# Patient Record
Sex: Female | Born: 1950 | Race: White | Hispanic: No | Marital: Married | Smoking: Never smoker
Health system: Southern US, Community
[De-identification: ages and names within clinical notes are randomized; demographics above are authoritative.]

## PROBLEM LIST (undated history)

## (undated) DIAGNOSIS — D126 Benign neoplasm of colon, unspecified: Secondary | ICD-10-CM

## (undated) DIAGNOSIS — N3010 Interstitial cystitis (chronic) without hematuria: Secondary | ICD-10-CM

## (undated) DIAGNOSIS — K2970 Gastritis, unspecified, without bleeding: Secondary | ICD-10-CM

## (undated) DIAGNOSIS — K8020 Calculus of gallbladder without cholecystitis without obstruction: Secondary | ICD-10-CM

## (undated) DIAGNOSIS — H269 Unspecified cataract: Secondary | ICD-10-CM

## (undated) DIAGNOSIS — M549 Dorsalgia, unspecified: Secondary | ICD-10-CM

## (undated) DIAGNOSIS — E039 Hypothyroidism, unspecified: Secondary | ICD-10-CM

## (undated) DIAGNOSIS — R600 Localized edema: Secondary | ICD-10-CM

## (undated) DIAGNOSIS — K449 Diaphragmatic hernia without obstruction or gangrene: Secondary | ICD-10-CM

## (undated) DIAGNOSIS — M810 Age-related osteoporosis without current pathological fracture: Secondary | ICD-10-CM

## (undated) DIAGNOSIS — K0889 Other specified disorders of teeth and supporting structures: Secondary | ICD-10-CM

## (undated) DIAGNOSIS — D649 Anemia, unspecified: Secondary | ICD-10-CM

## (undated) DIAGNOSIS — M1120 Other chondrocalcinosis, unspecified site: Secondary | ICD-10-CM

## (undated) DIAGNOSIS — M1990 Unspecified osteoarthritis, unspecified site: Secondary | ICD-10-CM

## (undated) DIAGNOSIS — E119 Type 2 diabetes mellitus without complications: Secondary | ICD-10-CM

## (undated) DIAGNOSIS — R3915 Urgency of urination: Secondary | ICD-10-CM

## (undated) DIAGNOSIS — K219 Gastro-esophageal reflux disease without esophagitis: Secondary | ICD-10-CM

## (undated) DIAGNOSIS — I10 Essential (primary) hypertension: Secondary | ICD-10-CM

## (undated) DIAGNOSIS — R0602 Shortness of breath: Secondary | ICD-10-CM

## (undated) DIAGNOSIS — T7840XA Allergy, unspecified, initial encounter: Secondary | ICD-10-CM

## (undated) DIAGNOSIS — M2550 Pain in unspecified joint: Secondary | ICD-10-CM

## (undated) DIAGNOSIS — K5903 Drug induced constipation: Secondary | ICD-10-CM

## (undated) DIAGNOSIS — R7303 Prediabetes: Secondary | ICD-10-CM

## (undated) DIAGNOSIS — E785 Hyperlipidemia, unspecified: Secondary | ICD-10-CM

## (undated) DIAGNOSIS — Z87440 Personal history of urinary (tract) infections: Secondary | ICD-10-CM

## (undated) DIAGNOSIS — K589 Irritable bowel syndrome without diarrhea: Secondary | ICD-10-CM

## (undated) DIAGNOSIS — F419 Anxiety disorder, unspecified: Secondary | ICD-10-CM

## (undated) HISTORY — DX: Calculus of gallbladder without cholecystitis without obstruction: K80.20

## (undated) HISTORY — PX: ANKLE SURGERY: SHX546

## (undated) HISTORY — DX: Unspecified osteoarthritis, unspecified site: M19.90

## (undated) HISTORY — DX: Type 2 diabetes mellitus without complications: E11.9

## (undated) HISTORY — PX: EYE SURGERY: SHX253

## (undated) HISTORY — DX: Unspecified cataract: H26.9

## (undated) HISTORY — DX: Localized edema: R60.0

## (undated) HISTORY — DX: Irritable bowel syndrome, unspecified: K58.9

## (undated) HISTORY — DX: Hypothyroidism, unspecified: E03.9

## (undated) HISTORY — DX: Gastro-esophageal reflux disease without esophagitis: K21.9

## (undated) HISTORY — DX: Hyperlipidemia, unspecified: E78.5

## (undated) HISTORY — DX: Anxiety disorder, unspecified: F41.9

## (undated) HISTORY — DX: Pain in unspecified joint: M25.50

## (undated) HISTORY — DX: Diaphragmatic hernia without obstruction or gangrene: K44.9

## (undated) HISTORY — PX: OTHER SURGICAL HISTORY: SHX169

## (undated) HISTORY — DX: Age-related osteoporosis without current pathological fracture: M81.0

## (undated) HISTORY — DX: Benign neoplasm of colon, unspecified: D12.6

## (undated) HISTORY — DX: Essential (primary) hypertension: I10

## (undated) HISTORY — PX: JOINT REPLACEMENT: SHX530

## (undated) HISTORY — DX: Gastritis, unspecified, without bleeding: K29.70

## (undated) HISTORY — PX: COLONOSCOPY W/ POLYPECTOMY: SHX1380

## (undated) HISTORY — DX: Shortness of breath: R06.02

## (undated) HISTORY — DX: Allergy, unspecified, initial encounter: T78.40XA

## (undated) HISTORY — DX: Dorsalgia, unspecified: M54.9

---

## 1994-05-10 HISTORY — PX: BREAST LUMPECTOMY: SHX2

## 1997-10-31 ENCOUNTER — Ambulatory Visit (HOSPITAL_COMMUNITY): Admission: RE | Admit: 1997-10-31 | Discharge: 1997-10-31 | Payer: Self-pay | Admitting: Gastroenterology

## 2012-10-12 ENCOUNTER — Encounter: Payer: BC Managed Care – PPO | Attending: *Deleted | Admitting: *Deleted

## 2012-10-12 ENCOUNTER — Encounter: Payer: Self-pay | Admitting: *Deleted

## 2012-10-12 VITALS — Ht 61.5 in | Wt 276.0 lb

## 2012-10-12 DIAGNOSIS — Z713 Dietary counseling and surveillance: Secondary | ICD-10-CM | POA: Insufficient documentation

## 2012-10-12 DIAGNOSIS — E119 Type 2 diabetes mellitus without complications: Secondary | ICD-10-CM | POA: Insufficient documentation

## 2012-10-12 NOTE — Patient Instructions (Signed)
Goals:  Follow Diabetes Meal Plan as instructed  Eat 3 meals and 2 snacks, every 3-5 hrs  Limit carbohydrate intake to 30-45 grams carbohydrate/meal  Limit carbohydrate intake to 15 grams carbohydrate/snack  Add lean protein foods to meals/snacks  Monitor glucose levels as instructed by your doctor  Aim for 30 mins of physical activity daily  Bring food record and glucose log to your next nutrition visit 

## 2012-10-12 NOTE — Progress Notes (Signed)
  Patient was seen on 10/12/12 for the first of a series of three diabetes self-management courses at the [facility]. The following learning objectives were met by the patient during this course:   Defines the role of glucose and insulin  Identifies type of diabetes and pathophysiology  Defines the diagnostic criteria for diabetes and prediabetes  States the risk factors for Type 2 Diabetes  States the symptoms of Type 2 Diabetes  Defines Type 2 Diabetes treatment goals  Defines Type 2 Diabetes treatment options  States the rationale for glucose monitoring  Identifies A1C, glucose targets, and testing times  Identifies proper sharps disposal  Defines the purpose of a diabetes food plan  Identifies carbohydrate food groups  Defines effects of carbohydrate foods on glucose levels  Identifies carbohydrate choices/grams/food labels  States benefits of physical activity and effect on glucose  Review of suggested activity guidelines  Handouts given during class include:  Type 2 Diabetes: Basics Book  My Food Plan Book  Food and Activity Log    Follow-Up Plan: Attend core 2 and 3 

## 2012-12-12 ENCOUNTER — Encounter: Payer: BC Managed Care – PPO | Attending: *Deleted

## 2012-12-12 DIAGNOSIS — Z713 Dietary counseling and surveillance: Secondary | ICD-10-CM | POA: Insufficient documentation

## 2012-12-12 DIAGNOSIS — E119 Type 2 diabetes mellitus without complications: Secondary | ICD-10-CM | POA: Insufficient documentation

## 2012-12-13 NOTE — Progress Notes (Signed)
Patient was seen on 12/12/12 for the second of a series of three diabetes self-management courses at the [facility]. The following learning objectives were met by the patient during this course:   Explain basic nutrition maintenance and quality assurance  Describe causes, symptoms and treatment of hypoglycemia and hyperglycemia  Explain how to manage diabetes during illness  Describe the importance of good nutrition for health and healthy eating strategies  List strategies to follow meal plan when dining out  Describe the effects of alcohol on glucose and how to use it safely  Describe problem solving skills for day-to-day glucose challenges  Describe strategies to use when treatment plan needs to change  Identify important factors involved in successful weight loss  Describe ways to remain physically active  Describe the impact of regular activity on insulin resistance  Identify current diabetes medications, their action on blood glucose, and [pssible side effects.  Handouts given in class:  Refrigerator magnet for Sick Day Guidelines  [facility] Oral medication/insulin handout  Your patient has identified their diabetes self-care support plan as:  [facility] support group   Follow-Up Plan: Patient will attend the final class of the ADA Diabetes Self-Care Education.   

## 2012-12-26 ENCOUNTER — Ambulatory Visit: Payer: BC Managed Care – PPO

## 2013-01-23 ENCOUNTER — Encounter: Payer: BC Managed Care – PPO | Attending: *Deleted

## 2013-01-23 DIAGNOSIS — E119 Type 2 diabetes mellitus without complications: Secondary | ICD-10-CM | POA: Insufficient documentation

## 2013-01-23 DIAGNOSIS — Z713 Dietary counseling and surveillance: Secondary | ICD-10-CM | POA: Insufficient documentation

## 2013-01-24 NOTE — Progress Notes (Signed)
  Patient was seen on 01/23/13 for the third of a series of three diabetes self-management courses at the [facility]. The following learning objectives were met by the patient during this course:    Describe how diabetes changes over time   Identify diabetes complications and ways to prevent them   Describe strategies that can promote heart health including lowering blood pressure and cholesterol   Describe strategies to lower dietary fat and sodium in the diet   Identify physical activities that benefit cardiovascular health   Evaluate success in meeting personal goal   Describe the belief that they can live successfully with diabetes day to day   Establish 2-3 goals that they will plan to diligently work on until they return for the 29-month follow-up visit  The following handouts were given in class:  Goal setting handout  Class evaluation form  Your patient has established the following 4 month goals for diabetes self-care:  Count carbohydrates at most meal and snacks  Increase activity at least 3 days a week  Test glucose at least 2 X day, 7 days a week  Follow-Up Plan: Patient will attend a 4 month follow-up visit for diabetes self-management education.

## 2013-02-06 ENCOUNTER — Encounter: Payer: Self-pay | Admitting: *Deleted

## 2013-02-06 ENCOUNTER — Encounter: Payer: BC Managed Care – PPO | Admitting: *Deleted

## 2013-02-06 DIAGNOSIS — E119 Type 2 diabetes mellitus without complications: Secondary | ICD-10-CM

## 2013-02-06 NOTE — Progress Notes (Signed)
  Medical Nutrition Therapy:  Appt start time: 1200 end time:  1230.  Assessment:  Primary concerns today: Jessica Good is here for a follow up appointment pertaining to prediabetes.  She attended our 3 Core Class sessions for diabetes self-management education, but she still has questions about meal planning.  She is trying to count her carbohydrates, but she isn't doing it correctly.  She also isn't active any more   MEDICATIONS: see list   DIETARY INTAKE:  Usual eating pattern includes 2-3 meals and 1-3 snacks per day.  Everyday foods include protein, starch, fruit, some vegetables.  Avoided foods include none.    24-hr recall:  B ( AM): peanut butter toast or cheese toast.  1/2 apple; hard boiled eggs  Snk ( AM): maybe apple and peanut butter  L ( PM): greens salad with chicken and balsamic;   Grilled pimento cheese sandwich Snk ( PM): maybe some nabs D ( PM): chicken and rice with peas and corn Snk ( PM): cookies and peanut butter Beverages: water and coffee  Usual physical activity: PT for knee.  Did some pool exercise twice a week.  Hasn't done anything lately  Estimated energy needs: 1600 calories 180 g carbohydrates 120 g protein 44 g fat  Progress Towards Goal(s):  In progress.   Nutritional Diagnosis:  NB-1.6 Limited adherence to nutrition-related recommendations As related to diabetes meal plan.  As evidenced by dietary recall and limited physical activity.    Intervention:  Nutrition counseling provided. Reviewed carb counting and what foods contain carbohydrates.  Built meal plan consisting of 3 carb choice/meal and 1-2/snack.  Reminded Jessica Good of the importance of lean protein with all meals, as well as free vegetables.  Discussed reading food labels for sugar and fiber.   Handouts given during visit include:  My meal plan card  Reading food labels  Carb counting worksheet  Monitoring/Evaluation:  Dietary intake, exercise, BGM, and body weight in 3 month(s).

## 2013-05-22 ENCOUNTER — Ambulatory Visit: Payer: BC Managed Care – PPO | Admitting: *Deleted

## 2014-02-11 ENCOUNTER — Encounter: Payer: Self-pay | Admitting: *Deleted

## 2014-02-13 ENCOUNTER — Encounter: Payer: Self-pay | Admitting: Internal Medicine

## 2014-02-13 ENCOUNTER — Ambulatory Visit (INDEPENDENT_AMBULATORY_CARE_PROVIDER_SITE_OTHER): Payer: BC Managed Care – PPO | Admitting: Internal Medicine

## 2014-02-13 VITALS — BP 136/80 | HR 76 | Ht 61.0 in | Wt 255.4 lb

## 2014-02-13 DIAGNOSIS — K5909 Other constipation: Secondary | ICD-10-CM

## 2014-02-13 DIAGNOSIS — Z1211 Encounter for screening for malignant neoplasm of colon: Secondary | ICD-10-CM

## 2014-02-13 DIAGNOSIS — K219 Gastro-esophageal reflux disease without esophagitis: Secondary | ICD-10-CM

## 2014-02-13 DIAGNOSIS — T402X5A Adverse effect of other opioids, initial encounter: Secondary | ICD-10-CM | POA: Insufficient documentation

## 2014-02-13 DIAGNOSIS — K5903 Drug induced constipation: Secondary | ICD-10-CM

## 2014-02-13 MED ORDER — MOVIPREP 100 G PO SOLR: 1.0000 | Freq: Once | ORAL | Status: DC

## 2014-02-13 MED ORDER — LUBIPROSTONE 24 MCG PO CAPS: 24.0000 ug | ORAL_CAPSULE | Freq: Two times a day (BID) | ORAL | Status: DC

## 2014-02-13 NOTE — Progress Notes (Signed)
Patient ID: Jessica Good, female   DOB: May 26, 1950, 63 y.o.   MRN: [id number] HPI: Jessica Good is a 63 year old female with a past medical history of hypertension, diabetes, anxiety and osteoarthritis who is seen in consultation at the request of Everardo Beals to evaluate constipation. She is here alone today. She reports that over the last 3-4 months she's developed severe constipation. She is having approximately one bowel movement a week which is often hard requiring straining. She describes her stool as "small balls". Prior to that she was having approximately one bowel movement per day. She does report a history of irritable bowel. She is taking oxymorphone extended release 4 osteoarthritis/knee pain which is significant. She's also receiving steroid injections to her knees and is trying to lose weight so that she can have a knee replacement surgery. She's tried over-the-counter stool softeners for constipation without benefit. She's had a colonoscopy though it's been greater than 10 years ago. Denies family history of colorectal cancer though a parent had polyps. She denies rectal bleeding or melena. She does have nausea which she attributes to her medication but no vomiting. She has a history of heartburn related to reflux disease which is well controlled on omeprazole. She denies dysphagia or odynophagia. No weight loss in fact some weight gain. No hepatobiliary complaints. She denies abdominal pain currently though very occasionally less than once a month she'll have left upper pain which is fleeting and short-lived.   Past Medical History  Diagnosis Date  . Diabetes mellitus without complication   . Hypertension   . Anxiety   . Osteoarthritis     Past Surgical History  Procedure Laterality Date  . Ankle surgery      Outpatient Prescriptions Prior to Visit  Medication Sig Dispense Refill  . cholecalciferol (VITAMIN D) 1000 UNITS tablet Take 1,000 Units by mouth daily.      .  cyclobenzaprine (FLEXERIL) 10 MG tablet Take 10 mg by mouth 3 (three) times daily as needed for muscle spasms.      . furosemide (LASIX) 40 MG tablet Take 40 mg by mouth daily.      Marland Kitchen lisinopril (PRINIVIL,ZESTRIL) 20 MG tablet Take 20 mg by mouth daily.      . Multiple Vitamin (MULTIVITAMIN WITH MINERALS) TABS Take 1 tablet by mouth daily.      Marland Kitchen omeprazole (PRILOSEC) 10 MG capsule Take 10 mg by mouth daily.      . ciprofloxacin (CIPRO) 500 MG/5ML (10%) suspension Take by mouth 2 (two) times daily.      . diclofenac (VOLTAREN) 75 MG EC tablet Take 75 mg by mouth 2 (two) times daily.      . traMADol (ULTRAM) 50 MG tablet Take 50 mg by mouth every 6 (six) hours as needed for pain.       No facility-administered medications prior to visit.    Allergies  Allergen Reactions  . Sulfa Antibiotics     Family History  Problem Relation Age of Onset  . Diabetes Maternal Grandmother   . Hypertension Father     History  Substance Use Topics  . Smoking status: Never Smoker   . Smokeless tobacco: Not on file  . Alcohol Use: No    ROS: As per history of present illness, otherwise negative  BP 136/80  Pulse 76  Ht 5\' 1"  (1.549 m)  Wt 255 lb 6.4 oz (115.849 kg)  BMI 48.28 kg/m2  SpO2 97% Constitutional: Well-developed and well-nourished. No distress. HEENT: Normocephalic and atraumatic. Oropharynx  is clear and moist. No oropharyngeal exudate. Conjunctivae are normal.  No scleral icterus. Neck: Neck supple. Trachea midline. Cardiovascular: Normal rate, regular rhythm and intact distal pulses. No M/R/G Pulmonary/chest: Effort normal and breath sounds normal. No wheezing, rales or rhonchi. Abdominal: Soft, obese, nontender, nondistended. Bowel sounds active throughout.  Extremities: no clubbing, cyanosis, or edema Lymphadenopathy: No cervical adenopathy noted. Neurological: Alert and oriented to person place and time. Psychiatric: Normal mood and affect. Behavior is normal.  RELEVANT  LABS  Labs reviewed CMP dated 12/20/2013 within normal limits, CBC within normal limits hemoglobin 13.3, MCV 93.   thyroid panel normal 10/25/2013 A1c 6.3  Records reviewed from primary care  ASSESSMENT/PLAN: 63 year old female with a past medical history of hypertension, diabetes, anxiety and osteoarthritis who is seen in consultation at the request of Everardo Beals to evaluate constipation.   1. Constipation, opioid-induced  -- her constipation is very like her later to chronic narcotic use. We discussed this today and I have recommended lubiprostone 24 mcg twice daily. We discussed this medication including as possible side effect of diarrhea. I asked that she notify me should this occur.she voices understanding.  There are other options if lubiprostone is ineffective. Thyroid function panel checked and within normal limits   2. CRC screening -- she is due for colonoscopy for colorectal cancer screening. This test is recommended and after discussion of the risks and benefits she is agreeable to proceed  3. GERD -- well-controlled once daily PPI. No alarm symptoms. She will continue current dose of omeprazole.

## 2014-02-13 NOTE — Patient Instructions (Signed)
You have been scheduled for a colonoscopy. Please follow written instructions given to you at your visit today.  Please pick up your prep kit at the pharmacy within the next 1-3 days. If you use inhalers (even only as needed), please bring them with you on the day of your procedure. Your physician has requested that you go to www.startemmi.com and enter the access code given to you at your visit today. This web site gives a general overview about your procedure. However, you should still follow specific instructions given to you by our office regarding your preparation for the procedure.  CC:Dr [employer]

## 2014-02-19 ENCOUNTER — Encounter: Payer: Self-pay | Admitting: Internal Medicine

## 2014-03-27 ENCOUNTER — Telehealth: Payer: Self-pay | Admitting: Internal Medicine

## 2014-03-27 NOTE — H&P (Signed)
  [facility] [address] [place], [place] [address] [phone] A Division of [facility]  Ninetta Lights, M.D.   Robert A. Noemi Chapel, M.D.   Faythe Casa, M.D.   Johnny Bridge, M.D.   Almedia Balls, M.D Ernesta Amble. Percell Miller, M.D.  Joseph Pierini, M.D.  Lanier Prude, M.D.    Verner Chol, M.D. Mary L. Fenton Malling, PA-C  Kirstin A. Shepperson, PA-C  Josh Maywood, PA-C [place], [place]   RE: Jessica Good, Jessica Good   [id number]      DOB: November 06, 1950 PROGRESS NOTE: 02-25-14 Reason for visit:  Referral from Dr. Lynann Bologna for right knee arthritis. 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 She has had multiple cortisone shots NSAID's pain medications bracing and viscosupplementation. She has also lost 30 pounds in attempt to improve pain.   Please see associated documentation for this clinic visit for further past medical, family, surgical and social history, review of systems, and exam findings as this was reviewed by me.  EXAMINATION: Well appearing female in no apparent distress. Significant tenderness primarily over the lateral joint line but has tenderness on the medial side as well. She has a roughly 5-10 degree flexion contracture.  She is neurovascularly intact distally. Skin is benign.  IMAGING: X-rays reviewed by me:  2 views demonstrate severe right knee arthritis primarily effecting the lateral compartment.   ASSESSMENT/PLAN: I had a long talk with her about options. Discussed risks benefits and possible complications of total knee arthroplasty and she would like to go forward with this. We will have to work hard on getting her extension and likely do a 10 mm. bone cut initially.  Ernesta Amble.  Percell Miller, M.D.  Electronically verified by Ernesta Amble. Percell Miller, M.D. TDM:kah D 02-25-14 T  02-27-14

## 2014-03-27 NOTE — Telephone Encounter (Signed)
I have spoken to patient and have answered questions. She has found her paperwork that she originally lost so a lot of her questions have already been answered.

## 2014-03-29 ENCOUNTER — Encounter: Payer: Self-pay | Admitting: Internal Medicine

## 2014-03-29 ENCOUNTER — Ambulatory Visit (AMBULATORY_SURGERY_CENTER): Payer: BC Managed Care – PPO | Admitting: Internal Medicine

## 2014-03-29 VITALS — BP 171/80 | HR 66 | Temp 96.6°F | Resp 17 | Ht 60.0 in | Wt 255.0 lb

## 2014-03-29 DIAGNOSIS — D123 Benign neoplasm of transverse colon: Secondary | ICD-10-CM

## 2014-03-29 DIAGNOSIS — Z1211 Encounter for screening for malignant neoplasm of colon: Secondary | ICD-10-CM

## 2014-03-29 MED ORDER — SODIUM CHLORIDE 0.9 % IV SOLN: 500.0000 mL | INTRAVENOUS | Status: DC

## 2014-03-29 NOTE — Progress Notes (Signed)
Called to room to assist during endoscopic procedure.  Patient ID and intended procedure confirmed with present staff. Received instructions for my participation in the procedure from the performing physician.  

## 2014-03-29 NOTE — Op Note (Signed)
[name]  [address] [place], [address]   COLONOSCOPY PROCEDURE REPORT  PATIENT: Jessica Good, Jessica Good  MR#: [id number] BIRTHDATE: [birth year] , 19  yrs. old GENDER: female ENDOSCOPIST: Jerene Bears, MD PROCEDURE DATE:  03/29/2014 PROCEDURE:   Colonoscopy with snare polypectomy First Screening Colonoscopy - Avg.  risk and is 50 yrs.  old or older - No.  Prior Negative Screening - Now for repeat screening. 10 or more years since last screening  History of Adenoma - Now for follow-up colonoscopy & has been > or = to 3 yrs.  N/A  Polyps Removed Today? Yes. ASA CLASS:   Class III INDICATIONS:average risk for colorectal cancer. MEDICATIONS: Monitored anesthesia care and Propofol 240 mg IV  DESCRIPTION OF PROCEDURE:   After the risks benefits and alternatives of the procedure were thoroughly explained, informed consent was obtained.  The digital rectal exam revealed no rectal mass.   The LB [id number]  endoscope was introduced through the anus and advanced to the cecum, which was identified by both the appendix and ileocecal valve. No adverse events experienced. The quality of the prep was good, using MoviPrep  The instrument was then slowly withdrawn as the colon was fully examined.   COLON FINDINGS: Two sessile polyps ranging between 3-55mm in size were found in the transverse colon.   The examination was otherwise normal.  Retroflexed views revealed no abnormalities. The time to cecum=3 minutes 34 seconds.  Withdrawal time=8 minutes 41 seconds. The scope was withdrawn and the procedure completed. COMPLICATIONS: There were no immediate complications.  ENDOSCOPIC IMPRESSION: 1.   Two sessile polyps ranging between 3-38mm in size were found in the transverse colon 2.   The examination was otherwise normal  RECOMMENDATIONS: 1.  Await pathology results 2.  If the polyps removed today are proven to be adenomatous (pre-cancerous) polyps, you will need a repeat  colonoscopy in 5 years.  Otherwise you should continue to follow colorectal cancer screening guidelines for "routine risk" patients with colonoscopy in 10 years.  You will receive a letter within 1-2 weeks with the results of your biopsy as well as final recommendations.  Please call my office if you have not received a letter after 3 weeks.  eSigned:  Jerene Bears, MD 03/29/2014 4:36 PM   cc: The Patient, Dr. Benna Dunks, MD

## 2014-03-29 NOTE — Progress Notes (Signed)
Procedure ends, to recovery, report given and VSS. 

## 2014-03-29 NOTE — Patient Instructions (Signed)
YOU HAD AN ENDOSCOPIC PROCEDURE TODAY AT THE [facility]: Refer to the procedure report that was given to you for any specific questions about what was found during the examination.  If the procedure report does not answer your questions, please call your gastroenterologist to clarify.  If you requested that your care partner not be given the details of your procedure findings, then the procedure report has been included in a sealed envelope for you to review at your convenience later.  YOU SHOULD EXPECT: Some feelings of bloating in the abdomen. Passage of more gas than usual.  Walking can help get rid of the air that was put into your GI tract during the procedure and reduce the bloating. If you had a lower endoscopy (such as a colonoscopy or flexible sigmoidoscopy) you may notice spotting of blood in your stool or on the toilet paper. If you underwent a bowel prep for your procedure, then you may not have a normal bowel movement for a few days.  DIET: Your first meal following the procedure should be a light meal and then it is ok to progress to your normal diet.  A half-sandwich or bowl of soup is an example of a good first meal.  Heavy or fried foods are harder to digest and may make you feel nauseous or bloated.  Likewise meals heavy in dairy and vegetables can cause extra gas to form and this can also increase the bloating.  Drink plenty of fluids but you should avoid alcoholic beverages for 24 hours.  ACTIVITY: Your care partner should take you home directly after the procedure.  You should plan to take it easy, moving slowly for the rest of the day.  You can resume normal activity the day after the procedure however you should NOT DRIVE or use heavy machinery for 24 hours (because of the sedation medicines used during the test).    SYMPTOMS TO REPORT IMMEDIATELY: A gastroenterologist can be reached at any hour.  During normal business hours, 8:30 AM to 5:00 PM Monday through Friday,  call [phone].  After hours and on weekends, please call the GI answering service at [phone] who will take a message and have the physician on call contact you.   Following lower endoscopy (colonoscopy or flexible sigmoidoscopy):  Excessive amounts of blood in the stool  Significant tenderness or worsening of abdominal pains  Swelling of the abdomen that is new, acute  Fever of 100F or higher FOLLOW UP: If any biopsies were taken you will be contacted by phone or by letter within the next 1-3 weeks.  Call your gastroenterologist if you have not heard about the biopsies in 3 weeks.  Our staff will call the home number listed on your records the next business day following your procedure to check on you and address any questions or concerns that you may have at that time regarding the information given to you following your procedure. This is a courtesy call and so if there is no answer at the home number and we have not heard from you through the emergency physician on call, we will assume that you have returned to your regular daily activities without incident.  SIGNATURES/CONFIDENTIALITY: You and/or your care partner have signed paperwork which will be entered into your electronic medical record.  These signatures attest to the fact that that the information above on your After Visit Summary has been reviewed and is understood.  Full responsibility of the confidentiality of this discharge   information lies with you and/or your care-partner.  Polyp information given. 

## 2014-03-30 NOTE — Progress Notes (Signed)
Patient called about rescheduling PAT appointment d/t potential need to go out of town but that she may be able to get back in time for appointment. Give patient number to Carin Hock PAT scheduler also advised patient that due to holiday next week we may or may not be able to reschedule her appointment for PAT and that based off of surgery that we need to get her in for this. Patient verbalized she will call at 08:30 on Monday regarding same.

## 2014-04-01 ENCOUNTER — Inpatient Hospital Stay (HOSPITAL_COMMUNITY)
Admission: RE | Admit: 2014-04-01 | Discharge: 2014-04-01 | Disposition: A | Discharge status: Home / Self Care | Payer: BC Managed Care – PPO | Source: Ambulatory Visit

## 2014-04-01 ENCOUNTER — Telehealth: Payer: Self-pay | Admitting: *Deleted

## 2014-04-01 NOTE — Telephone Encounter (Signed)
  Follow up Call-  Call back number 03/29/2014  Post procedure Call Back phone  # [phone]  Permission to leave phone message Yes    [facility]

## 2014-04-03 ENCOUNTER — Encounter (HOSPITAL_COMMUNITY): Payer: Self-pay

## 2014-04-03 ENCOUNTER — Encounter (HOSPITAL_COMMUNITY)
Admission: RE | Admit: 2014-04-03 | Discharge: 2014-04-03 | Disposition: A | Discharge status: Home / Self Care | Payer: BC Managed Care – PPO | Source: Ambulatory Visit | Attending: Orthopedic Surgery | Admitting: Orthopedic Surgery

## 2014-04-03 DIAGNOSIS — Z01812 Encounter for preprocedural laboratory examination: Secondary | ICD-10-CM | POA: Insufficient documentation

## 2014-04-03 HISTORY — DX: Urgency of urination: R39.15

## 2014-04-03 HISTORY — DX: Drug induced constipation: K59.03

## 2014-04-03 HISTORY — DX: Localized edema: R60.0

## 2014-04-03 LAB — BASIC METABOLIC PANEL
Anion gap: 11 (ref 5–15)
BUN: 10 mg/dL (ref 6–23)
CHLORIDE: 101 meq/L (ref 96–112)
CO2: 28 meq/L (ref 19–32)
CREATININE: 0.65 mg/dL (ref 0.50–1.10)
Calcium: 9.5 mg/dL (ref 8.4–10.5)
GFR calc non Af Amer: >90 mL/min (ref >90)
Glucose, Bld: 122 mg/dL — ABNORMAL HIGH (ref 70–99)
Potassium: 4 mEq/L (ref 3.7–5.3)
Sodium: 140 mEq/L (ref 137–147)

## 2014-04-03 LAB — URINALYSIS, ROUTINE W REFLEX MICROSCOPIC
Bilirubin Urine: NEGATIVE
Glucose, UA: NEGATIVE mg/dL
Hgb urine dipstick: NEGATIVE
Ketones, ur: NEGATIVE mg/dL
NITRITE: NEGATIVE
PH: 6.5 (ref 5.0–8.0)
Protein, ur: NEGATIVE mg/dL
SPECIFIC GRAVITY, URINE: 1.009 (ref 1.005–1.030)
Urobilinogen, UA: 0.2 mg/dL (ref 0.0–1.0)

## 2014-04-03 LAB — CBC
HEMATOCRIT: 40.4 % (ref 36.0–46.0)
HEMOGLOBIN: 13.3 g/dL (ref 12.0–15.0)
MCH: 30.1 pg (ref 26.0–34.0)
MCHC: 32.9 g/dL (ref 30.0–36.0)
MCV: 91.4 fL (ref 78.0–100.0)
Platelets: 222 10*3/uL (ref 150–400)
RBC: 4.42 MIL/uL (ref 3.87–5.11)
RDW: 12.9 % (ref 11.5–15.5)
WBC: 5 10*3/uL (ref 4.0–10.5)

## 2014-04-03 LAB — TYPE AND SCREEN
ABO/RH(D): B POS
Antibody Screen: NEGATIVE

## 2014-04-03 LAB — URINE MICROSCOPIC-ADD ON

## 2014-04-03 LAB — SURGICAL PCR SCREEN
MRSA, PCR: NEGATIVE
STAPHYLOCOCCUS AUREUS: NEGATIVE

## 2014-04-03 LAB — PROTIME-INR
INR: 1.05 (ref 0.00–1.49)
Prothrombin Time: 13.8 seconds (ref 11.6–15.2)

## 2014-04-03 LAB — ABO/RH: ABO/RH(D): B POS

## 2014-04-03 NOTE — Pre-Procedure Instructions (Signed)
Jessica Good  04/03/2014   Your procedure is scheduled on:  Tuesday April 09, 2014 at 8:00 AM.  Report to [facility] Admitting at 5:30 AM.  Call this number if you have problems the morning of surgery: [phone]   Remember:   Do not eat food or drink liquids after midnight.   Take these medicines the morning of surgery with A SIP OF WATER: Omeprazole (Prilosec)    Please stop taking and herbal medications (Ex. Vitamin D, Multivitamin, Advil, Motrin, Alleve, etc)   Do not wear jewelry, make-up or nail polish.  Do not wear lotions, powders, or perfumes.  Do not shave 48 hours prior to surgery.   Do not bring valuables to the hospital.  [facility] is not responsible for any belongings or valuables.               Contacts, dentures or bridgework may not be worn into surgery.  Leave suitcase in the car. After surgery it may be brought to your room.  For patients admitted to the hospital, discharge time is determined by your treatment team.               Patients discharged the day of surgery will not be allowed to drive home.  Name and phone number of your driver:   Special Instructions: Shower using CHG soap the night before and the morning of your surgery   Please read over the following fact sheets that you were given: Pain Booklet, Coughing and Deep Breathing, Blood Transfusion Information, Total Joint Packet, MRSA Information and Surgical Site Infection Prevention

## 2014-04-03 NOTE — Progress Notes (Signed)
PCP is Genevieve Norlander, NP. Patient informed Nurse that she recently had a stress test and EKG with Dr. Einar Gip. Will request records. Patient denied having a cardiac cath, sleep study, chest discomfort or shortness of breath.

## 2014-04-05 LAB — URINE CULTURE

## 2014-04-08 MED ORDER — SODIUM CHLORIDE 0.9 % IV SOLN
1000.0000 mg | INTRAVENOUS | Status: AC
  Administered 2014-04-09: 1000 mg via INTRAVENOUS
  Filled 2014-04-08: qty 10

## 2014-04-08 MED ORDER — ACETAMINOPHEN 500 MG PO TABS
1000.0000 mg | ORAL_TABLET | Freq: Once | ORAL | Status: AC
  Administered 2014-04-09: 1000 mg via ORAL
  Filled 2014-04-08: qty 2

## 2014-04-08 MED ORDER — CEFAZOLIN SODIUM-DEXTROSE 2-3 GM-% IV SOLR
2.0000 g | INTRAVENOUS | Status: AC
  Administered 2014-04-09: 2 g via INTRAVENOUS
  Filled 2014-04-08: qty 50

## 2014-04-08 MED ORDER — DEXTROSE-NACL 5-0.45 % IV SOLN: 100.0000 mL/h | INTRAVENOUS | Status: DC

## 2014-04-09 ENCOUNTER — Encounter (HOSPITAL_COMMUNITY)
Admission: RE | Disposition: A | Discharge status: Home / Self Care | Payer: Self-pay | Source: Ambulatory Visit | Attending: Orthopedic Surgery

## 2014-04-09 ENCOUNTER — Encounter (HOSPITAL_COMMUNITY): Payer: Self-pay | Admitting: *Deleted

## 2014-04-09 ENCOUNTER — Inpatient Hospital Stay (HOSPITAL_COMMUNITY)
Admission: RE | Admit: 2014-04-09 | Discharge: 2014-04-11 | DRG: 470 | Disposition: A | Discharge status: Home / Self Care | Payer: BC Managed Care – PPO | Source: Ambulatory Visit | Attending: Orthopedic Surgery | Admitting: Orthopedic Surgery

## 2014-04-09 ENCOUNTER — Encounter: Payer: Self-pay | Admitting: Internal Medicine

## 2014-04-09 ENCOUNTER — Inpatient Hospital Stay (HOSPITAL_COMMUNITY): Payer: BC Managed Care – PPO

## 2014-04-09 ENCOUNTER — Inpatient Hospital Stay (HOSPITAL_COMMUNITY): Payer: BC Managed Care – PPO | Admitting: Certified Registered"

## 2014-04-09 DIAGNOSIS — F419 Anxiety disorder, unspecified: Secondary | ICD-10-CM | POA: Diagnosis present

## 2014-04-09 DIAGNOSIS — E119 Type 2 diabetes mellitus without complications: Secondary | ICD-10-CM | POA: Diagnosis present

## 2014-04-09 DIAGNOSIS — K219 Gastro-esophageal reflux disease without esophagitis: Secondary | ICD-10-CM | POA: Diagnosis present

## 2014-04-09 DIAGNOSIS — M1711 Unilateral primary osteoarthritis, right knee: Principal | ICD-10-CM | POA: Diagnosis present

## 2014-04-09 DIAGNOSIS — I10 Essential (primary) hypertension: Secondary | ICD-10-CM | POA: Diagnosis present

## 2014-04-09 DIAGNOSIS — Z9889 Other specified postprocedural states: Secondary | ICD-10-CM

## 2014-04-09 DIAGNOSIS — M1710 Unilateral primary osteoarthritis, unspecified knee: Secondary | ICD-10-CM | POA: Diagnosis present

## 2014-04-09 HISTORY — PX: TOTAL KNEE ARTHROPLASTY: SHX125

## 2014-04-09 LAB — GLUCOSE, CAPILLARY
Glucose-Capillary: 166 mg/dL — ABNORMAL HIGH (ref 70–99)
Glucose-Capillary: 218 mg/dL — ABNORMAL HIGH (ref 70–99)
Glucose-Capillary: 131 mg/dL — ABNORMAL HIGH (ref 70–99)

## 2014-04-09 SURGERY — ARTHROPLASTY, KNEE, TOTAL
Anesthesia: General | Site: Knee | Laterality: Right

## 2014-04-09 SURGERY — ARTHROPLASTY, KNEE, TOTAL
Anesthesia: Regional | Site: Knee | Laterality: Right

## 2014-04-09 MED ORDER — ONDANSETRON HCL 4 MG/2ML IJ SOLN: 4.0000 mg | Freq: Four times a day (QID) | INTRAMUSCULAR | Status: DC | PRN

## 2014-04-09 MED ORDER — MIDAZOLAM HCL 5 MG/5ML IJ SOLN
INTRAMUSCULAR | Status: DC | PRN
  Administered 2014-04-09: 2 mg via INTRAVENOUS

## 2014-04-09 MED ORDER — DOCUSATE SODIUM 100 MG PO CAPS
100.0000 mg | ORAL_CAPSULE | Freq: Two times a day (BID) | ORAL | Status: DC
  Administered 2014-04-09 – 2014-04-10 (×3): 100 mg via ORAL
  Filled 2014-04-09 (×5): qty 1

## 2014-04-09 MED ORDER — LIDOCAINE HCL (CARDIAC) 20 MG/ML IV SOLN
INTRAVENOUS | Status: AC
  Filled 2014-04-09: qty 5

## 2014-04-09 MED ORDER — ONDANSETRON HCL 4 MG/2ML IJ SOLN
INTRAMUSCULAR | Status: DC | PRN
Start: 2014-04-09 — End: 2014-04-09
  Administered 2014-04-09: 4 mg via INTRAVENOUS

## 2014-04-09 MED ORDER — BUPIVACAINE LIPOSOME 1.3 % IJ SUSP
20.0000 mL | INTRAMUSCULAR | Status: DC
  Filled 2014-04-09: qty 20

## 2014-04-09 MED ORDER — METHOCARBAMOL 500 MG PO TABS
ORAL_TABLET | ORAL | Status: AC
  Filled 2014-04-09: qty 1

## 2014-04-09 MED ORDER — FUROSEMIDE 40 MG PO TABS
40.0000 mg | ORAL_TABLET | Freq: Every day | ORAL | Status: DC | PRN
  Filled 2014-04-09: qty 1

## 2014-04-09 MED ORDER — HYDROMORPHONE HCL 1 MG/ML IJ SOLN
0.2500 mg | INTRAMUSCULAR | Status: DC | PRN
  Administered 2014-04-09 (×4): 0.5 mg via INTRAVENOUS

## 2014-04-09 MED ORDER — BUPIVACAINE-EPINEPHRINE (PF) 0.5% -1:200000 IJ SOLN
INTRAMUSCULAR | Status: DC | PRN
  Administered 2014-04-09: 30 mL via PERINEURAL

## 2014-04-09 MED ORDER — ASPIRIN EC 325 MG PO TBEC
325.0000 mg | DELAYED_RELEASE_TABLET | Freq: Every day | ORAL | Status: DC
  Administered 2014-04-10 – 2014-04-11 (×2): 325 mg via ORAL
  Filled 2014-04-09 (×3): qty 1

## 2014-04-09 MED ORDER — OXYCODONE HCL 5 MG PO TABS
5.0000 mg | ORAL_TABLET | Freq: Once | ORAL | Status: AC | PRN
  Administered 2014-04-09: 5 mg via ORAL

## 2014-04-09 MED ORDER — ONDANSETRON HCL 4 MG/2ML IJ SOLN
INTRAMUSCULAR | Status: AC
  Filled 2014-04-09: qty 2

## 2014-04-09 MED ORDER — ONDANSETRON HCL 4 MG PO TABS: 4.0000 mg | ORAL_TABLET | Freq: Three times a day (TID) | ORAL | Status: DC | PRN

## 2014-04-09 MED ORDER — OXYCODONE HCL 5 MG PO TABS
ORAL_TABLET | ORAL | Status: AC
  Filled 2014-04-09: qty 1

## 2014-04-09 MED ORDER — MORPHINE SULFATE 2 MG/ML IJ SOLN
2.0000 mg | INTRAMUSCULAR | Status: DC | PRN
  Administered 2014-04-09 – 2014-04-10 (×7): 2 mg via INTRAVENOUS
  Filled 2014-04-09 (×8): qty 1

## 2014-04-09 MED ORDER — METHOCARBAMOL 500 MG PO TABS
500.0000 mg | ORAL_TABLET | Freq: Four times a day (QID) | ORAL | Status: DC | PRN
  Administered 2014-04-09 – 2014-04-11 (×6): 500 mg via ORAL
  Filled 2014-04-09 (×8): qty 1

## 2014-04-09 MED ORDER — ONDANSETRON HCL 4 MG/2ML IJ SOLN
4.0000 mg | Freq: Four times a day (QID) | INTRAMUSCULAR | Status: DC | PRN
  Filled 2014-04-09: qty 2

## 2014-04-09 MED ORDER — FENTANYL CITRATE 0.05 MG/ML IJ SOLN
INTRAMUSCULAR | Status: AC
  Filled 2014-04-09: qty 5

## 2014-04-09 MED ORDER — MIDAZOLAM HCL 2 MG/2ML IJ SOLN
INTRAMUSCULAR | Status: AC
  Filled 2014-04-09: qty 2

## 2014-04-09 MED ORDER — LISINOPRIL 20 MG PO TABS
20.0000 mg | ORAL_TABLET | Freq: Every day | ORAL | Status: DC
  Administered 2014-04-09 – 2014-04-11 (×3): 20 mg via ORAL
  Filled 2014-04-09 (×3): qty 1

## 2014-04-09 MED ORDER — CELECOXIB 200 MG PO CAPS
200.0000 mg | ORAL_CAPSULE | Freq: Two times a day (BID) | ORAL | Status: DC
  Administered 2014-04-10 – 2014-04-11 (×2): 200 mg via ORAL
  Filled 2014-04-09 (×7): qty 1

## 2014-04-09 MED ORDER — MENTHOL 3 MG MT LOZG: 1.0000 | LOZENGE | OROMUCOSAL | Status: DC | PRN

## 2014-04-09 MED ORDER — DEXAMETHASONE SODIUM PHOSPHATE 4 MG/ML IJ SOLN
INTRAMUSCULAR | Status: AC
  Filled 2014-04-09: qty 1

## 2014-04-09 MED ORDER — DEXAMETHASONE SODIUM PHOSPHATE 4 MG/ML IJ SOLN
INTRAMUSCULAR | Status: DC | PRN
  Administered 2014-04-09: 4 mg via INTRAVENOUS

## 2014-04-09 MED ORDER — CHLORHEXIDINE GLUCONATE 4 % EX LIQD
60.0000 mL | Freq: Once | CUTANEOUS | Status: DC
  Filled 2014-04-09: qty 60

## 2014-04-09 MED ORDER — ASPIRIN EC 325 MG PO TBEC: 325.0000 mg | DELAYED_RELEASE_TABLET | Freq: Every day | ORAL | Status: DC

## 2014-04-09 MED ORDER — LUBIPROSTONE 24 MCG PO CAPS
24.0000 ug | ORAL_CAPSULE | Freq: Two times a day (BID) | ORAL | Status: DC
  Filled 2014-04-09 (×6): qty 1

## 2014-04-09 MED ORDER — METOCLOPRAMIDE HCL 5 MG/ML IJ SOLN
5.0000 mg | Freq: Three times a day (TID) | INTRAMUSCULAR | Status: DC | PRN
  Filled 2014-04-09: qty 2

## 2014-04-09 MED ORDER — SODIUM CHLORIDE 0.9 % IR SOLN
Status: DC | PRN
  Administered 2014-04-09: 1000 mL

## 2014-04-09 MED ORDER — ONDANSETRON HCL 4 MG PO TABS
4.0000 mg | ORAL_TABLET | Freq: Four times a day (QID) | ORAL | Status: DC | PRN
  Filled 2014-04-09: qty 1

## 2014-04-09 MED ORDER — PROPOFOL 10 MG/ML IV BOLUS
INTRAVENOUS | Status: AC
  Filled 2014-04-09: qty 20

## 2014-04-09 MED ORDER — DEXTROSE-NACL 5-0.45 % IV SOLN
INTRAVENOUS | Status: AC
  Administered 2014-04-09: 20:00:00 via INTRAVENOUS

## 2014-04-09 MED ORDER — SUCCINYLCHOLINE CHLORIDE 20 MG/ML IJ SOLN
INTRAMUSCULAR | Status: AC
  Filled 2014-04-09: qty 1

## 2014-04-09 MED ORDER — LACTATED RINGERS IV SOLN
INTRAVENOUS | Status: DC | PRN
  Administered 2014-04-09 (×2): via INTRAVENOUS

## 2014-04-09 MED ORDER — LIDOCAINE HCL (CARDIAC) 20 MG/ML IV SOLN
INTRAVENOUS | Status: DC | PRN
  Administered 2014-04-09: 70 mg via INTRAVENOUS

## 2014-04-09 MED ORDER — BUPIVACAINE LIPOSOME 1.3 % IJ SUSP
INTRAMUSCULAR | Status: DC | PRN
  Administered 2014-04-09: 60 mL

## 2014-04-09 MED ORDER — PHENOL 1.4 % MT LIQD
1.0000 | OROMUCOSAL | Status: DC | PRN
Start: 2014-04-09 — End: 2014-04-11

## 2014-04-09 MED ORDER — ROCURONIUM BROMIDE 100 MG/10ML IV SOLN
INTRAVENOUS | Status: DC | PRN
  Administered 2014-04-09: 50 mg via INTRAVENOUS

## 2014-04-09 MED ORDER — PROPOFOL 10 MG/ML IV BOLUS
INTRAVENOUS | Status: DC | PRN
  Administered 2014-04-09: 170 mg via INTRAVENOUS

## 2014-04-09 MED ORDER — ACETAMINOPHEN 650 MG RE SUPP: 650.0000 mg | Freq: Four times a day (QID) | RECTAL | Status: DC | PRN

## 2014-04-09 MED ORDER — DEXAMETHASONE SODIUM PHOSPHATE 10 MG/ML IJ SOLN
10.0000 mg | Freq: Once | INTRAMUSCULAR | Status: AC
  Administered 2014-04-10: 10 mg via INTRAVENOUS
  Filled 2014-04-09: qty 1

## 2014-04-09 MED ORDER — HYDROMORPHONE HCL 1 MG/ML IJ SOLN
INTRAMUSCULAR | Status: AC
  Administered 2014-04-09: 0.5 mg via INTRAVENOUS
  Filled 2014-04-09: qty 1

## 2014-04-09 MED ORDER — METHOCARBAMOL 1000 MG/10ML IJ SOLN
500.0000 mg | Freq: Four times a day (QID) | INTRAVENOUS | Status: DC | PRN
  Filled 2014-04-09: qty 5

## 2014-04-09 MED ORDER — OXYCODONE HCL 5 MG PO TABS
5.0000 mg | ORAL_TABLET | ORAL | Status: DC | PRN
  Administered 2014-04-09 – 2014-04-11 (×15): 10 mg via ORAL
  Filled 2014-04-09 (×15): qty 2

## 2014-04-09 MED ORDER — OXYCODONE HCL 5 MG/5ML PO SOLN: 5.0000 mg | Freq: Once | ORAL | Status: AC | PRN

## 2014-04-09 MED ORDER — LABETALOL HCL 5 MG/ML IV SOLN
INTRAVENOUS | Status: DC | PRN
  Administered 2014-04-09: 5 mg via INTRAVENOUS

## 2014-04-09 MED ORDER — PANTOPRAZOLE SODIUM 40 MG PO TBEC
40.0000 mg | DELAYED_RELEASE_TABLET | Freq: Every day | ORAL | Status: DC
  Administered 2014-04-10 – 2014-04-11 (×2): 40 mg via ORAL
  Filled 2014-04-09 (×2): qty 1

## 2014-04-09 MED ORDER — OXYCODONE-ACETAMINOPHEN 5-325 MG PO TABS: 2.0000 | ORAL_TABLET | ORAL | Status: DC | PRN

## 2014-04-09 MED ORDER — METOCLOPRAMIDE HCL 5 MG PO TABS
5.0000 mg | ORAL_TABLET | Freq: Three times a day (TID) | ORAL | Status: DC | PRN
  Filled 2014-04-09: qty 2

## 2014-04-09 MED ORDER — CEFAZOLIN SODIUM-DEXTROSE 2-3 GM-% IV SOLR
2.0000 g | Freq: Four times a day (QID) | INTRAVENOUS | Status: AC
  Administered 2014-04-09 (×2): 2 g via INTRAVENOUS
  Filled 2014-04-09 (×2): qty 50

## 2014-04-09 MED ORDER — CELECOXIB 200 MG PO CAPS: 200.0000 mg | ORAL_CAPSULE | Freq: Two times a day (BID) | ORAL | Status: DC

## 2014-04-09 MED ORDER — FENTANYL CITRATE 0.05 MG/ML IJ SOLN
INTRAMUSCULAR | Status: DC | PRN
  Administered 2014-04-09 (×2): 50 ug via INTRAVENOUS
  Administered 2014-04-09 (×3): 100 ug via INTRAVENOUS

## 2014-04-09 MED ORDER — HYDROMORPHONE HCL 1 MG/ML IJ SOLN
INTRAMUSCULAR | Status: AC
  Filled 2014-04-09: qty 1

## 2014-04-09 MED ORDER — DOCUSATE SODIUM 100 MG PO CAPS: 100.0000 mg | ORAL_CAPSULE | Freq: Two times a day (BID) | ORAL | Status: DC

## 2014-04-09 MED ORDER — METHOCARBAMOL 500 MG PO TABS: 500.0000 mg | ORAL_TABLET | Freq: Four times a day (QID) | ORAL | Status: DC | PRN

## 2014-04-09 MED ORDER — ROCURONIUM BROMIDE 50 MG/5ML IV SOLN
INTRAVENOUS | Status: AC
  Filled 2014-04-09: qty 1

## 2014-04-09 MED ORDER — ACETAMINOPHEN 325 MG PO TABS
650.0000 mg | ORAL_TABLET | Freq: Four times a day (QID) | ORAL | Status: DC | PRN
  Administered 2014-04-09 – 2014-04-10 (×2): 650 mg via ORAL
  Filled 2014-04-09 (×2): qty 2

## 2014-04-09 SURGICAL SUPPLY — 79 items
ADH SKN CLS APL DERMABOND .7 (GAUZE/BANDAGES/DRESSINGS)
BANDAGE ESMARK 6X9 LF (GAUZE/BANDAGES/DRESSINGS) ×1 IMPLANT
BLADE SAG 18X100X1.27 (BLADE) ×6 IMPLANT
BNDG CMPR 9X6 STRL LF SNTH (GAUZE/BANDAGES/DRESSINGS) ×1
BNDG CMPR MED 10X6 ELC LF (GAUZE/BANDAGES/DRESSINGS) ×1
BNDG COHESIVE 6X5 TAN STRL LF (GAUZE/BANDAGES/DRESSINGS) ×3 IMPLANT
BNDG ELASTIC 6X10 VLCR STRL LF (GAUZE/BANDAGES/DRESSINGS) ×2 IMPLANT
BNDG ESMARK 6X9 LF (GAUZE/BANDAGES/DRESSINGS) ×3
BOWL SMART MIX CTS (DISPOSABLE) ×3 IMPLANT
CAPT KNEE TOTAL 3 ×2 IMPLANT
CEMENT BONE SIMPLEX SPEEDSET (Cement) ×6 IMPLANT
CLOSURE STERI-STRIP 1/2X4 (GAUZE/BANDAGES/DRESSINGS) ×1
CLOSURE WOUND 1/2 X4 (GAUZE/BANDAGES/DRESSINGS)
CLSR STERI-STRIP ANTIMIC 1/2X4 (GAUZE/BANDAGES/DRESSINGS) ×1 IMPLANT
CONT SPEC 4OZ CLIKSEAL STRL BL (MISCELLANEOUS) ×2 IMPLANT
COVER SURGICAL LIGHT HANDLE (MISCELLANEOUS) ×3 IMPLANT
CUFF TOURNIQUET SINGLE 34IN LL (TOURNIQUET CUFF) ×3 IMPLANT
DERMABOND ADVANCED (GAUZE/BANDAGES/DRESSINGS)
DERMABOND ADVANCED .7 DNX12 (GAUZE/BANDAGES/DRESSINGS) ×1 IMPLANT
DRAPE EXTREMITY T 121X128X90 (DRAPE) ×3 IMPLANT
DRAPE IMP U-DRAPE 54X76 (DRAPES) ×3 IMPLANT
DRAPE PROXIMA HALF (DRAPES) ×3 IMPLANT
DRAPE U-SHAPE 47X51 STRL (DRAPES) ×3 IMPLANT
DRSG ADAPTIC 3X8 NADH LF (GAUZE/BANDAGES/DRESSINGS) ×1 IMPLANT
DRSG AQUACEL AG ADV 3.5X10 (GAUZE/BANDAGES/DRESSINGS) IMPLANT
DRSG PAD ABDOMINAL 8X10 ST (GAUZE/BANDAGES/DRESSINGS) ×3 IMPLANT
DURAPREP 26ML APPLICATOR (WOUND CARE) ×6 IMPLANT
ELECT CAUTERY BLADE 6.4 (BLADE) ×3 IMPLANT
ELECT REM PT RETURN 9FT ADLT (ELECTROSURGICAL) ×3
ELECTRODE REM PT RTRN 9FT ADLT (ELECTROSURGICAL) ×1 IMPLANT
FACESHIELD WRAPAROUND (MASK) ×9 IMPLANT
FACESHIELD WRAPAROUND OR TEAM (MASK) ×1 IMPLANT
GAUZE SPONGE 4X4 12PLY STRL (GAUZE/BANDAGES/DRESSINGS) ×1 IMPLANT
GLOVE BIO SURGEON STRL SZ 6.5 (GLOVE) ×1 IMPLANT
GLOVE BIO SURGEON STRL SZ7.5 (GLOVE) ×7 IMPLANT
GLOVE BIO SURGEON STRL SZ8 (GLOVE) ×2 IMPLANT
GLOVE BIO SURGEONS STRL SZ 6.5 (GLOVE) ×1
GLOVE BIOGEL PI IND STRL 6.5 (GLOVE) IMPLANT
GLOVE BIOGEL PI IND STRL 8 (GLOVE) ×1 IMPLANT
GLOVE BIOGEL PI INDICATOR 6.5 (GLOVE) ×4
GLOVE BIOGEL PI INDICATOR 8 (GLOVE) ×4
GLOVE SURG SS PI 6.5 STRL IVOR (GLOVE) ×2 IMPLANT
GOWN STRL REUS W/ TWL LRG LVL3 (GOWN DISPOSABLE) ×2 IMPLANT
GOWN STRL REUS W/ TWL XL LVL3 (GOWN DISPOSABLE) IMPLANT
GOWN STRL REUS W/TWL LRG LVL3 (GOWN DISPOSABLE) ×6
GOWN STRL REUS W/TWL XL LVL3 (GOWN DISPOSABLE) ×6
HANDPIECE INTERPULSE COAX TIP (DISPOSABLE)
IMMOBILIZER KNEE 22 UNIV (SOFTGOODS) ×3 IMPLANT
IMMOBILIZER KNEE 24 THIGH 36 (MISCELLANEOUS) IMPLANT
IMMOBILIZER KNEE 24 UNIV (MISCELLANEOUS)
INSERT TIBIAL SZ4 9MM (Orthopedic Implant) ×2 IMPLANT
KIT BASIN OR (CUSTOM PROCEDURE TRAY) ×3 IMPLANT
KIT ROOM TURNOVER OR (KITS) ×3 IMPLANT
MANIFOLD NEPTUNE II (INSTRUMENTS) ×3 IMPLANT
NDL 18GX1X1/2 (RX/OR ONLY) (NEEDLE) ×1 IMPLANT
NEEDLE 18GX1X1/2 (RX/OR ONLY) (NEEDLE) ×3 IMPLANT
NS IRRIG 1000ML POUR BTL (IV SOLUTION) ×3 IMPLANT
PACK TOTAL JOINT (CUSTOM PROCEDURE TRAY) ×3 IMPLANT
PACK UNIVERSAL I (CUSTOM PROCEDURE TRAY) ×3 IMPLANT
PAD ARMBOARD 7.5X6 YLW CONV (MISCELLANEOUS) ×6 IMPLANT
PAD CAST 4YDX4 CTTN HI CHSV (CAST SUPPLIES) IMPLANT
PADDING CAST COTTON 4X4 STRL (CAST SUPPLIES) ×3
PADDING CAST COTTON 6X4 STRL (CAST SUPPLIES) ×2 IMPLANT
SET HNDPC FAN SPRY TIP SCT (DISPOSABLE) IMPLANT
SPONGE GAUZE 4X4 12PLY STER LF (GAUZE/BANDAGES/DRESSINGS) ×2 IMPLANT
STAPLER VISISTAT 35W (STAPLE) IMPLANT
STRIP CLOSURE SKIN 1/2X4 (GAUZE/BANDAGES/DRESSINGS) IMPLANT
SUCTION FRAZIER TIP 10 FR DISP (SUCTIONS) ×3 IMPLANT
SUT MNCRL AB 4-0 PS2 18 (SUTURE) ×3 IMPLANT
SUT MON AB 2-0 CT1 27 (SUTURE) ×6 IMPLANT
SUT VIC AB 0 CT1 27 (SUTURE) ×3
SUT VIC AB 0 CT1 27XBRD ANBCTR (SUTURE) ×1 IMPLANT
SUT VIC AB 1 CTX 36 (SUTURE) ×6
SUT VIC AB 1 CTX36XBRD ANBCTR (SUTURE) ×2 IMPLANT
SYR 50ML LL SCALE MARK (SYRINGE) ×3 IMPLANT
TOWEL OR 17X24 6PK STRL BLUE (TOWEL DISPOSABLE) ×3 IMPLANT
TOWEL OR 17X26 10 PK STRL BLUE (TOWEL DISPOSABLE) ×3 IMPLANT
TRAY FOLEY BAG SILVER LF 14FR (CATHETERS) IMPLANT
WATER STERILE IRR 1000ML POUR (IV SOLUTION) ×2 IMPLANT

## 2014-04-09 NOTE — Transfer of Care (Signed)
Immediate Anesthesia Transfer of Care Note  Patient: Jessica Good  Procedure(s) Performed: Procedure(s): TOTAL KNEE ARTHROPLASTY (Right)  Patient Location: PACU  Anesthesia Type:GA combined with regional for post-op pain  Level of Consciousness: awake, alert  and oriented  Airway & Oxygen Therapy: Patient Spontanous Breathing and Patient connected to nasal cannula oxygen  Post-op Assessment: Report given to PACU RN, Post -op Vital signs reviewed and stable and Patient moving all extremities  Post vital signs: Reviewed and stable  Complications: No apparent anesthesia complications

## 2014-04-09 NOTE — Anesthesia Preprocedure Evaluation (Addendum)
Anesthesia Evaluation  Patient identified by MRN, date of birth, ID band Patient awake    Reviewed: Allergy & Precautions, H&P , NPO status , Patient's Chart, lab work & pertinent test results  Airway Mallampati: II  TM Distance: >3 FB Neck ROM: full    Dental  (+) Teeth Intact, Dental Advisory Given   Pulmonary neg pulmonary ROS,          Cardiovascular hypertension, Pt. on medications     Neuro/Psych Anxiety    GI/Hepatic GERD-  Medicated and Controlled,  Endo/Other  diabetes, Well Controlled, Type 2Morbid obesity  Renal/GU      Musculoskeletal  (+) Arthritis -,   Abdominal   Peds  Hematology   Anesthesia Other Findings   Reproductive/Obstetrics                            Anesthesia Physical Anesthesia Plan  ASA: II  Anesthesia Plan: General and Regional   Post-op Pain Management: MAC Combined w/ Regional for Post-op pain   Induction: Intravenous  Airway Management Planned: Oral ETT  Additional Equipment:   Intra-op Plan:   Post-operative Plan: Extubation in OR  Informed Consent: I have reviewed the patients History and Physical, chart, labs and discussed the procedure including the risks, benefits and alternatives for the proposed anesthesia with the patient or authorized representative who has indicated his/her understanding and acceptance.     Plan Discussed with: CRNA, Anesthesiologist and Surgeon  Anesthesia Plan Comments:         Anesthesia Quick Evaluation

## 2014-04-09 NOTE — Progress Notes (Signed)
Orthopedic Tech Progress Note Patient Details:  Jessica Good 05/27/1950 [id number] Off cpm at 8:06 pm Patient ID: Jessica Good, female   DOB: 1950-10-06, 63 y.o.   MRN: [id number]   [name] 04/09/2014, 8:06 PM

## 2014-04-09 NOTE — Evaluation (Signed)
Physical Therapy Evaluation Patient Details Name: Jessica Good MRN: [id number] DOB: 20-Apr-1951 Today's Date: 04/09/2014   History of Present Illness  Patient is a 63 y/o female admitted for right TKA.  Clinical Impression  Patient presents with decreased independence with mobility due to deficits listed in PT problem list.  She will benefit from skilled PT in the acute setting to allow return home with family assist and HHPT.    Follow Up Recommendations Home health PT;Supervision/Assistance - 24 hour    Equipment Recommendations  None recommended by PT    Recommendations for Other Services       Precautions / Restrictions Precautions Precautions: Fall;Knee Restrictions Weight Bearing Restrictions: Yes RLE Weight Bearing: Weight bearing as tolerated      Mobility  Bed Mobility Overal bed mobility: Needs Assistance Bed Mobility: Supine to Sit     Supine to sit: Min assist     General bed mobility comments: for right LE  Transfers Overall transfer level: Needs assistance Equipment used: Rolling walker (2 wheeled) Transfers: Sit to/from Stand Sit to Stand: Min assist         General transfer comment: cues for technique, assist for balance  Ambulation/Gait Ambulation/Gait assistance: Min assist Ambulation Distance (Feet): 3 Feet Assistive device: Rolling walker (2 wheeled) Gait Pattern/deviations: Step-to pattern;Antalgic     General Gait Details: pivotal steps to [facility], then chair  Stairs            Wheelchair Mobility    Modified Rankin (Stroke Patients Only)       Balance Overall balance assessment: Needs assistance         Standing balance support: Bilateral upper extremity supported Standing balance-Leahy Scale: Poor Standing balance comment: UE support for balance, one loss of balance reaching back to wipe after toileting                             Pertinent Vitals/Pain Pain Assessment: 0-10 Pain Score: 7  Pain  Location: right knee Pain Intervention(s): Monitored during session;RN gave pain meds during session;Repositioned;Ice applied    Home Living Family/patient expects to be discharged to:: Private residence Living Arrangements: Spouse/significant other Available Help at Discharge: Family;Available 24 hours/day Type of Home: House Home Access: Stairs to enter Entrance Stairs-Rails: Psychiatric nurse of Steps: 3 Home Layout: Two level;Able to live on main level with bedroom/bathroom Home Equipment: Gilford Rile - 2 wheels;Shower seat;Cane - single point;Grab bars - tub/shower      Prior Function Level of Independence: Independent with assistive device(s)         Comments: use of cane intermittent     Hand Dominance        Extremity/Trunk Assessment               Lower Extremity Assessment: RLE deficits/detail RLE Deficits / Details: ankle AROM WFL, knee AROM about -20 to 40 with ace wrap       Communication   Communication: No difficulties  Cognition Arousal/Alertness: Awake/alert Behavior During Therapy: WFL for tasks assessed/performed Overall Cognitive Status: Within Functional Limits for tasks assessed                      General Comments      Exercises Total Joint Exercises Ankle Circles/Pumps: AROM;10 reps;Both;Supine Quad Sets: AROM;Both;10 reps;Supine Heel Slides: AAROM;Right;10 reps      Assessment/Plan    PT Assessment Patient needs continued PT services  PT Diagnosis Acute pain;Difficulty  walking   PT Problem List Decreased strength;Decreased range of motion;Decreased activity tolerance;Decreased balance;Decreased mobility;Decreased knowledge of precautions;Decreased knowledge of use of DME;Pain  PT Treatment Interventions DME instruction;Balance training;Gait training;Stair training;Functional mobility training;Patient/family education;Therapeutic activities;Therapeutic exercise   PT Goals (Current goals can be found in the  Care Plan section) Acute Rehab PT Goals Patient Stated Goal: To go home PT Goal Formulation: With patient/family Time For Goal Achievement: 04/16/14 Potential to Achieve Goals: Good    Frequency Min 6X/week   Barriers to discharge        Co-evaluation               End of Session Equipment Utilized During Treatment: Gait belt;Right knee immobilizer Activity Tolerance: Patient tolerated treatment well Patient left: in chair;with call bell/phone within reach           Time: 9021-1155 PT Time Calculation (min) (ACUTE ONLY): 34 min   Charges:   PT Evaluation $Initial PT Evaluation Tier I: 1 Procedure PT Treatments $Therapeutic Exercise: 8-22 mins $Therapeutic Activity: 8-22 mins   PT G Codes:          WYNN,CYNDI 04-27-14, 5:07 PM Magda Kiel, [place] 2014/04/27

## 2014-04-09 NOTE — Op Note (Signed)
DATE OF SURGERY:  04/09/2014 TIME: 9:36 AM  PATIENT NAME:  Jessica Good   AGE: 63 y.o.    PRE-OPERATIVE DIAGNOSIS:  OA RIGHT KNEE  POST-OPERATIVE DIAGNOSIS:  Same  PROCEDURE:  Procedure(s): TOTAL KNEE ARTHROPLASTY   SURGEON:  Edmonia Lynch, D, MD   ASSISTANT:  Joya Gaskins OPA. He was necessary for an efficient case and appropriate visualization throughout the entire case.   OPERATIVE IMPLANTS: Stryker Triathlon Posterior Stabilized.  Femur size 4, Tibia size 4 universal base plate, Patella size 29 3-peg oval button, with a 9 mm polyethylene insert.   PREOPERATIVE INDICATIONS:  Jessica Good is a 63 y.o. year old female with end stage bone on bone degenerative arthritis of the knee who failed conservative treatment, including injections, antiinflammatories, activity modification, and assistive devices, and had significant impairment of their activities of daily living, and elected for Total Knee Arthroplasty.   The risks, benefits, and alternatives were discussed at length including but not limited to the risks of infection, bleeding, nerve injury, stiffness, blood clots, the need for revision surgery, cardiopulmonary complications, among others, and they were willing to proceed.   OPERATIVE DESCRIPTION:  The patient was brought to the operative room and placed in a supine position.  General anesthesia was administered.  IV antibiotics were given.  The lower extremity was prepped and draped in the usual sterile fashion.  Time out was performed.  The leg was elevated and exsanguinated and the tourniquet was inflated.  Anterior approach was performed.  The patella was everted and osteophytes were removed.  The anterior horn of the medial and lateral meniscus was removed.   The distal femur was opened with the drill and the intramedullary distal femoral cutting jig was utilized, set at 5 degrees resecting 10 mm off the distal femur due to her flexion contracture I increased  this from 8.  Care was taken to protect the collateral ligaments.  The distal femoral sizing jig was applied, taking care to avoid notching.  Then the 4-in-1 cutting jig was applied and the anterior and posterior femur was cut, along with the chamfer cuts.  All posterior osteophytes were removed.  The flexion gap was then measured and was symmetric with the extension gap.  Then the extramedullary tibial cutting jig was utilized making the appropriate cut using the anterior tibial crest as a reference building in appropriate posterior slope.  Care was taken during the cut to protect the medial and collateral ligaments.  The proximal tibia was removed along with the posterior horns of the menisci.  The PCL was sacrificed.    The extensor gap was measured and was approximately 105mm.    I completed the distal femoral preparation using the appropriate jig to prepare the box.  The patella was then measured, and cut with the saw.    The proximal tibia sized and prepared accordingly with the reamer and the punch, and then all components were trialed with the 35mm poly insert.  The knee was found to have excellent balance and full motion.    The above named components were then cemented into place and all excess cement was removed.  The real polyethylene implant was placed.  Exparel was injected in the soft tissue  The knee was easily taken through a range of motion and the patella tracked well and the knee irrigated copiously and the parapatellar and subcutaneous tissue closed with vicryl, and monocryl with steri strips for the skin.  The wounds were injected with exparell,  and dressed with sterile gauze and the tourniquet released and the patient was awakened and returned to the PACU in stable and satisfactory condition.  There were no complications.  Total tourniquet time was 80 minutes.   POSTOPERATIVE PLAN: post op Abx, DVT px: SCD's, TED's, Early ambulation and ASA 325  This note was generated  using a template and dragon dictation system. In light of that, I have reviewed the note and all aspects of it are applicable to this case. Any dictation errors are due to the computerized dictation system.

## 2014-04-09 NOTE — Progress Notes (Signed)
Called Dr.Hodierne for sign out  

## 2014-04-09 NOTE — Progress Notes (Signed)
Utilization review completed.  

## 2014-04-09 NOTE — Discharge Instructions (Signed)
Keep your dressing on and dry until follow up  You have been given a prescription anti-inflamatory, take it regularly and do not take any additional NSAIDS(advil/aleve) with it.   Bear weight as tolerated

## 2014-04-09 NOTE — Progress Notes (Signed)
Orthopedic Tech Progress Note Patient Details:  Jessica Good [birth year] [id number] CPM applied to RLE with appropriate settings. OHF applied to bed. Footsie roll provided. CPM Right Knee CPM Right Knee: On Right Knee Flexion (Degrees): 90 Right Knee Extension (Degrees): 0   Asia R Thompson 04/09/2014, 12:36 PM

## 2014-04-09 NOTE — Anesthesia Procedure Notes (Addendum)
Anesthesia Regional Block:  Femoral nerve block  Pre-Anesthetic Checklist: ,, timeout performed, Correct Patient, Correct Site, Correct Laterality, Correct Procedure,, site marked, risks and benefits discussed, Surgical consent,  Pre-op evaluation,  At surgeon's request and post-op pain management  Laterality: Right  Prep: chloraprep       Needles:  Injection technique: Single-shot  Needle Type: Echogenic Stimulator Needle     Needle Length: 9cm 9 cm Needle Gauge: 21 and 21 G    Additional Needles:  Procedures: nerve stimulator Femoral nerve block  Nerve Stimulator or Paresthesia:  Response: Quadriceps muscle contraction, 0.45 mA,   Additional Responses:   Narrative:  Start time: 04/09/2014 7:02 AM End time: 04/09/2014 7:15 AM Injection made incrementally with aspirations every 5 mL.  Performed by: Personally   Additional Notes: Functioning IV was confirmed and monitors were applied.  A 27mm 21ga Arrow echogenic stimulator needle was used. Sterile prep and drape,hand hygiene and sterile gloves were used.  Negative aspiration and negative test dose prior to incremental administration of local anesthetic. The patient tolerated the procedure well.     Procedure Name: Intubation Date/Time: 04/09/2014 8:11 AM Performed by: Melina Copa, Lowry Bala R Pre-anesthesia Checklist: Patient identified, Emergency Drugs available, Suction available, Patient being monitored and Timeout performed Patient Re-evaluated:Patient Re-evaluated prior to inductionOxygen Delivery Method: Circle system utilized Preoxygenation: Pre-oxygenation with 100% oxygen Intubation Type: IV induction Ventilation: Mask ventilation without difficulty Laryngoscope Size: Mac and 3 Grade View: Grade II Tube type: Oral Tube size: 7.5 mm Number of attempts: 1 Airway Equipment and Method: Stylet (Intubation by Chelsea Aus, EMT student) Placement Confirmation: ETT inserted through vocal cords under direct vision,  positive  ETCO2 and breath sounds checked- equal and bilateral Secured at: 22 cm Tube secured with: Tape Dental Injury: Teeth and Oropharynx as per pre-operative assessment

## 2014-04-09 NOTE — Interval H&P Note (Signed)
History and Physical Interval Note:  04/09/2014 7:53 AM  Jessica Good  has presented today for surgery, with the diagnosis of OA RIGHT KNEE  The various methods of treatment have been discussed with the patient and family. After consideration of risks, benefits and other options for treatment, the patient has consented to  Procedure(s): TOTAL KNEE ARTHROPLASTY (Right) as a surgical intervention .  The patient's history has been reviewed, patient examined, no change in status, stable for surgery.  I have reviewed the patient's chart and labs.  Questions were answered to the patient's satisfaction.     Kyann Heydt, D

## 2014-04-10 ENCOUNTER — Encounter (HOSPITAL_COMMUNITY): Payer: Self-pay | Admitting: General Practice

## 2014-04-10 LAB — GLUCOSE, CAPILLARY
GLUCOSE-CAPILLARY: 218 mg/dL — AB (ref 70–99)
Glucose-Capillary: 173 mg/dL — ABNORMAL HIGH (ref 70–99)
Glucose-Capillary: 177 mg/dL — ABNORMAL HIGH (ref 70–99)

## 2014-04-10 MED ORDER — INSULIN ASPART 100 UNIT/ML ~~LOC~~ SOLN
0.0000 [IU] | Freq: Every day | SUBCUTANEOUS | Status: DC
  Administered 2014-04-10: 2 [IU] via SUBCUTANEOUS

## 2014-04-10 MED ORDER — SODIUM CHLORIDE 0.9 % IV BOLUS (SEPSIS)
500.0000 mL | Freq: Once | INTRAVENOUS | Status: AC
  Administered 2014-04-10: 500 mL via INTRAVENOUS

## 2014-04-10 NOTE — Care Management Note (Signed)
CARE MANAGEMENT NOTE 04/10/2014  Patient:  Jessica Good, Jessica Good   Account Number:  [id number]  Date Initiated:  04/10/2014  Documentation initiated by:  Ricki Miller  Subjective/Objective Assessment:   63 yr old female admitted with osteoarthritis of the right knee. Patient had a right total knee arthroplasty.     Action/Plan:   Patient was preoperatively setup with [facility], no changes. patient has family support at discharge. Patient has rolling walker.   Anticipated DC Date:  04/10/2014   Anticipated DC Plan:  [place]  CM consult      PAC Choice  [place]   Choice offered to / List presented to:  C-1 Patient   DME arranged  CPM  3-N-1      DME agency  [employer]     [place] arranged  HH-2 PT      [place]   Status of service:  Completed, signed off Medicare Important Message given?   (If response is "NO", the following Medicare IM given date fields will be blank) Date Medicare IM given:   Medicare IM given by:   Date Additional Medicare IM given:   Additional Medicare IM given by:    Discharge Disposition:  [place]  Per UR Regulation:  Reviewed for med. necessity/level of care/duration of stay

## 2014-04-10 NOTE — Plan of Care (Signed)
Problem: Phase I Progression Outcomes Goal: CMS/Neurovascular status WDL Outcome: Completed/Met Date Met:  04/10/14 Goal: Dangle or out of bed evening of surgery Outcome: Completed/Met Date Met:  04/10/14 Goal: Hemodynamically stable Outcome: Completed/Met Date Met:  04/10/14  Problem: Phase II Progression Outcomes Goal: Tolerating diet Outcome: Completed/Met Date Met:  04/10/14

## 2014-04-10 NOTE — Progress Notes (Signed)
Physical Therapy Treatment Patient Details Name: Jessica Good MRN: [id number] DOB: January 11, 1951 Today's Date: 04/10/2014    History of Present Illness Patient is a 63 y/o female admitted for right TKA.    PT Comments    Session focused on therex (as we didn't get to perform this morning); noted improving R knee control against gravity, and pt is able to perform straight leg raise with minimal quad lag;   Anticipate good progress once Orthostatic BP and pain are more under control   Follow Up Recommendations  Home health PT;Supervision/Assistance - 24 hour     Equipment Recommendations  Other (comment) (consider youth RW if hers is standard height)    Recommendations for Other Services OT consult     Precautions / Restrictions Precautions Precautions: Fall;Knee Precaution Comments: Pt educated to not allow any pillow or bolster under knee for healing with optimal range of motion.  Required Braces or Orthoses: Knee Immobilizer - Right Knee Immobilizer - Right: On when out of bed or walking Restrictions Weight Bearing Restrictions: Yes RLE Weight Bearing: Weight bearing as tolerated    Mobility  Bed Mobility Overal bed mobility: Needs Assistance Bed Mobility: Rolling;Sit to Supine Rolling: Supervision   Supine to sit: Mod assist Sit to supine: Mod assist   General bed mobility comments: Good use of rail; showed husband optimal positioning in bed  Transfers Overall transfer level: Needs assistance Equipment used: Rolling walker (2 wheeled) Transfers: Sit to/from [facility] Sit to Stand: Min assist Stand pivot transfers: Min assist       General transfer comment: cues for technique, assist for balance, second person for safety  Ambulation/Gait Ambulation/Gait assistance: Min assist Ambulation Distance (Feet): 2 Feet (pivot steps bed to [facility] to recliner) Assistive device: Rolling walker (2 wheeled) Gait Pattern/deviations: Step-to  pattern;Decreased step length - left;Decreased stance time - right;Trunk flexed     General Gait Details: pivotal steps to [facility], then chair; significant;y decr tolerance of WB RLE   Stairs            Wheelchair Mobility    Modified Rankin (Stroke Patients Only)       Balance                                    Cognition Arousal/Alertness: Lethargic;Suspect due to medications Behavior During Therapy: [facility] for tasks assessed/performed Overall Cognitive Status: Within Functional Limits for tasks assessed                      Exercises Total Joint Exercises Ankle Circles/Pumps: AROM;10 reps;Both;Supine Quad Sets: AROM;Right;20 reps;Supine Short Arc Quad: AAROM;AROM;Right;10 reps Heel Slides: AAROM;Right;10 reps Hip ABduction/ADduction: AAROM;Right;10 reps Straight Leg Raises: AAROM;AROM;Right;10 reps Goniometric ROM: approx 0-75 deg    General Comments General comments (skin integrity, edema, etc.): Session limited by orthostaic hypotension      Pertinent Vitals/Pain Pain Assessment: Faces Pain Score: 9  Faces Pain Scale: Hurts even more Pain Location: R knee Pain Descriptors / Indicators: Grimacing Pain Intervention(s): Monitored during session    Home Living Family/patient expects to be discharged to:: Private residence Living Arrangements: Spouse/significant other Available Help at Discharge: Family;Available 24 hours/day Type of Home: House Home Access: Stairs to enter Entrance Stairs-Rails: Right;Left Home Layout: Two level;Able to live on main level with bedroom/bathroom Home Equipment: Gilford Rile - 2 wheels;Shower seat;Cane - single point;Grab bars - tub/shower      Prior Function  Level of Independence: Independent with assistive device(s)      Comments: use of cane intermittent   PT Goals (current goals can now be found in the care plan section) Acute Rehab PT Goals Patient Stated Goal: to go home safely PT Goal Formulation:  With patient/family Time For Goal Achievement: 04/16/14 Potential to Achieve Goals: Good Progress towards PT goals: Progressing toward goals    Frequency  7X/week    PT Plan Current plan remains appropriate    Co-evaluation             End of Session Equipment Utilized During Treatment: Gait belt;Right knee immobilizer Activity Tolerance: Patient tolerated treatment well Patient left: in bed;with call bell/phone within reach;Other (comment) (on bedpan to void)     Time: 7169-6789 PT Time Calculation (min) (ACUTE ONLY): 23 min  Charges:  $Gait Training: 8-22 mins $Therapeutic Exercise: 23-37 mins $Therapeutic Activity: 23-37 mins                    G Codes:      Quin Hoop 04/10/2014, 4:28 PM  Roney Marion, [place] Pager [phone] Office [phone]

## 2014-04-10 NOTE — Progress Notes (Addendum)
Physical Therapy Treatment Note  See for functional mobility training;  Orthostatic vitals as follows;     04/10/14 1209 04/10/14 1216 04/10/14 1224  Orthostatic Lying   BP- Lying 140/84 mmHg --  --   Pulse- Lying 91 --  --   Orthostatic Sitting  BP- Sitting --  126/54 mmHg --   Pulse- Sitting --  150 --   Orthostatic Standing at 0 minutes  BP- Standing at 0 minutes --  --  (!) 80/46 mmHg  Pulse- Standing at 0 minutes --  --  150     04/10/14 1229 04/10/14 1248  Orthostatic Lying   BP- Lying --  --   Pulse- Lying --  --   Orthostatic Sitting  BP- Sitting 114/89 mmHg 122/40 mmHg  Pulse- Sitting 97 (on commode feet propped) 89 (Reclined post transfer from [facility])  Orthostatic Standing at 0 minutes  BP- Standing at 0 minutes --  --   Pulse- Standing at 0 minutes --  --     Notified RN,  and she re-started fluids;   Pt is unable to safely dc home today; Full note to follow;   Roney Marion, [place] Pager [phone] Office [phone]

## 2014-04-10 NOTE — Plan of Care (Signed)
Problem: Phase I Progression Outcomes Goal: Initial discharge plan identified Outcome: Completed/Met Date Met:  04/10/14 Goal: Other Phase I Outcomes/Goals Outcome: Not Applicable Date Met:  14/48/18  Problem: Phase II Progression Outcomes Goal: Discharge plan established Outcome: Completed/Met Date Met:  04/10/14 Goal: Other Phase II Outcomes/Goals Outcome: Not Applicable Date Met:  56/31/49

## 2014-04-10 NOTE — Evaluation (Signed)
Occupational Therapy Evaluation Patient Details Name: Jessica Good MRN: [id number] DOB: 1951/04/09 Today's Date: 04/10/2014    History of Present Illness Patient is a 63 y/o female admitted for right TKA.   Clinical Impression   Pt was performing ADL and ADL transfers at a modified independent level prior to admission.  Evaluation limited to stand-pivot transfer and EOB due to orthostatic hypotension, but pt only physically requiring min assist for OOB mobility.  Pt also sleepy, likely secondary to pain meds. Husband is very attentive and will be available to assist upon d/c. Will follow.    Follow Up Recommendations  No OT follow up;Supervision/Assistance - 24 hour    Equipment Recommendations   (TBD)    Recommendations for Other Services       Precautions / Restrictions Precautions Precautions: Fall;Knee Precaution Comments:  Required Braces or Orthoses: Knee Immobilizer - Right Knee Immobilizer - Right: On when out of bed or walking Restrictions Weight Bearing Restrictions: Yes RLE Weight Bearing: Weight bearing as tolerated      Mobility Bed Mobility Overal bed mobility: Needs Assistance Bed Mobility: Rolling;Sit to Supine Rolling: Supervision   Supine to sit: Mod assist Sit to supine: Mod assist   General bed mobility comments: Good use of rail; assist for R LE  Transfers Overall transfer level: Needs assistance Equipment used: Rolling walker (2 wheeled) Transfers: Sit to/from [facility] Sit to Stand: Min assist Stand pivot transfers: Min assist       General transfer comment: cues for technique, assist for balance, second person for safety    Balance                                            ADL Overall ADL's : Needs assistance/impaired Eating/Feeding: Independent;Sitting   Grooming: Wash/dry hands;Wash/dry face;Sitting;Set up   Upper Body Bathing: Minimal assitance;Sitting   Lower Body Bathing: Maximal  assistance;Sit to/from stand   Upper Body Dressing : Set up;Sitting   Lower Body Dressing: Maximal assistance   Toilet Transfer: Minimal assistance;Stand-pivot;BSC   Toileting- Clothing Manipulation and Hygiene: Moderate assistance;Sit to/from stand         General ADL Comments: Pt with hypotension today, limited mobility to stand-pivot to 3 in1 and bed.     Vision                     Perception     Praxis      Pertinent Vitals/Pain Pain Assessment: Faces Pain Score: 9  Faces Pain Scale: Hurts even more Pain Location: R knee Pain Descriptors / Indicators: Grimacing Pain Intervention(s): Monitored during session     Hand Dominance Right   Extremity/Trunk Assessment Upper Extremity Assessment Upper Extremity Assessment: Overall WFL for tasks assessed   Lower Extremity Assessment Lower Extremity Assessment: Defer to PT evaluation       Communication Communication Communication: No difficulties   Cognition Arousal/Alertness: Lethargic;Suspect due to medications Behavior During Therapy: [employer] for tasks assessed/performed Overall Cognitive Status: Within Functional Limits for tasks assessed                     General Comments       Exercises      Shoulder Instructions      Home Living Family/patient expects to be discharged to:: Private residence Living Arrangements: Spouse/significant other Available Help at Discharge: Family;Available 24 hours/day Type  of Home: House Home Access: Stairs to enter [employer] of Steps: 3 Entrance Stairs-Rails: Right;Left Home Layout: Two level;Able to live on main level with bedroom/bathroom Alternate Level Stairs-Number of Steps: stays on main   Bathroom Shower/Tub: Tub/shower unit   Bathroom Toilet: Handicapped height     Home Equipment: Environmental consultant - 2 wheels;Shower seat;Cane - single point;Grab bars - tub/shower          Prior Functioning/Environment Level of Independence: Independent  with assistive device(s)        Comments: use of cane intermittent    OT Diagnosis: Generalized weakness;Acute pain   OT Problem List: Decreased strength;Decreased activity tolerance;Impaired balance (sitting and/or standing);Decreased knowledge of use of DME or AE;Obesity;Pain   OT Treatment/Interventions: Self-care/ADL training;DME and/or AE instruction;Therapeutic activities;Patient/family education;Balance training    OT Goals(Current goals can be found in the care plan section) Acute Rehab OT Goals Patient Stated Goal: to go home safely OT Goal Formulation: With patient Time For Goal Achievement: 04/17/14 Potential to Achieve Goals: Good ADL Goals Pt Will Perform Grooming: with supervision;standing Pt Will Transfer to Toilet: with supervision;ambulating (comfort height vs BSC over toilet) Pt Will Perform Toileting - Clothing Manipulation and hygiene: with supervision;sit to/from stand Additional ADL Goal #1: Pt and husband will be knowledgeable in use of AE for LB ADL.  OT Frequency: Min 2X/week   Barriers to D/C:            Co-evaluation              End of Session CPM Right Knee CPM Right Knee: Off Nurse Communication: Patient requests pain meds  Activity Tolerance: Patient limited by fatigue;Patient limited by pain;Treatment limited secondary to medical complications (Comment) (orthostatic) Patient left: in bed;with call bell/phone within reach;with nursing/sitter in room;with family/visitor present   Time: 9798-9211 OT Time Calculation (min): 28 min Charges:  OT General Charges $OT Visit: 1 Procedure OT Evaluation $Initial OT Evaluation Tier I: 1 Procedure OT Treatments $Self Care/Home Management : 8-22 mins G-Codes:    Malka So 04/10/2014, 4:35 PM  [phone]

## 2014-04-10 NOTE — Discharge Summary (Signed)
Physician Discharge Summary  Patient ID: Jessica Good MRN: [id number] DOB/AGE: [birth year] 63 y.o.  Admit date: 04/09/2014 Discharge date: 04/10/2014  Admission Diagnoses:  <principal problem not specified>  Discharge Diagnoses:  Active Problems:   Arthritis of knee   Past Medical History  Diagnosis Date  . Hypertension   . Anxiety   . Osteoarthritis   . GERD (gastroesophageal reflux disease)   . Diabetes mellitus without complication     [place]line  . Constipation due to pain medication   . Urgency of urination   . Edema of lower extremity     Takes lasix PRN    Surgeries: Procedure(s): TOTAL KNEE ARTHROPLASTY on 04/09/2014   Consultants (if any):    Discharged Condition: Improved  Hospital Course: Jessica Good is an 63 y.o. female who was admitted 04/09/2014 with a diagnosis of <principal problem not specified> and went to the operating room on 04/09/2014 and underwent the above named procedures.    She was given perioperative antibiotics:  Anti-infectives    Start     Dose/Rate Route Frequency Ordered Stop   04/09/14 1200  ceFAZolin (ANCEF) IVPB 2 g/50 mL premix     2 g100 mL/hr over 30 Minutes Intravenous Every 6 hours 04/09/14 1145 04/09/14 1908   04/09/14 0600  ceFAZolin (ANCEF) IVPB 2 g/50 mL premix     2 g100 mL/hr over 30 Minutes Intravenous On call to O.R. 04/08/14 1420 04/09/14 0756    .  She was given sequential compression devices, early ambulation, and ASA  for DVT prophylaxis.  She benefited maximally from the hospital stay and there were no complications.    Recent vital signs:  Filed Vitals:   04/10/14 0549  BP: 118/48  Pulse:   Temp:   Resp:     Recent laboratory studies:  Lab Results  Component Value Date   HGB 13.3 04/03/2014   Lab Results  Component Value Date   WBC 5.0 04/03/2014   PLT 222 04/03/2014   Lab Results  Component Value Date   INR 1.05 04/03/2014   Lab Results  Component Value Date   NA 140 04/03/2014   K 4.0 04/03/2014   CL 101 04/03/2014   CO2 28 04/03/2014   BUN 10 04/03/2014   CREATININE 0.65 04/03/2014   GLUCOSE 122* 04/03/2014    Discharge Medications:     Medication List    TAKE these medications        aspirin EC 325 MG tablet  Take 1 tablet (325 mg total) by mouth daily.     celecoxib 200 MG capsule  Commonly known as:  CELEBREX  Take 1 capsule (200 mg total) by mouth 2 (two) times daily.     cholecalciferol 1000 UNITS tablet  Commonly known as:  VITAMIN D  Take 1,000 Units by mouth daily.     docusate sodium 100 MG capsule  Commonly known as:  COLACE  Take 1 capsule (100 mg total) by mouth 2 (two) times daily. Continue this while taking narcotics to help with bowel movements     furosemide 40 MG tablet  Commonly known as:  LASIX  Take 40 mg by mouth daily as needed for fluid.     lisinopril 20 MG tablet  Commonly known as:  PRINIVIL,ZESTRIL  Take 20 mg by mouth daily.     lubiprostone 24 MCG capsule  Commonly known as:  AMITIZA  Take 1 capsule (24 mcg total) by mouth 2 (two) times daily with a meal.  methocarbamol 500 MG tablet  Commonly known as:  ROBAXIN  Take 1 tablet (500 mg total) by mouth every 6 (six) hours as needed.     multivitamin with minerals Tabs tablet  Take 1 tablet by mouth daily.     omeprazole 10 MG capsule  Commonly known as:  PRILOSEC  Take 10 mg by mouth daily.     ondansetron 4 MG tablet  Commonly known as:  ZOFRAN  Take 1 tablet (4 mg total) by mouth every 8 (eight) hours as needed for nausea.     oxyCODONE-acetaminophen 5-325 MG per tablet  Commonly known as:  ROXICET  Take 2 tablets by mouth every 4 (four) hours as needed.        Diagnostic Studies: Dg Knee 1-2 Views Right  04/09/2014   CLINICAL DATA:  63 year old status post right knee replacement  EXAM: RIGHT KNEE - 1-2 VIEW  COMPARISON:  None.  FINDINGS: The patient is status post right knee replacement. The femoral and tibial components are well seated. There  is no dislocation. Soft tissue swelling is present about the right knee. Scattered areas of subcutaneous emphysema are compatible with recent surgery.  IMPRESSION: Radiographically intact right knee replacement.   Electronically Signed   By: Rosemarie Ax   On: 04/09/2014 11:18    Disposition: Final discharge disposition not confirmed      Discharge Instructions    Weight bearing as tolerated    Complete by:  As directed            Follow-up Information    Follow up with Renette Butters, MD.   Specialty:  Orthopedic Surgery   Contact information:   [address] [place] [address] [phone]        Signed: Edmonia Lynch, D 04/10/2014, 10:09 AM

## 2014-04-10 NOTE — Progress Notes (Signed)
Patient ID: Jessica Good, female   DOB: 25-Jul-1950, 63 y.o.   MRN: [id number]     Subjective:  Patient reports pain as mild to moderate.  Patient denies any CP or SOB  Objective:   VITALS:   Filed Vitals:   04/10/14 0106 04/10/14 0356 04/10/14 0543 04/10/14 0549  BP: 140/44  99/50 118/48  Pulse: 82  85   Temp: 98.2 F (36.8 C)  98.2 F (36.8 C)   TempSrc: Oral     Resp: 18 18 16    Height:      Weight:      SpO2: 99% 99% 100%     ABD soft Sensation intact distally Dorsiflexion/Plantar flexion intact Incision: dressing C/D/I and no drainage   Lab Results  Component Value Date   WBC 5.0 04/03/2014   HGB 13.3 04/03/2014   HCT 40.4 04/03/2014   MCV 91.4 04/03/2014   PLT 222 04/03/2014     Assessment/Plan: 1 Day Post-Op   Active Problems:   Arthritis of knee   Advance diet Up with therapy  Continue WBAT Dry dressing PRN    Remonia Richter 04/10/2014, 8:48 AM   Edmonia Lynch MD [phone]

## 2014-04-10 NOTE — Progress Notes (Signed)
Physical Therapy Treatment Patient Details Name: Jessica Good MRN: [id number] DOB: 1950/12/31 Today's Date: 04/10/2014    History of Present Illness Patient is a 63 y/o female admitted for right TKA.    PT Comments    Functional mobility and activity tolerance limited by orthostatic hypotension, with standing BP 80/46, HR 150 ( RN notified, and restarted fluids); Slow progress, but anticipate good improvements once BP with activity stabilizes, and pain is better under control; See other note of this date for detailed Orthostatic vitals  Pt is not safe to dc home today   Follow Up Recommendations  Home health PT;Supervision/Assistance - 24 hour     Equipment Recommendations  Other (comment) (consider youth RW if hers is standard height)    Recommendations for Other Services OT consult     Precautions / Restrictions Precautions Precautions: Fall;Knee Precaution Comments: Pt educated to not allow any pillow or bolster under knee for healing with optimal range of motion.  Required Braces or Orthoses: Knee Immobilizer - Right Knee Immobilizer - Right:  (No order on chart, but it's helpful for stance stability) Restrictions RLE Weight Bearing: Weight bearing as tolerated    Mobility  Bed Mobility Overal bed mobility: Needs Assistance Bed Mobility: Supine to Sit     Supine to sit: Mod assist     General bed mobility comments: Mod handheld assist to pull to sit; Cues for technique  Transfers Overall transfer level: Needs assistance Equipment used: Rolling walker (2 wheeled) Transfers: Sit to/from Stand Sit to Stand: Min assist         General transfer comment: cues for technique, assist for balance  Ambulation/Gait Ambulation/Gait assistance: Min assist Ambulation Distance (Feet): 2 Feet (pivot steps bed to [facility] to recliner) Assistive device: Rolling walker (2 wheeled) Gait Pattern/deviations: Step-to pattern;Decreased step length - left;Decreased stance time -  right;Trunk flexed     General Gait Details: pivotal steps to [facility], then chair; significant;y decr tolerance of WB RLE   Stairs            Wheelchair Mobility    Modified Rankin (Stroke Patients Only)       Balance                                    Cognition Arousal/Alertness: Awake/alert Behavior During Therapy: WFL for tasks assessed/performed Overall Cognitive Status: Within Functional Limits for tasks assessed                      Exercises      General Comments General comments (skin integrity, edema, etc.): Session limited by orthostaic hypotension      Pertinent Vitals/Pain Pain Assessment: 0-10 Pain Score:  ("15") Pain Location: R knee, especially with WBing Pain Descriptors / Indicators: Aching;Grimacing Pain Intervention(s): Limited activity within patient's tolerance;Monitored during session;RN gave pain meds during session    Home Living Family/patient expects to be discharged to:: Private residence Living Arrangements: Spouse/significant other                  Prior Function            PT Goals (current goals can now be found in the care plan section) Acute Rehab PT Goals Patient Stated Goal: to go home safely PT Goal Formulation: With patient/family Time For Goal Achievement: 04/16/14 Potential to Achieve Goals: Good Progress towards PT goals: Progressing toward goals    Frequency  7X/week    PT Plan Current plan remains appropriate    Co-evaluation             End of Session Equipment Utilized During Treatment: Gait belt;Right knee immobilizer Activity Tolerance: Patient limited by pain Patient left: in chair;with call bell/phone within reach     Time: 1206-1300 PT Time Calculation (min) (ACUTE ONLY): 54 min  Charges:  $Gait Training: 8-22 mins $Therapeutic Activity: 23-37 mins                    G Codes:      Jessica Good 04/10/2014, 2:46 PM  Jessica Good, [place] Pager [phone] Office [phone]

## 2014-04-11 LAB — GLUCOSE, CAPILLARY
Glucose-Capillary: 149 mg/dL — ABNORMAL HIGH (ref 70–99)
Glucose-Capillary: 111 mg/dL — ABNORMAL HIGH (ref 70–99)

## 2014-04-11 NOTE — Progress Notes (Signed)
Physical Therapy Treatment Patient Details Name: Jessica Good MRN: [id number] DOB: [birth year] Today's Date: 04/11/2014    History of Present Illness Patient is a 63 y/o female admitted for right TKA.    PT Comments    Progressing with hallway ambulation and stair negotiation.  Spouse able to assist and reviewed car transfer into truck as well.  Will be safe for d/c home with HHPT and spouse assist.  Follow Up Recommendations  Home health PT;Supervision/Assistance - 24 hour     Equipment Recommendations  Other (comment) (youth RW)    Recommendations for Other Services       Precautions / Restrictions Precautions Precautions: Fall;Knee Required Braces or Orthoses: Knee Immobilizer - Right Restrictions RLE Weight Bearing: Weight bearing as tolerated    Mobility  Bed Mobility Overal bed mobility: Needs Assistance       Supine to sit: Min guard     General bed mobility comments: for right LE  Transfers Overall transfer level: Needs assistance Equipment used: Rolling walker (2 wheeled) Transfers: Sit to/from Stand Sit to Stand: Min guard         General transfer comment: for safety, but up from toilet independent  Ambulation/Gait Ambulation/Gait assistance: Min guard;Supervision Ambulation Distance (Feet): 60 Feet (x2) Assistive device: Rolling walker (2 wheeled) Gait Pattern/deviations: Step-through pattern;Trunk flexed;Antalgic     General Gait Details: cues for posture   Stairs Stairs: Yes Stairs assistance: Min assist Stair Management: One rail Right;Sideways Number of Stairs: 2 General stair comments: educated patient in technique with demo and spouse assist with stabilizing; also did reverse technique for practicing transfer into truck  Wheelchair Mobility    Modified Rankin (Stroke Patients Only)       Balance Overall balance assessment: Needs assistance           Standing balance-Leahy Scale: Fair Standing balance comment: UE  support for balance, but could wash hands without UE support                    Cognition Arousal/Alertness: Awake/alert Behavior During Therapy: WFL for tasks assessed/performed Overall Cognitive Status: Within Functional Limits for tasks assessed                      Exercises Total Joint Exercises Ankle Circles/Pumps: AROM;Right;10 reps;Seated Quad Sets: AROM;Right;10 reps;Seated Short Arc Quad: AROM;Right;10 reps;Seated Heel Slides: AROM;Right;10 reps;Seated;AAROM    General Comments        Pertinent Vitals/Pain Pain Score: 5  Pain Location: right knee Pain Intervention(s): Monitored during session;Repositioned    Home Living                      Prior Function            PT Goals (current goals can now be found in the care plan section) Progress towards PT goals: Progressing toward goals    Frequency  Min 6X/week    PT Plan Current plan remains appropriate    Co-evaluation             End of Session Equipment Utilized During Treatment: Gait belt;Right knee immobilizer Activity Tolerance: Patient tolerated treatment well Patient left: in chair;with call bell/phone within reach;with family/visitor present     Time: 1000-1050 PT Time Calculation (min) (ACUTE ONLY): 50 min  Charges:  $Gait Training: 23-37 mins $Therapeutic Exercise: 8-22 mins                    G  Codes:      Lashunda Greis,CYNDI 04/11/2014, 4:14 PM Magda Kiel, [place] 04/11/2014

## 2014-04-11 NOTE — Plan of Care (Signed)
Problem: Phase I Progression Outcomes Goal: Pain controlled with appropriate interventions Outcome: Completed/Met Date Met:  04/11/14  Problem: Phase II Progression Outcomes Goal: Ambulates Outcome: Completed/Met Date Met:  04/11/14  Problem: Phase III Progression Outcomes Goal: Ambulates Outcome: Progressing Goal: Incision clean - minimal/no drainage Outcome: Completed/Met Date Met:  04/11/14 Goal: Discharge plan remains appropriate-arrangements made Outcome: Completed/Met Date Met:  04/11/14

## 2014-04-11 NOTE — Progress Notes (Signed)
Physical Therapy Treatment Patient Details Name: Jessica Good MRN: [id number] DOB: [birth year] Today's Date: 04/11/2014    History of Present Illness Patient is a 63 y/o female admitted for right TKA.    PT Comments    Pt is progressing well with her mobility.  She needed one more session prior to d/c per MD recommendation.  We reinforced stair training, exercises, and gait training.  Pt is generally at supervision level for gait with RW and min guard assist for stairs.  Husband present throughout session for education.  Pt to d/c home with HHPT.   Follow Up Recommendations  Home health PT;Supervision/Assistance - 24 hour     Equipment Recommendations  Rolling walker with 5" wheels;Other (comment) (youth RW)    Recommendations for Other Services   NA     Precautions / Restrictions Precautions Precautions: Knee Required Braces or Orthoses: Knee Immobilizer - Right Knee Immobilizer - Right: On when out of bed or walking Restrictions RLE Weight Bearing: Weight bearing as tolerated    Mobility  Bed Mobility Overal bed mobility: Needs Assistance       Supine to sit: Min guard     General bed mobility comments: for right LE  Transfers Overall transfer level: Needs assistance Equipment used: Rolling walker (2 wheeled) Transfers: Sit to/from Stand Sit to Stand: Supervision         General transfer comment: Supervision for safety during transitions.  Pt using safe hand placement.   Ambulation/Gait Ambulation/Gait assistance: Supervision Ambulation Distance (Feet): 150 Feet Assistive device: Rolling walker (2 wheeled) Gait Pattern/deviations: Step-through pattern;Antalgic;Trunk flexed Gait velocity: decreased Gait velocity interpretation: Below normal speed for age/gender General Gait Details: Cues to not push the RW too far ahead of her as it makes her posture flexed.    Stairs Stairs: Yes Stairs assistance: Min guard Stair Management: One rail  Right;Sideways Number of Stairs: 3 General stair comments: Reviewed stair education with pt and her husband as well as safest guarding technique for him to use when they go home.  Pt able to verbalize and demonstrate correct LE sequencing.          Balance Overall balance assessment: Needs assistance Sitting-balance support: Feet supported;No upper extremity supported Sitting balance-Leahy Scale: Good     Standing balance support: No upper extremity supported;Bilateral upper extremity supported;Single extremity supported Standing balance-Leahy Scale: Fair Standing balance comment: static standing without upper extremity support, but anything dynamic needs UE support.                     Cognition Arousal/Alertness: Awake/alert Behavior During Therapy: WFL for tasks assessed/performed Overall Cognitive Status: Within Functional Limits for tasks assessed                      Exercises Total Joint Exercises Ankle Circles/Pumps: AROM;Right;10 reps;Seated Quad Sets: AROM;Right;10 reps;Seated Towel Squeeze: AROM;Both;10 reps;Supine Short Arc Quad: AROM;Right;10 reps;Seated Heel Slides: Right;10 reps;Seated;AAROM Hip ABduction/ADduction: AROM;10 reps;Both;Supine Straight Leg Raises: AAROM;Right;10 reps;Supine Long Arc Quad: AROM;Right;10 reps;Seated        Pertinent Vitals/Pain Pain Assessment: 0-10 Pain Score: 3  Pain Location: right knee Pain Descriptors / Indicators: Aching;Burning Pain Intervention(s): Limited activity within patient's tolerance;Monitored during session;Repositioned;Ice applied           PT Goals (current goals can now be found in the care plan section) Acute Rehab PT Goals Patient Stated Goal: to go home safely Progress towards PT goals: Progressing toward goals    Frequency  7X/week    PT Plan Current plan remains appropriate       End of Session Equipment Utilized During Treatment: Gait belt;Right knee immobilizer Activity  Tolerance: Patient tolerated treatment well Patient left: in chair;with call bell/phone within reach;with family/visitor present;with nursing/sitter in room     Time: 0164-2903 PT Time Calculation (min) (ACUTE ONLY): 29 min  Charges:  $Gait Training: 8-22 mins $Therapeutic Exercise: 8-22 mins                      Trica Usery B. [place], [place], DPT [phone]   04/11/2014, 5:49 PM

## 2014-04-11 NOTE — Discharge Summary (Signed)
Physician Discharge Summary  Patient ID: Jessica Good MRN: [id number] DOB/AGE: [id number] y.o.  Admit date: 04/09/2014 Discharge date: 04/11/2014  Admission Diagnoses:  <principal problem not specified>  Discharge Diagnoses:  Active Problems:   Arthritis of knee   Past Medical History  Diagnosis Date  . Hypertension   . Anxiety   . Osteoarthritis   . GERD (gastroesophageal reflux disease)   . Diabetes mellitus without complication     boarderline  . Constipation due to pain medication   . Urgency of urination   . Edema of lower extremity     Takes lasix PRN    Surgeries: Procedure(s): TOTAL KNEE ARTHROPLASTY on 04/09/2014   Consultants (if any):    Discharged Condition: Improved  Hospital Course: Jessica Good is an 63 y.o. female who was admitted 04/09/2014 with a diagnosis of <principal problem not specified> and went to the operating room on 04/09/2014 and underwent the above named procedures.    She was given perioperative antibiotics:  Anti-infectives    Start     Dose/Rate Route Frequency Ordered Stop   04/09/14 1200  ceFAZolin (ANCEF) IVPB 2 g/50 mL premix     2 g100 mL/hr over 30 Minutes Intravenous Every 6 hours 04/09/14 1145 04/09/14 1908   04/09/14 0600  ceFAZolin (ANCEF) IVPB 2 g/50 mL premix     2 g100 mL/hr over 30 Minutes Intravenous On call to O.R. 04/08/14 1420 04/09/14 0756    .  She was given sequential compression devices, early ambulation, and ASA 325 for DVT prophylaxis.  She benefited maximally from the hospital stay and there were no complications.    Recent vital signs:  Filed Vitals:   04/11/14 0954  BP: 135/61  Pulse:   Temp:   Resp:     Recent laboratory studies:  Lab Results  Component Value Date   HGB 13.3 04/03/2014   Lab Results  Component Value Date   WBC 5.0 04/03/2014   PLT 222 04/03/2014   Lab Results  Component Value Date   INR 1.05 04/03/2014   Lab Results  Component Value Date   NA 140 04/03/2014    K 4.0 04/03/2014   CL 101 04/03/2014   CO2 28 04/03/2014   BUN 10 04/03/2014   CREATININE 0.65 04/03/2014   GLUCOSE 122* 04/03/2014    Discharge Medications:     Medication List    TAKE these medications        aspirin EC 325 MG tablet  Take 1 tablet (325 mg total) by mouth daily.     celecoxib 200 MG capsule  Commonly known as:  CELEBREX  Take 1 capsule (200 mg total) by mouth 2 (two) times daily.     cholecalciferol 1000 UNITS tablet  Commonly known as:  VITAMIN D  Take 1,000 Units by mouth daily.     docusate sodium 100 MG capsule  Commonly known as:  COLACE  Take 1 capsule (100 mg total) by mouth 2 (two) times daily. Continue this while taking narcotics to help with bowel movements     furosemide 40 MG tablet  Commonly known as:  LASIX  Take 40 mg by mouth daily as needed for fluid.     lisinopril 20 MG tablet  Commonly known as:  PRINIVIL,ZESTRIL  Take 20 mg by mouth daily.     lubiprostone 24 MCG capsule  Commonly known as:  AMITIZA  Take 1 capsule (24 mcg total) by mouth 2 (two) times daily with a meal.  methocarbamol 500 MG tablet  Commonly known as:  ROBAXIN  Take 1 tablet (500 mg total) by mouth every 6 (six) hours as needed.     multivitamin with minerals Tabs tablet  Take 1 tablet by mouth daily.     omeprazole 10 MG capsule  Commonly known as:  PRILOSEC  Take 10 mg by mouth daily.     ondansetron 4 MG tablet  Commonly known as:  ZOFRAN  Take 1 tablet (4 mg total) by mouth every 8 (eight) hours as needed for nausea.     oxyCODONE-acetaminophen 5-325 MG per tablet  Commonly known as:  ROXICET  Take 2 tablets by mouth every 4 (four) hours as needed.        Diagnostic Studies: Dg Knee 1-2 Views Right  04/09/2014   CLINICAL DATA:  63 year old status post right knee replacement  EXAM: RIGHT KNEE - 1-2 VIEW  COMPARISON:  None.  FINDINGS: The patient is status post right knee replacement. The femoral and tibial components are well seated.  There is no dislocation. Soft tissue swelling is present about the right knee. Scattered areas of subcutaneous emphysema are compatible with recent surgery.  IMPRESSION: Radiographically intact right knee replacement.   Electronically Signed   By: Rosemarie Ax   On: 04/09/2014 11:18    Disposition: Final discharge disposition not confirmed      Discharge Instructions    Weight bearing as tolerated    Complete by:  As directed            Follow-up Information    Follow up with Renette Butters, MD.   Specialty:  Orthopedic Surgery   Contact information:   [address] [place] [address] [phone]       Follow up with [facility]   Why:  Someone from [facility] will contact you concerning start date and time fro physical therapy.   Contact information:   [address] [place] [address] [phone]        Signed: Edmonia Lynch, D 04/11/2014, 12:01 PM

## 2014-04-11 NOTE — Plan of Care (Signed)
Problem: Acute Rehab OT Goals (only OT should resolve) Goal: Pt. Will Perform Grooming Outcome: Completed/Met Date Met:  04/11/14 Goal: Pt. Will Transfer To Toilet Outcome: Completed/Met Date Met:  04/11/14 Goal: Pt. Will Perform Toileting-Clothing Manipulation Outcome: Completed/Met Date Met:  04/11/14 Goal: OT Additional ADL Goal #1 Outcome: Completed/Met Date Met:  04/11/14

## 2014-04-11 NOTE — Progress Notes (Signed)
Occupational Therapy Treatment and Discharge Patient Details Name: Jessica Good MRN: [id number] DOB: 11-21-1950 Today's Date: 04/11/2014    History of present illness Patient is a 63 y/o female admitted for right TKA.   OT comments  Pt feeling much better today. Requiring supervision for mobility with RW and able to tolerate standing at sink for grooming.  Educated pt and husband in use of AE for LB ADL.  Husband to purchase reacher and sock aide in gift shop. Pt is eager to go home.  Follow Up Recommendations  No OT follow up;Supervision/Assistance - 24 hour    Equipment Recommendations  None recommended by OT    Recommendations for Other Services      Precautions / Restrictions Precautions Precautions: Fall;Knee Required Braces or Orthoses: Knee Immobilizer - Right Knee Immobilizer - Right: On when out of bed or walking Restrictions RLE Weight Bearing: Weight bearing as tolerated       Mobility Bed Mobility               General bed mobility comments: pt up in chair  Transfers Overall transfer level: Needs assistance Equipment used: Rolling walker (2 wheeled) Transfers: Sit to/from Stand Sit to Stand: Supervision         General transfer comment: verbal reminders for technique, no physical assist from toilet or recliner    Balance                                   ADL Overall ADL's : Needs assistance/impaired     Grooming: Wash/dry hands;Supervision/safety;Standing       Lower Body Bathing: Minimal assistance;Sit to/from stand Lower Body Bathing Details (indicate cue type and reason): instructed in use of long handled bath sponge, pt has one at home     Lower Body Dressing: Minimal assistance;Sit to/from stand Lower Body Dressing Details (indicate cue type and reason): Instructed pt in use of reacher, sock aide, long shoe horn and that pt can purchase in gift shop. Toilet Transfer: Supervision/safety;Comfort height  toilet;Ambulation;Grab bars   Toileting- Clothing Manipulation and Hygiene: Supervision/safety;Sit to/from stand       Functional mobility during ADLs: Supervision/safety;Rolling walker        Vision                     Perception     Praxis      Cognition   Behavior During Therapy: WFL for tasks assessed/performed Overall Cognitive Status: Within Functional Limits for tasks assessed                       Extremity/Trunk Assessment               Exercises     Shoulder Instructions       General Comments      Pertinent Vitals/ Pain       Pain Assessment: 0-10 Pain Score: 5  Pain Location: R knee Pain Descriptors / Indicators: Aching Pain Intervention(s): Monitored during session;Premedicated before session;Repositioned;Ice applied  Home Living                                          Prior Functioning/Environment              Frequency Min 2X/week     Progress  Toward Goals  OT Goals(current goals can now be found in the care plan section)  Progress towards OT goals: Goals met and updated - see care plan  Acute Rehab OT Goals Patient Stated Goal: to go home safely  Plan Discharge plan remains appropriate    Co-evaluation                 End of Session Equipment Utilized During Treatment: Gait belt;Rolling walker;Right knee immobilizer   Activity Tolerance Patient tolerated treatment well   Patient Left in chair;with call bell/phone within reach;with nursing/sitter in room   Nurse Communication  (does not want 3 in 1)        Time: 1105-1145 OT Time Calculation (min): 40 min  Charges: OT General Charges $OT Visit: 1 Procedure OT Treatments $Self Care/Home Management : 23-37 mins  Malka So 04/11/2014, 12:15 PM  [phone]

## 2014-04-11 NOTE — Anesthesia Postprocedure Evaluation (Signed)
Anesthesia Post Note  Patient: Jessica Good  Procedure(s) Performed: Procedure(s) (LRB): TOTAL KNEE ARTHROPLASTY (Right)  Anesthesia type: General  Patient location: PACU  Post pain: Pain level controlled and Adequate analgesia  Post assessment: Post-op Vital signs reviewed, Patient's Cardiovascular Status Stable, Respiratory Function Stable, Patent Airway and Pain level controlled  Last Vitals:  Filed Vitals:   04/11/14 0615  BP: 135/61  Pulse: 89  Temp: 36.2 C  Resp: 16    Post vital signs: Reviewed and stable  Level of consciousness: awake, alert  and oriented  Complications: No apparent anesthesia complications

## 2014-04-15 ENCOUNTER — Encounter (HOSPITAL_COMMUNITY): Payer: Self-pay | Admitting: Orthopedic Surgery

## 2014-05-06 ENCOUNTER — Other Ambulatory Visit: Payer: Self-pay | Admitting: Orthopedic Surgery

## 2014-05-06 DIAGNOSIS — M5416 Radiculopathy, lumbar region: Secondary | ICD-10-CM

## 2014-05-06 DIAGNOSIS — M79604 Pain in right leg: Secondary | ICD-10-CM

## 2014-05-07 ENCOUNTER — Other Ambulatory Visit: Payer: Self-pay | Admitting: Orthopedic Surgery

## 2014-05-07 ENCOUNTER — Ambulatory Visit
Admission: RE | Admit: 2014-05-07 | Discharge: 2014-05-07 | Disposition: A | Discharge status: Home / Self Care | Payer: BC Managed Care – PPO | Source: Ambulatory Visit | Attending: Orthopedic Surgery | Admitting: Orthopedic Surgery

## 2014-05-07 DIAGNOSIS — M79604 Pain in right leg: Secondary | ICD-10-CM

## 2014-05-07 DIAGNOSIS — M1710 Unilateral primary osteoarthritis, unspecified knee: Secondary | ICD-10-CM

## 2014-05-07 DIAGNOSIS — M5416 Radiculopathy, lumbar region: Secondary | ICD-10-CM

## 2014-05-07 MED ORDER — IOHEXOL 180 MG/ML  SOLN
1.0000 mL | Freq: Once | INTRAMUSCULAR | Status: AC | PRN
  Administered 2014-05-07: 1 mL via EPIDURAL

## 2014-05-07 MED ORDER — METHYLPREDNISOLONE ACETATE 40 MG/ML INJ SUSP (RADIOLOG
120.0000 mg | Freq: Once | INTRAMUSCULAR | Status: AC
  Administered 2014-05-07: 120 mg via EPIDURAL

## 2014-05-07 NOTE — Discharge Instructions (Signed)

## 2014-11-04 ENCOUNTER — Other Ambulatory Visit (INDEPENDENT_AMBULATORY_CARE_PROVIDER_SITE_OTHER): Payer: BLUE CROSS/BLUE SHIELD

## 2014-11-04 ENCOUNTER — Ambulatory Visit (INDEPENDENT_AMBULATORY_CARE_PROVIDER_SITE_OTHER): Payer: BLUE CROSS/BLUE SHIELD | Admitting: Internal Medicine

## 2014-11-04 ENCOUNTER — Encounter: Payer: Self-pay | Admitting: Internal Medicine

## 2014-11-04 VITALS — BP 132/84 | HR 64 | Ht 61.5 in | Wt 247.0 lb

## 2014-11-04 DIAGNOSIS — Z86010 Personal history of colonic polyps: Secondary | ICD-10-CM | POA: Diagnosis not present

## 2014-11-04 DIAGNOSIS — R195 Other fecal abnormalities: Secondary | ICD-10-CM | POA: Diagnosis not present

## 2014-11-04 DIAGNOSIS — R1012 Left upper quadrant pain: Secondary | ICD-10-CM | POA: Diagnosis not present

## 2014-11-04 DIAGNOSIS — K219 Gastro-esophageal reflux disease without esophagitis: Secondary | ICD-10-CM

## 2014-11-04 LAB — CBC WITH DIFFERENTIAL/PLATELET
Basophils Absolute: 0 10*3/uL (ref 0.0–0.1)
Basophils Relative: 0.4 % (ref 0.0–3.0)
EOS ABS: 0.1 10*3/uL (ref 0.0–0.7)
Eosinophils Relative: 1.4 % (ref 0.0–5.0)
HEMATOCRIT: 40.4 % (ref 36.0–46.0)
Hemoglobin: 13.5 g/dL (ref 12.0–15.0)
LYMPHS PCT: 28.4 % (ref 12.0–46.0)
Lymphs Abs: 1.5 10*3/uL (ref 0.7–4.0)
MCHC: 33.5 g/dL (ref 30.0–36.0)
MCV: 90.8 fl (ref 78.0–100.0)
MONO ABS: 0.3 10*3/uL (ref 0.1–1.0)
MONOS PCT: 5.4 % (ref 3.0–12.0)
NEUTROS ABS: 3.4 10*3/uL (ref 1.4–7.7)
Neutrophils Relative %: 64.4 % (ref 43.0–77.0)
Platelets: 238 10*3/uL (ref 150.0–400.0)
RBC: 4.45 Mil/uL (ref 3.87–5.11)
RDW: 13.3 % (ref 11.5–15.5)
WBC: 5.3 10*3/uL (ref 4.0–10.5)

## 2014-11-04 LAB — LIPASE: Lipase: 27 U/L (ref 11.0–59.0)

## 2014-11-04 LAB — COMPREHENSIVE METABOLIC PANEL
ALT: 6 U/L (ref 0–35)
AST: 13 U/L (ref 0–37)
Albumin: 4 g/dL (ref 3.5–5.2)
Alkaline Phosphatase: 101 U/L (ref 39–117)
BUN: 15 mg/dL (ref 6–23)
CALCIUM: 9.4 mg/dL (ref 8.4–10.5)
CHLORIDE: 104 meq/L (ref 96–112)
CO2: 32 mEq/L (ref 19–32)
CREATININE: 0.77 mg/dL (ref 0.40–1.20)
GFR: 80.01 mL/min (ref >60.00)
Glucose, Bld: 119 mg/dL — ABNORMAL HIGH (ref 70–99)
Potassium: 4 mEq/L (ref 3.5–5.1)
Sodium: 141 mEq/L (ref 135–145)
Total Bilirubin: 0.5 mg/dL (ref 0.2–1.2)
Total Protein: 6.9 g/dL (ref 6.0–8.3)

## 2014-11-04 LAB — IGA: IgA: 101 mg/dL (ref 68–378)

## 2014-11-04 MED ORDER — PANTOPRAZOLE SODIUM 40 MG PO TBEC: 40.0000 mg | DELAYED_RELEASE_TABLET | Freq: Two times a day (BID) | ORAL | Status: DC

## 2014-11-04 MED ORDER — SUCRALFATE 1 GM/10ML PO SUSP: 1.0000 g | Freq: Three times a day (TID) | ORAL | Status: DC

## 2014-11-04 NOTE — Patient Instructions (Addendum)
You have been scheduled for an abdominal ultrasound at [facility] (1st floor of hospital) on 11/12/2014 at 9:00am. Please arrive 15 minutes prior to your appointment for registration. Make certain not to have anything to eat or drink 6 hours prior to your appointment. Should you need to reschedule your appointment, please contact radiology at [phone]. This test typically takes about 30 minutes to perform.  Your physician has requested that you go to the basement for lab work before leaving today.  You have been scheduled for an endoscopy. Please follow written instructions given to you at your visit today. If you use inhalers (even only as needed), please bring them with you on the day of your procedure. Your physician has requested that you go to www.startemmi.com and enter the access code given to you at your visit today. This web site gives a general overview about your procedure. However, you should still follow specific instructions given to you by our office regarding your preparation for the procedure.  Discontinue Dexilant and start Protonix 40mg  twice a day before meals.   We have sent medications to your pharmacy for you to pick up at your convenience.

## 2014-11-04 NOTE — Progress Notes (Signed)
Subjective:    Patient ID: Jessica Good, female    DOB: 1950-12-12, 64 y.o.   MRN: [id number]  HPI Brandii scanners a 64 year old female with past medical history of adenomatous colon polyps, prior constipation in the setting of narcotic use, hypertension, diabetes,osteoarthritis and anxiety who seen in follow-up.  After her initial visit with me she had a colonoscopy on 03/29/2014 which revealed 2 sessile polyps in the transverse colon. These were removed. One of which was found to be a tubular adenoma. She had a total knee replacement and has weaned off narcotics. She took lubiprostone for constipation initially but this made her "very sick". Bowel movements most recently have been formed and normal for her. She does report rare fecal smearing which seems to happen when her stools are loose. She endorses irritable bowel and at times can have loose stools. She's also seen some light or tan colored stools. She denies rectal bleeding or melena.  Her biggest complaint of recent has been severe acid reflux. This wakes her up at night and she also notes nocturnal coughing. She feels water brash and burning chest pain. Associated with nausea but no vomiting. She is able to eat within 30 minutes has reflux. She's taken PPI and most recently samples of Dexilant 60 mg daily. She has not found this to be very helpful. She's tried Gaviscon as many as 3-4 doses per day. Initially this seemed to help a lot but doesn't help as much recently. She denies abdominal pain other than occasional left upper quadrant discomfort. She has lipomas and feels a lipoma in her left upper abdomen which she wonders if this is the source for pain. She denies dysphagia and odynophagia   Review of Systems As per history of present illness, otherwise negative  Current Medications, Allergies, Past Medical History, Past Surgical History, Family History and Social History were reviewed in [employer] record.    Objective:   Physical Exam BP 132/84 mmHg  Pulse 64  Ht 5' 1.5" (1.562 m)  Wt 247 lb (112.038 kg)  BMI 45.92 kg/m2 Constitutional: Well-developed and well-nourished. No distress. HEENT: Normocephalic and atraumatic. Oropharynx is clear and moist. No oropharyngeal exudate. Conjunctivae are normal.  No scleral icterus. Neck: Neck supple. Trachea midline. Cardiovascular: Normal rate, regular rhythm and intact distal pulses.  Pulmonary/chest: Effort normal and breath sounds normal. No wheezing, rales or rhonchi. Abdominal: Soft, obese, nontender, nondistended. Bowel sounds active throughout. Small lipoma LUQ moveable and nontender about 2 cm in size Extremities: no clubbing, cyanosis, with 1+ edema at shin Lymphadenopathy: No cervical adenopathy noted. Neurological: Alert and oriented to person place and time. Skin: Skin is warm and dry. No rashes noted. Psychiatric: Normal mood and affect. Behavior is normal.  CBC    Component Value Date/Time   WBC 5.3 11/04/2014 1602   RBC 4.45 11/04/2014 1602   HGB 13.5 11/04/2014 1602   HCT 40.4 11/04/2014 1602   PLT 238.0 11/04/2014 1602   MCV 90.8 11/04/2014 1602   MCH 30.1 04/03/2014 1507   MCHC 33.5 11/04/2014 1602   RDW 13.3 11/04/2014 1602   LYMPHSABS 1.5 11/04/2014 1602   MONOABS 0.3 11/04/2014 1602   EOSABS 0.1 11/04/2014 1602   BASOSABS 0.0 11/04/2014 1602    CMP     Component Value Date/Time   NA 141 11/04/2014 1602   K 4.0 11/04/2014 1602   CL 104 11/04/2014 1602   CO2 32 11/04/2014 1602   GLUCOSE 119* 11/04/2014 1602   BUN  15 11/04/2014 1602   CREATININE 0.77 11/04/2014 1602   CALCIUM 9.4 11/04/2014 1602   PROT 6.9 11/04/2014 1602   ALBUMIN 4.0 11/04/2014 1602   AST 13 11/04/2014 1602   ALT 6 11/04/2014 1602   ALKPHOS 101 11/04/2014 1602   BILITOT 0.5 11/04/2014 1602   GFRNONAA >90 04/03/2014 1507   GFRAA >90 04/03/2014 1507       Assessment & Plan:  64 year old female with past medical history of adenomatous  colon polyps, prior constipation in the setting of narcotic use, hypertension, diabetes,osteoarthritis and anxiety who seen in follow-up.  1. GERD/LUQ pain -- reflux despite Dexilant 60 mg daily. This medication is expensive and not well covered by her insurance. Change to pantoprazole 40 mg twice a day before meals. Will add liquid Carafate 1 g before meals and at bedtime. Upper endoscopy recommended given symptoms and nonresponse to PPI. Also check CBC, CMP, lipase and celiac panel. Abdominal ultrasound to evaluate left-sided abdominal pain. Light-colored stools felt unlikely to be biliary in nature, but will check liver enzymes and evaluate gallbladder with ultrasound  2. Constipation -- resolved off narcotics. Occasional loose stools likely related IBS, not currently a problem  3. History of adenomatous colon polyps -- repeat colonoscopy recommended in 5 years from prior which would be November 2020

## 2014-11-05 ENCOUNTER — Telehealth: Payer: Self-pay

## 2014-11-05 NOTE — Telephone Encounter (Signed)
Prior authorizations form filled out via cover my meds for Carafate 1gm/42ml susp.

## 2014-11-07 NOTE — Telephone Encounter (Signed)
Received fax denial from [facility] for Carafate suspension. Per [employer] the alternative is Carafate 1mg  tablets. Can we send this instead Dr. Hilarie Fredrickson?

## 2014-11-07 NOTE — Telephone Encounter (Signed)
Okay for Carafate 1g tablets to be made into a slurry before meals and at bedtime Please instruct patient how to make slurry

## 2014-11-08 MED ORDER — SUCRALFATE 1 G PO TABS: 1.0000 g | ORAL_TABLET | Freq: Three times a day (TID) | ORAL | Status: DC

## 2014-11-08 NOTE — Telephone Encounter (Signed)
Informed patient that her insurance company will cover the tablets and not the suspension but I explained to patient how to make it a slurry. Pt verbalized understanding.

## 2014-11-08 NOTE — Addendum Note (Signed)
Addended by: Marzella Schlein on: 11/08/2014 01:30 PM   Modules accepted: Orders, Medications

## 2014-11-08 NOTE — H&P (Signed)
TOTAL KNEE ADMISSION H&P  Patient is being admitted for left total knee arthroplasty.  Subjective:  Chief Complaint:left knee pain.  HPI: Jessica Good, 64 y.o. female, has a history of pain and functional disability in the left knee due to arthritis and has failed non-surgical conservative treatments for greater than 12 weeks to includeNSAID's and/or analgesics, corticosteriod injections, supervised PT with diminished ADL's post treatment, use of assistive devices and weight reduction as appropriate.  Onset of symptoms was gradual, starting 2 years ago with gradually worsening course since that time. The patient noted no past surgery on the left knee(s).  Patient currently rates pain in the left knee(s) at 4 out of 10 with activity. Patient has night pain, worsening of pain with activity and weight bearing, pain that interferes with activities of daily living, pain with passive range of motion and joint swelling.  Patient has evidence of joint space narrowing by imaging studies. There is no active infection.  Patient Active Problem List   Diagnosis Date Noted  . Arthritis of knee 04/09/2014  . Constipation due to opioid therapy 02/13/2014  . Gastroesophageal reflux disease without esophagitis 02/13/2014   Past Medical History  Diagnosis Date  . Hypertension   . Anxiety   . Osteoarthritis   . GERD (gastroesophageal reflux disease)   . Diabetes mellitus without complication     boarderline  . Constipation due to pain medication   . Urgency of urination   . Edema of lower extremity     Takes lasix PRN  . Hiatal hernia   . IBS (irritable bowel syndrome)     Past Surgical History  Procedure Laterality Date  . Ankle surgery Left   . Colonoscopy w/ polypectomy    . 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 Total knee arthroplasty Right 04/09/2014    Procedure: TOTAL KNEE ARTHROPLASTY;  Surgeon: Renette Butters, MD;  Location: [facility];  Service: Orthopedics;   Laterality: Right;    No prescriptions prior to admission   Allergies  Allergen Reactions  . Sulfa Antibiotics     REACTION: Flu-like symptoms  . Other Rash    Adhesive on Bandaids causes "rash" on skin    History  Substance Use Topics  . Smoking status: Never Smoker   . Smokeless tobacco: Never Used  . Alcohol Use: No    Family History  Problem Relation Age of Onset  . Diabetes Maternal Grandmother   . Breast cancer Neg Hx   . Celiac disease Neg Hx   . Cirrhosis Neg Hx   . Clotting disorder Neg Hx   . Colitis Neg Hx   . Colon cancer Neg Hx   . Colon polyps Neg Hx   . Crohn's disease Neg Hx   . Cystic fibrosis Neg Hx   . Esophageal cancer Neg Hx   . Hemochromatosis Neg Hx   . Inflammatory bowel disease Neg Hx   . Irritable bowel syndrome Neg Hx   . Kidney disease Neg Hx   . Liver cancer Neg Hx   . Liver disease Neg Hx   . Ovarian cancer Neg Hx   . Pancreatic cancer Neg Hx   . Prostate cancer Neg Hx   . Rectal cancer Neg Hx   . Stomach cancer Neg Hx   . Ulcerative colitis Neg Hx   . Uterine cancer Neg Hx   . Wilson's disease Neg Hx   . Hypertension Neg Hx   . Diabetes Father   .  Heart disease Father      Review of Systems  Constitutional: Negative for fever and chills.  HENT: Negative for congestion and ear pain.   Eyes: Negative for blurred vision and pain.  Respiratory: Negative for cough and shortness of breath.   Cardiovascular: Negative for chest pain, palpitations and leg swelling.  Gastrointestinal: Negative for nausea, vomiting and abdominal pain.  Musculoskeletal:       Left knee pain with ambulation  Skin: Negative for rash.  Neurological: Negative for dizziness and seizures.  Psychiatric/Behavioral: Negative for depression and suicidal ideas.    Objective:  Physical Exam  Constitutional: She is oriented to person, place, and time. She appears well-developed and well-nourished.  HENT:  Head: Normocephalic and atraumatic.  Eyes:  Conjunctivae and EOM are normal. Pupils are equal, round, and reactive to light.  Neck: Normal range of motion. Neck supple.  Cardiovascular: Normal rate and intact distal pulses.   Respiratory: Effort normal and breath sounds normal.  GI: Soft. Bowel sounds are normal.  Musculoskeletal: She exhibits tenderness (MJL ).  Neurological: She is alert and oriented to person, place, and time. She has normal reflexes.  Skin: Skin is warm and dry.  Psychiatric: She has a normal mood and affect. Her behavior is normal. Judgment and thought content normal.    Vital signs in last 24 hours:    Labs:   Estimated body mass index is 46.41 kg/(m^2) as calculated from the following:   Height as of 04/09/14: 5\' 1"  (1.549 m).   Weight as of 04/03/14: 111.358 kg (245 lb 8 oz).   Imaging Review Plain radiographs demonstrate severe degenerative joint disease of the left knee(s). The overall alignment ismild valgus. The bone quality appears to be fair for age and reported activity level.  Assessment/Plan:  End stage arthritis, left knee   The patient history, physical examination, clinical judgment of the provider and imaging studies are consistent with end stage degenerative joint disease of the left knee(s) and total knee arthroplasty is deemed medically necessary. The treatment options including medical management, injection therapy arthroscopy and arthroplasty were discussed at length. The risks and benefits of total knee arthroplasty were presented and reviewed. The risks due to aseptic loosening, infection, stiffness, patella tracking problems, thromboembolic complications and other imponderables were discussed. The patient acknowledged the explanation, agreed to proceed with the plan and consent was signed. Patient is being admitted for inpatient treatment for surgery, pain control, PT, OT, prophylactic antibiotics, VTE prophylaxis, progressive ambulation and ADL's and discharge planning. The patient is  planning to be discharged home with home health services

## 2014-11-12 ENCOUNTER — Telehealth: Payer: Self-pay | Admitting: Internal Medicine

## 2014-11-12 ENCOUNTER — Ambulatory Visit (HOSPITAL_COMMUNITY)
Admission: RE | Admit: 2014-11-12 | Discharge: 2014-11-12 | Disposition: A | Discharge status: Home / Self Care | Payer: BLUE CROSS/BLUE SHIELD | Source: Ambulatory Visit | Attending: Internal Medicine | Admitting: Internal Medicine

## 2014-11-12 DIAGNOSIS — R1012 Left upper quadrant pain: Secondary | ICD-10-CM

## 2014-11-12 DIAGNOSIS — K8020 Calculus of gallbladder without cholecystitis without obstruction: Secondary | ICD-10-CM | POA: Diagnosis not present

## 2014-11-12 DIAGNOSIS — K219 Gastro-esophageal reflux disease without esophagitis: Secondary | ICD-10-CM

## 2014-11-12 DIAGNOSIS — R195 Other fecal abnormalities: Secondary | ICD-10-CM

## 2014-11-12 NOTE — Telephone Encounter (Signed)
Spoke with pt and she is aware of results. See result note.

## 2014-11-12 NOTE — Telephone Encounter (Signed)
Pt calling for [place] results that was done today. Please advise.

## 2014-11-12 NOTE — Telephone Encounter (Signed)
See result note.  

## 2014-11-15 ENCOUNTER — Other Ambulatory Visit (HOSPITAL_COMMUNITY): Payer: Self-pay | Admitting: *Deleted

## 2014-11-15 NOTE — Pre-Procedure Instructions (Addendum)
    Jessica Good  11/15/2014     Your procedure is scheduled on Tuesday, November 26, 2014 at 10:20 AM.   Report to [facility] Entrance "A" Admitting Office at 8:15 AM.   Call this number if you have problems the morning of surgery: [phone]   Any questions prior to day of surgery, please call [phone] between 8 & 4 PM.    Remember:  Do not eat food or drink liquids after midnight Monday, 11/25/14.  Take these medicines the morning of surgery with A SIP OF WATER:  PANTOPRAZOLE (PROTONIX) (STOP ASPIRIN,  VITAMINS )  Stop Aspirin and Multivitamins 7 days prior to surgery.   Do not wear jewelry, make-up or nail polish.  Do not wear lotions, powders, or perfumes.  You may wear deodorant.  Do not shave 48 hours prior to surgery.    Do not bring valuables to the hospital.  [facility] is not responsible for any belongings or valuables.  Contacts, dentures or bridgework may not be worn into surgery.  Leave your suitcase in the car.  After surgery it may be brought to your room.  For patients admitted to the hospital, discharge time will be determined by your treatment team.  Special instructions:  See "Preparing for Surgery" Instruction sheet.    Please read over the following fact sheets that you were given. Pain Booklet, Coughing and Deep Breathing, Blood Transfusion Information, MRSA Information and Surgical Site Infection Prevention

## 2014-11-18 ENCOUNTER — Encounter (HOSPITAL_COMMUNITY): Payer: Self-pay

## 2014-11-18 ENCOUNTER — Encounter (HOSPITAL_COMMUNITY)
Admission: RE | Admit: 2014-11-18 | Discharge: 2014-11-18 | Disposition: A | Discharge status: Home / Self Care | Payer: BLUE CROSS/BLUE SHIELD | Source: Ambulatory Visit | Attending: Orthopedic Surgery | Admitting: Orthopedic Surgery

## 2014-11-18 DIAGNOSIS — Z01812 Encounter for preprocedural laboratory examination: Secondary | ICD-10-CM | POA: Diagnosis present

## 2014-11-18 DIAGNOSIS — Z0183 Encounter for blood typing: Secondary | ICD-10-CM | POA: Diagnosis not present

## 2014-11-18 DIAGNOSIS — M179 Osteoarthritis of knee, unspecified: Secondary | ICD-10-CM | POA: Diagnosis not present

## 2014-11-18 LAB — PROTIME-INR
INR: 1.1 (ref 0.00–1.49)
Prothrombin Time: 14.4 seconds (ref 11.6–15.2)

## 2014-11-18 LAB — BASIC METABOLIC PANEL
ANION GAP: 6 (ref 5–15)
BUN: 12 mg/dL (ref 6–20)
CALCIUM: 9.4 mg/dL (ref 8.9–10.3)
CHLORIDE: 107 mmol/L (ref 101–111)
CO2: 29 mmol/L (ref 22–32)
Creatinine, Ser: 0.75 mg/dL (ref 0.44–1.00)
GFR calc Af Amer: >60 mL/min (ref >60)
GFR calc non Af Amer: >60 mL/min (ref >60)
GLUCOSE: 134 mg/dL — AB (ref 65–99)
Potassium: 4.5 mmol/L (ref 3.5–5.1)
SODIUM: 142 mmol/L (ref 135–145)

## 2014-11-18 LAB — CBC
HEMATOCRIT: 40.8 % (ref 36.0–46.0)
Hemoglobin: 13.3 g/dL (ref 12.0–15.0)
MCH: 30 pg (ref 26.0–34.0)
MCHC: 32.6 g/dL (ref 30.0–36.0)
MCV: 91.9 fL (ref 78.0–100.0)
Platelets: 198 10*3/uL (ref 150–400)
RBC: 4.44 MIL/uL (ref 3.87–5.11)
RDW: 12.7 % (ref 11.5–15.5)
WBC: 4.8 10*3/uL (ref 4.0–10.5)

## 2014-11-18 LAB — TYPE AND SCREEN
ABO/RH(D): B POS
Antibody Screen: NEGATIVE

## 2014-11-18 LAB — GLUCOSE, CAPILLARY: GLUCOSE-CAPILLARY: 111 mg/dL — AB (ref 65–99)

## 2014-11-18 LAB — SURGICAL PCR SCREEN
MRSA, PCR: NEGATIVE
Staphylococcus aureus: NEGATIVE

## 2014-11-18 NOTE — Progress Notes (Signed)
   11/18/14 0830  OBSTRUCTIVE SLEEP APNEA  Have you ever been diagnosed with sleep apnea through a sleep study? No  Do you snore loudly (loud enough to be heard through closed doors)?  0  Do you often feel tired, fatigued, or sleepy during the daytime? 1  Has anyone observed you stop breathing during your sleep? 0  Do you have, or are you being treated for high blood pressure? 1  BMI more than 35 kg/m2? 1  Age over 65 years old? 1  Neck circumference greater than 40 cm/16 inches? 0  Gender: 0

## 2014-11-19 LAB — HEMOGLOBIN A1C
HEMOGLOBIN A1C: 6.3 % — AB (ref 4.8–5.6)
Mean Plasma Glucose: 134 mg/dL

## 2014-11-20 LAB — URINE CULTURE

## 2014-11-25 MED ORDER — TRANEXAMIC ACID 1000 MG/10ML IV SOLN
1000.0000 mg | INTRAVENOUS | Status: AC
  Administered 2014-11-26 (×2): 1000 mg via INTRAVENOUS
  Filled 2014-11-25: qty 10

## 2014-11-25 MED ORDER — CEFAZOLIN SODIUM-DEXTROSE 2-3 GM-% IV SOLR
2.0000 g | INTRAVENOUS | Status: AC
  Administered 2014-11-26: 2 g via INTRAVENOUS
  Filled 2014-11-25: qty 50

## 2014-11-25 MED ORDER — POTASSIUM CHLORIDE IN NACL 20-0.45 MEQ/L-% IV SOLN
INTRAVENOUS | Status: DC
  Filled 2014-11-25: qty 1000

## 2014-11-25 MED ORDER — ACETAMINOPHEN 500 MG PO TABS
1000.0000 mg | ORAL_TABLET | Freq: Once | ORAL | Status: AC
  Administered 2014-11-26: 1000 mg via ORAL
  Filled 2014-11-25: qty 2

## 2014-11-26 ENCOUNTER — Encounter (HOSPITAL_COMMUNITY)
Admission: AD | Disposition: A | Discharge status: Home Health | Payer: Self-pay | Source: Ambulatory Visit | Attending: Orthopedic Surgery

## 2014-11-26 ENCOUNTER — Ambulatory Visit (HOSPITAL_COMMUNITY): Payer: BLUE CROSS/BLUE SHIELD | Admitting: Anesthesiology

## 2014-11-26 ENCOUNTER — Inpatient Hospital Stay (HOSPITAL_COMMUNITY): Payer: BLUE CROSS/BLUE SHIELD

## 2014-11-26 ENCOUNTER — Encounter (HOSPITAL_COMMUNITY): Payer: Self-pay | Admitting: *Deleted

## 2014-11-26 ENCOUNTER — Inpatient Hospital Stay (HOSPITAL_COMMUNITY)
Admission: AD | Admit: 2014-11-26 | Discharge: 2014-11-28 | DRG: 470 | Disposition: A | Discharge status: Home Health | Payer: BLUE CROSS/BLUE SHIELD | Source: Ambulatory Visit | Attending: Orthopedic Surgery | Admitting: Orthopedic Surgery

## 2014-11-26 DIAGNOSIS — Z96652 Presence of left artificial knee joint: Secondary | ICD-10-CM

## 2014-11-26 DIAGNOSIS — K219 Gastro-esophageal reflux disease without esophagitis: Secondary | ICD-10-CM | POA: Diagnosis present

## 2014-11-26 DIAGNOSIS — M1712 Unilateral primary osteoarthritis, left knee: Principal | ICD-10-CM | POA: Diagnosis present

## 2014-11-26 DIAGNOSIS — E119 Type 2 diabetes mellitus without complications: Secondary | ICD-10-CM | POA: Diagnosis present

## 2014-11-26 DIAGNOSIS — Z6841 Body Mass Index (BMI) 40.0 and over, adult: Secondary | ICD-10-CM

## 2014-11-26 DIAGNOSIS — M25562 Pain in left knee: Secondary | ICD-10-CM | POA: Diagnosis present

## 2014-11-26 DIAGNOSIS — I10 Essential (primary) hypertension: Secondary | ICD-10-CM | POA: Diagnosis present

## 2014-11-26 DIAGNOSIS — Z23 Encounter for immunization: Secondary | ICD-10-CM | POA: Diagnosis not present

## 2014-11-26 DIAGNOSIS — M1710 Unilateral primary osteoarthritis, unspecified knee: Secondary | ICD-10-CM | POA: Diagnosis present

## 2014-11-26 HISTORY — PX: PARTIAL KNEE ARTHROPLASTY: SHX2174

## 2014-11-26 LAB — GLUCOSE, CAPILLARY
Glucose-Capillary: 127 mg/dL — ABNORMAL HIGH (ref 65–99)
Glucose-Capillary: 131 mg/dL — ABNORMAL HIGH (ref 65–99)

## 2014-11-26 SURGERY — ARTHROPLASTY, KNEE, UNICOMPARTMENTAL
Anesthesia: General | Site: Knee | Laterality: Left

## 2014-11-26 MED ORDER — ONDANSETRON HCL 4 MG/2ML IJ SOLN
INTRAMUSCULAR | Status: AC
  Filled 2014-11-26: qty 2

## 2014-11-26 MED ORDER — CELECOXIB 200 MG PO CAPS
200.0000 mg | ORAL_CAPSULE | Freq: Two times a day (BID) | ORAL | Status: DC
Start: 2014-11-26 — End: 2014-11-28
  Filled 2014-11-26: qty 1

## 2014-11-26 MED ORDER — ASPIRIN 325 MG PO TABS: 325.0000 mg | ORAL_TABLET | Freq: Every day | ORAL | Status: DC

## 2014-11-26 MED ORDER — SODIUM CHLORIDE 0.9 % IR SOLN
Status: DC | PRN
  Administered 2014-11-26: 3000 mL

## 2014-11-26 MED ORDER — PHENOL 1.4 % MT LIQD
1.0000 | OROMUCOSAL | Status: DC | PRN
  Administered 2014-11-26: 1 via OROMUCOSAL
  Filled 2014-11-26: qty 177

## 2014-11-26 MED ORDER — DEXAMETHASONE SODIUM PHOSPHATE 10 MG/ML IJ SOLN
10.0000 mg | Freq: Once | INTRAMUSCULAR | Status: AC
  Administered 2014-11-27: 10 mg via INTRAVENOUS
  Filled 2014-11-26: qty 1

## 2014-11-26 MED ORDER — MENTHOL 3 MG MT LOZG: 1.0000 | LOZENGE | OROMUCOSAL | Status: DC | PRN

## 2014-11-26 MED ORDER — OXYCODONE-ACETAMINOPHEN 5-325 MG PO TABS: 1.0000 | ORAL_TABLET | ORAL | Status: DC | PRN

## 2014-11-26 MED ORDER — FENTANYL CITRATE (PF) 250 MCG/5ML IJ SOLN
INTRAMUSCULAR | Status: AC
  Filled 2014-11-26: qty 5

## 2014-11-26 MED ORDER — ROCURONIUM BROMIDE 100 MG/10ML IV SOLN
INTRAVENOUS | Status: DC | PRN
  Administered 2014-11-26: 30 mg via INTRAVENOUS

## 2014-11-26 MED ORDER — ONDANSETRON HCL 4 MG/2ML IJ SOLN
4.0000 mg | Freq: Four times a day (QID) | INTRAMUSCULAR | Status: DC | PRN
  Administered 2014-11-26: 4 mg via INTRAVENOUS
  Filled 2014-11-26: qty 2

## 2014-11-26 MED ORDER — OXYCODONE HCL 5 MG PO TABS
ORAL_TABLET | ORAL | Status: AC
  Filled 2014-11-26: qty 1

## 2014-11-26 MED ORDER — SUCRALFATE 1 G PO TABS
1.0000 g | ORAL_TABLET | Freq: Three times a day (TID) | ORAL | Status: DC
  Filled 2014-11-26 (×2): qty 1

## 2014-11-26 MED ORDER — GLYCOPYRROLATE 0.2 MG/ML IJ SOLN
INTRAMUSCULAR | Status: DC | PRN
  Administered 2014-11-26: 0.6 mg via INTRAVENOUS

## 2014-11-26 MED ORDER — CEFAZOLIN SODIUM-DEXTROSE 2-3 GM-% IV SOLR
2.0000 g | Freq: Four times a day (QID) | INTRAVENOUS | Status: AC
  Administered 2014-11-26 (×2): 2 g via INTRAVENOUS
  Filled 2014-11-26 (×2): qty 50

## 2014-11-26 MED ORDER — METHOCARBAMOL 500 MG PO TABS
500.0000 mg | ORAL_TABLET | Freq: Four times a day (QID) | ORAL | Status: DC | PRN
  Administered 2014-11-26 – 2014-11-28 (×4): 500 mg via ORAL
  Filled 2014-11-26 (×5): qty 1

## 2014-11-26 MED ORDER — METOCLOPRAMIDE HCL 5 MG PO TABS: 5.0000 mg | ORAL_TABLET | Freq: Three times a day (TID) | ORAL | Status: DC | PRN

## 2014-11-26 MED ORDER — LACTATED RINGERS IV SOLN
INTRAVENOUS | Status: DC
  Administered 2014-11-26 (×3): via INTRAVENOUS

## 2014-11-26 MED ORDER — OXYCODONE HCL 5 MG PO TABS
5.0000 mg | ORAL_TABLET | ORAL | Status: DC | PRN
  Administered 2014-11-26 (×2): 10 mg via ORAL
  Administered 2014-11-26: 5 mg via ORAL
  Administered 2014-11-27 – 2014-11-28 (×9): 10 mg via ORAL
  Filled 2014-11-26 (×11): qty 2

## 2014-11-26 MED ORDER — PROPOFOL 10 MG/ML IV BOLUS
INTRAVENOUS | Status: AC
  Filled 2014-11-26: qty 20

## 2014-11-26 MED ORDER — MORPHINE SULFATE 2 MG/ML IJ SOLN: 1.0000 mg | INTRAMUSCULAR | Status: DC | PRN

## 2014-11-26 MED ORDER — ROCURONIUM BROMIDE 50 MG/5ML IV SOLN
INTRAVENOUS | Status: AC
  Filled 2014-11-26: qty 1

## 2014-11-26 MED ORDER — PANTOPRAZOLE SODIUM 40 MG PO TBEC
40.0000 mg | DELAYED_RELEASE_TABLET | Freq: Two times a day (BID) | ORAL | Status: DC
  Administered 2014-11-26 – 2014-11-28 (×4): 40 mg via ORAL
  Filled 2014-11-26 (×4): qty 1

## 2014-11-26 MED ORDER — ACETAMINOPHEN 650 MG RE SUPP: 650.0000 mg | Freq: Four times a day (QID) | RECTAL | Status: DC | PRN

## 2014-11-26 MED ORDER — SUCCINYLCHOLINE CHLORIDE 20 MG/ML IJ SOLN
INTRAMUSCULAR | Status: DC | PRN
  Administered 2014-11-26: 120 mg via INTRAVENOUS

## 2014-11-26 MED ORDER — FUROSEMIDE 40 MG PO TABS: 40.0000 mg | ORAL_TABLET | Freq: Every day | ORAL | Status: DC | PRN

## 2014-11-26 MED ORDER — CYCLOBENZAPRINE HCL 10 MG PO TABS: 10.0000 mg | ORAL_TABLET | Freq: Three times a day (TID) | ORAL | Status: DC | PRN

## 2014-11-26 MED ORDER — VITAMIN D 1000 UNITS PO TABS
1000.0000 [IU] | ORAL_TABLET | Freq: Every day | ORAL | Status: DC
  Administered 2014-11-27 – 2014-11-28 (×2): 1000 [IU] via ORAL
  Filled 2014-11-26 (×2): qty 1

## 2014-11-26 MED ORDER — PROPOFOL 10 MG/ML IV BOLUS
INTRAVENOUS | Status: DC | PRN
  Administered 2014-11-26: 180 mg via INTRAVENOUS

## 2014-11-26 MED ORDER — POTASSIUM CHLORIDE IN NACL 20-0.45 MEQ/L-% IV SOLN
INTRAVENOUS | Status: DC
  Administered 2014-11-26 – 2014-11-27 (×2): via INTRAVENOUS
  Administered 2014-11-28: 100 mL/h via INTRAVENOUS
  Filled 2014-11-26 (×7): qty 1000

## 2014-11-26 MED ORDER — LIDOCAINE HCL (CARDIAC) 20 MG/ML IV SOLN
INTRAVENOUS | Status: DC | PRN
  Administered 2014-11-26: 50 mg via INTRAVENOUS

## 2014-11-26 MED ORDER — METHOCARBAMOL 1000 MG/10ML IJ SOLN
500.0000 mg | Freq: Four times a day (QID) | INTRAMUSCULAR | Status: DC | PRN
  Filled 2014-11-26: qty 5

## 2014-11-26 MED ORDER — ACETAMINOPHEN 325 MG PO TABS: 650.0000 mg | ORAL_TABLET | Freq: Four times a day (QID) | ORAL | Status: DC | PRN

## 2014-11-26 MED ORDER — ONDANSETRON HCL 4 MG/2ML IJ SOLN
INTRAMUSCULAR | Status: DC | PRN
  Administered 2014-11-26: 4 mg via INTRAVENOUS

## 2014-11-26 MED ORDER — 0.9 % SODIUM CHLORIDE (POUR BTL) OPTIME
TOPICAL | Status: DC | PRN
  Administered 2014-11-26: 1000 mL

## 2014-11-26 MED ORDER — DOCUSATE SODIUM 100 MG PO CAPS: 100.0000 mg | ORAL_CAPSULE | Freq: Two times a day (BID) | ORAL | Status: DC

## 2014-11-26 MED ORDER — EPHEDRINE SULFATE 50 MG/ML IJ SOLN
INTRAMUSCULAR | Status: DC | PRN
  Administered 2014-11-26 (×2): 10 mg via INTRAVENOUS

## 2014-11-26 MED ORDER — PROMETHAZINE HCL 25 MG/ML IJ SOLN
INTRAMUSCULAR | Status: AC
  Filled 2014-11-26: qty 1

## 2014-11-26 MED ORDER — PROMETHAZINE HCL 25 MG/ML IJ SOLN
6.2500 mg | INTRAMUSCULAR | Status: DC | PRN
Start: 2014-11-26 — End: 2014-11-26
  Administered 2014-11-26: 12.5 mg via INTRAVENOUS

## 2014-11-26 MED ORDER — NEOSTIGMINE METHYLSULFATE 10 MG/10ML IV SOLN
INTRAVENOUS | Status: AC
  Filled 2014-11-26: qty 1

## 2014-11-26 MED ORDER — FENTANYL CITRATE (PF) 100 MCG/2ML IJ SOLN
INTRAMUSCULAR | Status: DC | PRN
  Administered 2014-11-26 (×4): 50 ug via INTRAVENOUS
  Administered 2014-11-26: 100 ug via INTRAVENOUS
  Administered 2014-11-26 (×2): 50 ug via INTRAVENOUS

## 2014-11-26 MED ORDER — EPHEDRINE SULFATE 50 MG/ML IJ SOLN
INTRAMUSCULAR | Status: AC
  Filled 2014-11-26: qty 2

## 2014-11-26 MED ORDER — MIDAZOLAM HCL 2 MG/2ML IJ SOLN
INTRAMUSCULAR | Status: AC
  Administered 2014-11-26: 2 mg
  Filled 2014-11-26: qty 2

## 2014-11-26 MED ORDER — ONDANSETRON HCL 4 MG PO TABS
4.0000 mg | ORAL_TABLET | Freq: Four times a day (QID) | ORAL | Status: DC | PRN
  Administered 2014-11-27: 4 mg via ORAL
  Filled 2014-11-26: qty 1

## 2014-11-26 MED ORDER — NEOSTIGMINE METHYLSULFATE 10 MG/10ML IV SOLN
INTRAVENOUS | Status: DC | PRN
  Administered 2014-11-26: 4 mg via INTRAVENOUS

## 2014-11-26 MED ORDER — METOCLOPRAMIDE HCL 5 MG/ML IJ SOLN
5.0000 mg | Freq: Three times a day (TID) | INTRAMUSCULAR | Status: DC | PRN
  Administered 2014-11-27: 10 mg via INTRAVENOUS
  Filled 2014-11-26: qty 2

## 2014-11-26 MED ORDER — LISINOPRIL 20 MG PO TABS
20.0000 mg | ORAL_TABLET | Freq: Every day | ORAL | Status: DC
  Administered 2014-11-27 – 2014-11-28 (×2): 20 mg via ORAL
  Filled 2014-11-26 (×2): qty 1

## 2014-11-26 MED ORDER — ONDANSETRON HCL 4 MG PO TABS: 4.0000 mg | ORAL_TABLET | Freq: Three times a day (TID) | ORAL | Status: DC | PRN

## 2014-11-26 MED ORDER — HYDROMORPHONE HCL 1 MG/ML IJ SOLN: 0.2500 mg | INTRAMUSCULAR | Status: DC | PRN

## 2014-11-26 MED ORDER — GLYCOPYRROLATE 0.2 MG/ML IJ SOLN
INTRAMUSCULAR | Status: AC
  Filled 2014-11-26: qty 3

## 2014-11-26 MED ORDER — SUCCINYLCHOLINE CHLORIDE 20 MG/ML IJ SOLN
INTRAMUSCULAR | Status: AC
  Filled 2014-11-26: qty 1

## 2014-11-26 MED ORDER — ASPIRIN EC 325 MG PO TBEC
325.0000 mg | DELAYED_RELEASE_TABLET | Freq: Every day | ORAL | Status: DC
  Administered 2014-11-27 – 2014-11-28 (×2): 325 mg via ORAL
  Filled 2014-11-26 (×2): qty 1

## 2014-11-26 MED ORDER — FENTANYL CITRATE (PF) 100 MCG/2ML IJ SOLN
INTRAMUSCULAR | Status: AC
  Administered 2014-11-26: 50 ug
  Filled 2014-11-26: qty 2

## 2014-11-26 MED ORDER — CHLORHEXIDINE GLUCONATE 4 % EX LIQD: 60.0000 mL | Freq: Once | CUTANEOUS | Status: DC

## 2014-11-26 MED ORDER — ROPIVACAINE HCL 5 MG/ML IJ SOLN
INTRAMUSCULAR | Status: DC | PRN
  Administered 2014-11-26: 30 mL via PERINEURAL

## 2014-11-26 MED ORDER — TRANEXAMIC ACID 1000 MG/10ML IV SOLN
1000.0000 mg | INTRAVENOUS | Status: AC
  Filled 2014-11-26: qty 10

## 2014-11-26 SURGICAL SUPPLY — 59 items
ADH SKN CLS APL DERMABOND .7 (GAUZE/BANDAGES/DRESSINGS) ×1
BANDAGE ESMARK 6X9 LF (GAUZE/BANDAGES/DRESSINGS) ×1 IMPLANT
BNDG CMPR 9X6 STRL LF SNTH (GAUZE/BANDAGES/DRESSINGS) ×1
BNDG ESMARK 6X9 LF (GAUZE/BANDAGES/DRESSINGS) ×2
BONE CEMENT PALACOSE (Orthopedic Implant) ×2 IMPLANT
BOWL SMART MIX CTS (DISPOSABLE) ×2 IMPLANT
CAPT KNEE PARTIAL 2 ×1 IMPLANT
CEMENT BONE PALACOSE (Orthopedic Implant) IMPLANT
CHLORAPREP W/TINT 26ML (MISCELLANEOUS) ×3 IMPLANT
CLSR STERI-STRIP ANTIMIC 1/2X4 (GAUZE/BANDAGES/DRESSINGS) ×1 IMPLANT
COVER SURGICAL LIGHT HANDLE (MISCELLANEOUS) ×2 IMPLANT
CUFF TOURNIQUET SINGLE 34IN LL (TOURNIQUET CUFF) ×2 IMPLANT
DERMABOND ADVANCED (GAUZE/BANDAGES/DRESSINGS) ×1
DERMABOND ADVANCED .7 DNX12 (GAUZE/BANDAGES/DRESSINGS) IMPLANT
DRAPE EXTREMITY T 121X128X90 (DRAPE) ×2 IMPLANT
DRAPE IMP U-DRAPE 54X76 (DRAPES) ×4 IMPLANT
DRAPE PROXIMA HALF (DRAPES) ×2 IMPLANT
DRAPE U-SHAPE 47X51 STRL (DRAPES) ×1 IMPLANT
DRSG MEPILEX BORDER 4X4 (GAUZE/BANDAGES/DRESSINGS) IMPLANT
DRSG MEPILEX BORDER 4X8 (GAUZE/BANDAGES/DRESSINGS) ×2 IMPLANT
ELECT CAUTERY BLADE 6.4 (BLADE) ×2 IMPLANT
ELECT REM PT RETURN 9FT ADLT (ELECTROSURGICAL) ×2
ELECTRODE REM PT RTRN 9FT ADLT (ELECTROSURGICAL) ×1 IMPLANT
EVACUATOR 1/8 PVC DRAIN (DRAIN) IMPLANT
FACESHIELD WRAPAROUND (MASK) IMPLANT
FACESHIELD WRAPAROUND OR TEAM (MASK) ×2 IMPLANT
GLOVE BIO SURGEON STRL SZ7 (GLOVE) ×4 IMPLANT
GLOVE BIO SURGEON STRL SZ7.5 (GLOVE) ×2 IMPLANT
GLOVE BIOGEL PI IND STRL 6.5 (GLOVE) IMPLANT
GLOVE BIOGEL PI IND STRL 7.0 (GLOVE) ×1 IMPLANT
GLOVE BIOGEL PI IND STRL 8 (GLOVE) ×1 IMPLANT
GLOVE BIOGEL PI INDICATOR 6.5 (GLOVE) ×1
GLOVE BIOGEL PI INDICATOR 7.0 (GLOVE) ×3
GLOVE BIOGEL PI INDICATOR 8 (GLOVE) ×2
GLOVE ECLIPSE 6.5 STRL STRAW (GLOVE) ×1 IMPLANT
GOWN STRL REUS W/ TWL LRG LVL3 (GOWN DISPOSABLE) ×1 IMPLANT
GOWN STRL REUS W/ TWL XL LVL3 (GOWN DISPOSABLE) ×1 IMPLANT
GOWN STRL REUS W/TWL LRG LVL3 (GOWN DISPOSABLE) ×4
GOWN STRL REUS W/TWL XL LVL3 (GOWN DISPOSABLE) ×4
HANDPIECE INTERPULSE COAX TIP (DISPOSABLE) ×2
IMMOBILIZER KNEE 22 UNIV (SOFTGOODS) ×2 IMPLANT
KIT BASIN OR (CUSTOM PROCEDURE TRAY) ×2 IMPLANT
KIT ROOM TURNOVER OR (KITS) ×2 IMPLANT
MANIFOLD NEPTUNE II (INSTRUMENTS) ×2 IMPLANT
NS IRRIG 1000ML POUR BTL (IV SOLUTION) ×2 IMPLANT
PACK BLADE SAW RECIP 70 3 PT (BLADE) ×1 IMPLANT
PACK TOTAL JOINT (CUSTOM PROCEDURE TRAY) ×2 IMPLANT
PACK UNIVERSAL I (CUSTOM PROCEDURE TRAY) ×1 IMPLANT
PAD ARMBOARD 7.5X6 YLW CONV (MISCELLANEOUS) ×2 IMPLANT
SET HNDPC FAN SPRY TIP SCT (DISPOSABLE) ×1 IMPLANT
STAPLER VISISTAT 35W (STAPLE) IMPLANT
SUCTION FRAZIER TIP 10 FR DISP (SUCTIONS) ×2 IMPLANT
SUT MNCRL AB 4-0 PS2 18 (SUTURE) IMPLANT
SUT MON AB 2-0 CT1 27 (SUTURE) ×4 IMPLANT
SUT MON AB 2-0 CT1 36 (SUTURE) ×2 IMPLANT
SUT VIC AB 1 CTX 36 (SUTURE) ×4
SUT VIC AB 1 CTX36XBRD ANBCTR (SUTURE) ×2 IMPLANT
TOWEL OR 17X24 6PK STRL BLUE (TOWEL DISPOSABLE) ×2 IMPLANT
TOWEL OR 17X26 10 PK STRL BLUE (TOWEL DISPOSABLE) ×2 IMPLANT

## 2014-11-26 NOTE — Plan of Care (Signed)
Problem: Consults Goal: Diagnosis- Total Joint Replacement Partial/Uniknee

## 2014-11-26 NOTE — Anesthesia Postprocedure Evaluation (Signed)
  Anesthesia Post-op Note  Patient: Jessica Good  Procedure(s) Performed: Procedure(s) (LRB): LEFT UNICOMPARTMENTAL KNEE (Left)  Patient Location: PACU  Anesthesia Type: GA combined with regional for post-op pain  Level of Consciousness: awake and alert   Airway and Oxygen Therapy: Patient Spontanous Breathing  Post-op Pain: mild  Post-op Assessment: Post-op Vital signs reviewed, Patient's Cardiovascular Status Stable, Respiratory Function Stable, Patent Airway and No signs of Nausea or vomiting  Last Vitals:  Filed Vitals:   11/26/14 1515  BP:   Pulse:   Temp: 36.2 C  Resp:     Post-op Vital Signs: stable   Complications: No apparent anesthesia complications

## 2014-11-26 NOTE — Anesthesia Procedure Notes (Addendum)
Anesthesia Regional Block:  Femoral nerve block  Pre-Anesthetic Checklist: ,, timeout performed, Correct Patient, Correct Site, Correct Laterality, Correct Procedure, Correct Position, site marked, Risks and benefits discussed,  Surgical consent,  Pre-op evaluation,  At surgeon's request and post-op pain management  Laterality: Left  Prep: chloraprep       Needles:  Injection technique: Single-shot  Needle Type: Echogenic Stimulator Needle     Needle Length: 9cm 9 cm Needle Gauge: 21 and 21 G    Additional Needles:  Procedures: ultrasound guided (picture in chart) Femoral nerve block Narrative:  Injection made incrementally with aspirations every 5 mL.  Performed by: Personally   Additional Notes: Patient tolerated the procedure well without complications   Procedure Name: Intubation Date/Time: 11/26/2014 11:17 AM Performed by: Gershon Mussel, Attikus Bartoszek Pre-anesthesia Checklist: Patient identified, Patient being monitored, Timeout performed, Emergency Drugs available and Suction available Patient Re-evaluated:Patient Re-evaluated prior to inductionOxygen Delivery Method: Circle System Utilized Preoxygenation: Pre-oxygenation with 100% oxygen Intubation Type: IV induction Ventilation: Mask ventilation without difficulty Laryngoscope Size: Miller and 2 Grade View: Grade I Tube type: Oral Tube size: 7.0 mm Number of attempts: 1 Airway Equipment and Method: Stylet Placement Confirmation: ETT inserted through vocal cords under direct vision,  positive ETCO2 and breath sounds checked- equal and bilateral Secured at: 21 cm Tube secured with: Tape Dental Injury: Teeth and Oropharynx as per pre-operative assessment

## 2014-11-26 NOTE — Op Note (Signed)
11/26/2014  12:52 PM  PATIENT:  Jessica Good    PRE-OPERATIVE DIAGNOSIS:  OA LEFT KNEE  POST-OPERATIVE DIAGNOSIS:  Same  PROCEDURE:  LEFT UNICOMPARTMENTAL KNEE  SURGEON:  Kerron Sedano, Ernesta Amble, MD  PHYSICIAN ASSISTANT: Lovett Calender, PA-C, She was present and scrubbed throughout the case, critical for completion in a timely fashion, and for retraction, instrumentation, and closure.   ANESTHESIA:   General  PREOPERATIVE INDICATIONS:  Jessica Good is a  64 y.o. female with a diagnosis of OA LEFT KNEE who failed conservative measures and elected for surgical management.    The risks benefits and alternatives were discussed with the patient preoperatively including but not limited to the risks of infection, bleeding, nerve injury, cardiopulmonary complications, blood clots, the need for revision surgery, among others, and the patient was willing to proceed.  OPERATIVE IMPLANTS: Biomet Oxford mobile bearing medial compartment arthroplasty. Femoral Component: medium. Tibial tray: B, Size 3 poly.   OPERATIVE FINDINGS: Endstage grade 4 medial compartment osteoarthritis. No significant changes in the lateral or patellofemoral joint  OPERATIVE PROCEDURE: The patient was brought to the operating room placed in supine position. General anesthesia was administered. IV antibiotics were given. The lower extremity was placed in the legholder and prepped and draped in usual sterile fashion.  Time out was performed.  The leg was elevated and exsanguinated and the tourniquet was inflated. Anteromedial incision was performed, and I took care to preserve the MCL. Parapatellar incision was carried out, and the osteophytes were excised, along with the medial meniscus and a small portion of the fat pad.  The extra medullary tibial cutting jig was applied, using the spoon and the 61mm G-Clamp, and I took care to protect the anterior cruciate ligament insertion and the tibial spine. The medial collateral  ligament was also protected, and I resected my proximal tibia, matching the anatomic slope.   The proximal tibial bony cut was removed in one piece, and I turned my attention to the femur.  The intramedullary femoral rod was placed using the drill, and then using the appropriate reference, I assembled the femoral jig, setting my posterior cutting block. I resected my posterior femur, and then measured my gap.   I then used the mill to match the extension gap to the flexion gap. The gaps were then measured again with the appropriate feeler gauges. Once I had balanced flexion and extension gaps, I then completed the preparation of the femur.  I milled off the anterior aspect of the distal femur to prevent impingement. I also exposed the tibia, and selected the above-named component, and then used the cutting jig to prepare the keel slot on the tibia. I also used the awl to curette out the bone to complete the preparation of the keel. The back wall was intact.  I then placed trial components, and it was found to have excellent motion, and appropriate balance.  I then cemented the components into place, cementing the tibia first, removing all excess cement, and then cementing the femur.  All loose cement was removed.  The real polyethylene insert was applied manually, and the knee was taken through functional range of motion, and found to have excellent stability and restoration of joint motion, with excellent balance.  The wounds were irrigated copiously, and the parapatellar tissue closed with Vicryl, followed by Vicryl for the subcutaneous tissue, with routine closure with Steri-Strips and sterile gauze.  The tourniquet was released, and the patient was awakened and extubated and returned to PACU  in stable and satisfactory condition. There were no complications.  POSTOPERATIVE PLAN: DVT px will consist of SCD's and chemical px. , WBAT     Renette Butters, MD  This note was generated using  a template and dragon dictation system. In light of that, I have reviewed the note and all aspects of it are applicable to this case. Any dictation errors are due to the computerized dictation system.

## 2014-11-26 NOTE — Discharge Instructions (Signed)

## 2014-11-26 NOTE — Transfer of Care (Signed)
Immediate Anesthesia Transfer of Care Note  Patient: Jessica Good  Procedure(s) Performed: Procedure(s): LEFT UNICOMPARTMENTAL KNEE (Left)  Patient Location: PACU  Anesthesia Type:General  Level of Consciousness: awake, alert , oriented, patient cooperative and responds to stimulation  Airway & Oxygen Therapy: Patient Spontanous Breathing and Patient connected to face mask oxygen  Post-op Assessment: Report given to RN, Post -op Vital signs reviewed and stable and Patient moving all extremities X 4  Post vital signs: Reviewed and stable  Last Vitals:  Filed Vitals:   11/26/14 0828  BP: 133/84  Pulse: 70  Temp: 36.7 C  Resp: 18    Complications: No apparent anesthesia complications

## 2014-11-26 NOTE — Interval H&P Note (Signed)
History and Physical Interval Note:  11/26/2014 10:57 AM  Jessica Good  has presented today for surgery, with the diagnosis of OA LEFT KNEE  The various methods of treatment have been discussed with the patient and family. After consideration of risks, benefits and other options for treatment, the patient has consented to  Procedure(s): LEFT UNICOMPARTMENTAL KNEE (Left) as a surgical intervention .  The patient's history has been reviewed, patient examined, no change in status, stable for surgery.  I have reviewed the patient's chart and labs.  Questions were answered to the patient's satisfaction.     MURPHY, TIMOTHY D

## 2014-11-26 NOTE — Anesthesia Preprocedure Evaluation (Addendum)
Anesthesia Evaluation  Patient identified by MRN, date of birth, ID band Patient awake    Reviewed: Allergy & Precautions, NPO status , Patient's Chart, lab work & pertinent test results  Airway Mallampati: II  TM Distance: <3 FB Neck ROM: Full    Dental  (+) Loose, Poor Dentition,    Pulmonary neg pulmonary ROS,  breath sounds clear to auscultation  Pulmonary exam normal       Cardiovascular hypertension, Pt. on medications Normal cardiovascular examRhythm:Regular Rate:Normal     Neuro/Psych negative neurological ROS  negative psych ROS   GI/Hepatic Neg liver ROS, GERD-  Medicated,  Endo/Other  diabetesMorbid obesity  Renal/GU negative Renal ROS  negative genitourinary   Musculoskeletal negative musculoskeletal ROS (+)   Abdominal   Peds negative pediatric ROS (+)  Hematology negative hematology ROS (+)   Anesthesia Other Findings   Reproductive/Obstetrics negative OB ROS                            Anesthesia Physical Anesthesia Plan  ASA: III  Anesthesia Plan: General   Post-op Pain Management: GA combined w/ Regional for post-op pain   Induction: Intravenous  Airway Management Planned: Oral ETT  Additional Equipment:   Intra-op Plan:   Post-operative Plan: Extubation in OR  Informed Consent: I have reviewed the patients History and Physical, chart, labs and discussed the procedure including the risks, benefits and alternatives for the proposed anesthesia with the patient or authorized representative who has indicated his/her understanding and acceptance.   Dental advisory given  Plan Discussed with: CRNA and Surgeon  Anesthesia Plan Comments:         Anesthesia Quick Evaluation

## 2014-11-26 NOTE — Progress Notes (Signed)
Orthopedic Tech Progress Note Patient Details:  Jessica Good 1951-04-07 [id number]  Patient ID: [name], female   DOB: 1951-04-15, 64 y.o.   MRN: [id number]  Footsie roll Hildred Priest 11/26/2014, 3:09 PM

## 2014-11-26 NOTE — Progress Notes (Signed)
Orthopedic Tech Progress Note Patient Details:  Jessica Good December 05, 1950 [id number]  CPM Left Knee CPM Left Knee: On Left Knee Flexion (Degrees): 90 Left Knee Extension (Degrees): 0 Ortho will provide trapeze bar patient helper when one becomes available  Hildred Priest 11/26/2014, 3:07 PM

## 2014-11-27 ENCOUNTER — Encounter (HOSPITAL_COMMUNITY): Payer: Self-pay | Admitting: General Practice

## 2014-11-27 DIAGNOSIS — M1712 Unilateral primary osteoarthritis, left knee: Secondary | ICD-10-CM | POA: Diagnosis present

## 2014-11-27 LAB — GLUCOSE, CAPILLARY
GLUCOSE-CAPILLARY: 119 mg/dL — AB (ref 65–99)
GLUCOSE-CAPILLARY: 140 mg/dL — AB (ref 65–99)
GLUCOSE-CAPILLARY: 180 mg/dL — AB (ref 65–99)
Glucose-Capillary: 127 mg/dL — ABNORMAL HIGH (ref 65–99)
Glucose-Capillary: 138 mg/dL — ABNORMAL HIGH (ref 65–99)
Glucose-Capillary: 172 mg/dL — ABNORMAL HIGH (ref 65–99)

## 2014-11-27 MED ORDER — PNEUMOCOCCAL VAC POLYVALENT 25 MCG/0.5ML IJ INJ: 0.5000 mL | INJECTION | INTRAMUSCULAR | Status: DC

## 2014-11-27 MED ORDER — PNEUMOCOCCAL VAC POLYVALENT 25 MCG/0.5ML IJ INJ: 0.5000 mL | INJECTION | Freq: Once | INTRAMUSCULAR | Status: DC

## 2014-11-27 NOTE — Progress Notes (Signed)
     Subjective:  POD#1 L Unicompartmental knee arthroplasty. Patient reports pain as moderate.  Resting comfortably in bed.  Was able to get up to the bathroom with nursing throughout the night.   Objective:   VITALS:   Filed Vitals:   11/26/14 1700 11/26/14 2026 11/27/14 0133 11/27/14 0528  BP: 129/57 133/46 113/48 111/55  Pulse: 72 85 100 88  Temp: 98.2 F (36.8 C) 98.8 F (37.1 C) 99.2 F (37.3 C) 98.9 F (37.2 C)  TempSrc:  Oral Oral Oral  Resp: 16 16 16 16   Height:      Weight:      SpO2: 100% 97% 95% 98%    Neurologically intact ABD soft Neurovascular intact Sensation intact distally Intact pulses distally Dorsiflexion/Plantar flexion intact Incision: dressing C/D/I   Lab Results  Component Value Date   WBC 4.8 11/18/2014   HGB 13.3 11/18/2014   HCT 40.8 11/18/2014   MCV 91.9 11/18/2014   PLT 198 11/18/2014   BMET    Component Value Date/Time   NA 142 11/18/2014 0904   K 4.5 11/18/2014 0904   CL 107 11/18/2014 0904   CO2 29 11/18/2014 0904   GLUCOSE 134* 11/18/2014 0904   BUN 12 11/18/2014 0904   CREATININE 0.75 11/18/2014 0904   CALCIUM 9.4 11/18/2014 0904   GFRNONAA >60 11/18/2014 0904   GFRAA >60 11/18/2014 0904     Assessment/Plan: 1 Day Post-Op   Active Problems:   Arthritis of knee   Up with therapy WBAT in the LLE ASA for DVT prophylaxis Plan is to d/c home today as long as pain well controlled and PT/OT goes well.  Would like 2 sessions with PT before discharge.    Tonisha Silvey Lelan Pons 11/27/2014, 7:34 AM Cell [phone]

## 2014-11-27 NOTE — Progress Notes (Signed)
Pt felt she couldn't be discharged today, Dr Percell Miller office notified

## 2014-11-27 NOTE — Progress Notes (Signed)
Physical Therapy Treatment Patient Details Name: Jessica Good MRN: [id number] DOB: 1950-11-24 Today's Date: 11/27/2014    History of Present Illness Progressive Lt knee pain, now s/p Unicompartmental knee arthroplasty    PT Comments    Pt is s/p TKA resulting in the deficits listed below (see PT Problem List). Pt will benefit from skilled PT to increase their independence and safety with mobility to allow discharge to the venue listed below. Patient slow with initial mobilization with max distance of 8 feet. Not at functional mobility level for D/C home at this time.     Follow Up Recommendations  Home health PT     Equipment Recommendations  None recommended by PT    Recommendations for Other Services       Precautions / Restrictions Precautions Precautions: Knee;Fall Required Braces or Orthoses: Knee Immobilizer - Left Knee Immobilizer - Left: Other (comment) (not specified) Restrictions Weight Bearing Restrictions: Yes LLE Weight Bearing: Weight bearing as tolerated    Mobility  Bed Mobility Overal bed mobility: Needs Assistance Bed Mobility: Supine to Sit     Supine to sit: Mod assist     General bed mobility comments: using bed rails   Transfers Overall transfer level: Needs assistance Equipment used: Rolling walker (2 wheeled) Transfers: Sit to/from Stand Sit to Stand: Mod assist            Ambulation/Gait Ambulation/Gait assistance: Min assist Ambulation Distance (Feet): 8 Feet Assistive device: Rolling walker (2 wheeled) Gait Pattern/deviations: Step-to pattern;Decreased step length - left;Decreased weight shift to left   Gait velocity interpretation: Below normal speed for age/gender     Stairs            Wheelchair Mobility    Modified Rankin (Stroke Patients Only)       Balance Overall balance assessment: Needs assistance Sitting-balance support: Bilateral upper extremity supported Sitting balance-Leahy Scale: Poor      Standing balance support: Bilateral upper extremity supported Standing balance-Leahy Scale: Poor                      Cognition Arousal/Alertness: Awake/alert Behavior During Therapy: WFL for tasks assessed/performed Overall Cognitive Status: Within Functional Limits for tasks assessed                      Exercises      General Comments        Pertinent Vitals/Pain Pain Assessment: 0-10 Pain Score: 9  Pain Location: Lt knee Pain Descriptors / Indicators: Aching Pain Intervention(s): Limited activity within patient's tolerance;Monitored during session    Home Living                      Prior Function            PT Goals (current goals can now be found in the care plan section) Acute Rehab PT Goals Patient Stated Goal: Get this over and go home.  PT Goal Formulation: With patient Time For Goal Achievement: 12/11/14 Potential to Achieve Goals: Good Progress towards PT goals: Progressing toward goals    Frequency  7X/week    PT Plan Current plan remains appropriate    Co-evaluation             End of Session Equipment Utilized During Treatment: Gait belt;Left knee immobilizer Activity Tolerance: Patient limited by fatigue;Patient limited by pain Patient left: in chair;with call bell/phone within reach;with family/visitor present     Time: 8250-0370 PT Time  Calculation (min) (ACUTE ONLY): 16 min  Charges:  $Gait Training: 8-22 mins                    G Codes:      Cassell Clement, PT, CSCS Pager [phone] Office [phone]  11/27/2014, 4:36 PM

## 2014-11-27 NOTE — Progress Notes (Signed)
Orthopedic Tech Progress Note Patient Details:  Jessica Good [birth year] [id number] On cpm at 7:00 pm Patient ID: Jessica Good, female   DOB: February 02, 1951, 64 y.o.   MRN: [id number]   [name] 11/27/2014, 7:06 PM

## 2014-11-27 NOTE — Discharge Summary (Signed)
Physician Discharge Summary  Patient ID: Jessica Good MRN: [id number] DOB/AGE: [birth year] 65 y.o.  Admit date: 11/26/2014 Discharge date: 11/27/2014  Admission Diagnoses:  Primary localized osteoarthritis of left knee  Discharge Diagnoses:  Principal Problem:   Primary localized osteoarthritis of left knee Active Problems:   Arthritis of knee   Past Medical History  Diagnosis Date  . Hypertension   . Anxiety   . Osteoarthritis   . GERD (gastroesophageal reflux disease)   . Diabetes mellitus without complication     boarderline  . Constipation due to pain medication   . Urgency of urination   . Edema of lower extremity     Takes lasix PRN  . Hiatal hernia   . IBS (irritable bowel syndrome)     Surgeries: Procedure(s): LEFT UNICOMPARTMENTAL KNEE on 11/26/2014   Consultants (if any):    Discharged Condition: Improved  Hospital Course: Jessica Good is an 64 y.o. female who was admitted 11/26/2014 with a diagnosis of Primary localized osteoarthritis of left knee and went to the operating room on 11/26/2014 and underwent the above named procedures.    She was given perioperative antibiotics:      Anti-infectives    Start     Dose/Rate Route Frequency Ordered Stop   11/26/14 1800  ceFAZolin (ANCEF) IVPB 2 g/50 mL premix     2 g 100 mL/hr over 30 Minutes Intravenous Every 6 hours 11/26/14 1649 11/26/14 2339   11/26/14 1000  ceFAZolin (ANCEF) IVPB 2 g/50 mL premix     2 g 100 mL/hr over 30 Minutes Intravenous To [facility] 11/25/14 1358 11/26/14 1125    .  She was given sequential compression devices, early ambulation, and ASA 325mg  for DVT prophylaxis.  She benefited maximally from the hospital stay and there were no complications.    Recent vital signs:  Filed Vitals:   11/27/14 0528  BP: 111/55  Pulse: 88  Temp: 98.9 F (37.2 C)  Resp: 16    Recent laboratory studies:  Lab Results  Component Value Date   HGB 13.3 11/18/2014   HGB 13.5  11/04/2014   HGB 13.3 04/03/2014   Lab Results  Component Value Date   WBC 4.8 11/18/2014   PLT 198 11/18/2014   Lab Results  Component Value Date   INR 1.10 11/18/2014   Lab Results  Component Value Date   NA 142 11/18/2014   K 4.5 11/18/2014   CL 107 11/18/2014   CO2 29 11/18/2014   BUN 12 11/18/2014   CREATININE 0.75 11/18/2014   GLUCOSE 134* 11/18/2014    Discharge Medications:     Medication List    TAKE these medications        aspirin 325 MG tablet  Take 1 tablet (325 mg total) by mouth daily.     cholecalciferol 1000 UNITS tablet  Commonly known as:  VITAMIN D  Take 1,000 Units by mouth daily.     cyclobenzaprine 10 MG tablet  Commonly known as:  FLEXERIL  Take 1 tablet (10 mg total) by mouth 3 (three) times daily as needed for muscle spasms.     docusate sodium 100 MG capsule  Commonly known as:  COLACE  Take 1 capsule (100 mg total) by mouth 2 (two) times daily.     furosemide 40 MG tablet  Commonly known as:  LASIX  Take 40 mg by mouth daily as needed for fluid.     lisinopril 20 MG tablet  Commonly known as:  PRINIVIL,ZESTRIL  Take 20 mg by mouth daily.     multivitamin with minerals Tabs tablet  Take 1 tablet by mouth daily.     ondansetron 4 MG tablet  Commonly known as:  ZOFRAN  Take 1 tablet (4 mg total) by mouth every 8 (eight) hours as needed for nausea or vomiting.     oxyCODONE-acetaminophen 5-325 MG per tablet  Commonly known as:  PERCOCET  Take 1-2 tablets by mouth every 4 (four) hours as needed for severe pain.     pantoprazole 40 MG tablet  Commonly known as:  PROTONIX  Take 1 tablet (40 mg total) by mouth 2 (two) times daily before a meal.     PROBIOTIC DAILY PO  Take 1 tablet by mouth daily.     sucralfate 1 G tablet  Commonly known as:  CARAFATE  Take 1 tablet (1 g total) by mouth 4 (four) times daily -  with meals and at bedtime.     vitamin C 500 MG tablet  Commonly known as:  ASCORBIC ACID  Take 500 mg by mouth  daily.        Diagnostic Studies: US Abdomen Complete  11/12/2014   CLINICAL DATA:  Left upper quadrant pain for the past year, a calling stools, reflux symptoms for 8 weeks  EXAM: ULTRASOUND ABDOMEN COMPLETE  COMPARISON:  The gallbladder is adequately distended. There are multiple echogenic mobile shadowing stones. The gallbladder wall measures 2.4 mm which remains normal. There is no pericholecystic fluid or positive sonographic Murphy's sign.  FINDINGS: Gallbladder: 4.1 mm.  Common bile duct: Diameter: The liver exhibits normal echotexture with no focal mass nor ductal dilation.  Liver: No focal lesion identified. Within normal limits in parenchymal echogenicity.  IVC: No abnormality visualized.  Pancreas: Visualized portion unremarkable.  Spleen: Size and appearance within normal limits.  Right Kidney: Length: 11.4 cm. Echogenicity within normal limits. No mass or hydronephrosis visualized.  Left Kidney: Length: 10.7 cm. Echogenicity within normal limits. No mass or hydronephrosis visualized.  Abdominal aorta: No aneurysm visualized.  Other findings: None.  IMPRESSION: 1. Gallstones without evidence of acute cholecystitis. 2. No acute abnormality is observed elsewhere within the abdomen.   Electronically Signed   By: David  Martinique M.D.   On: 11/12/2014 09:37   Dg Knee Left Port  11/26/2014   CLINICAL DATA:  Status post medial joint compartment prosthesis placement  EXAM: PORTABLE LEFT KNEE - 1-2 VIEW  COMPARISON:  None.  FINDINGS: The patient has undergone placement of a prosthetic medial femoral condyles and a prosthetic medial tibial plateau. Radiographic positioning of the prosthetic structures is good. The lateral joint space is reasonably well maintained. There is beaking of the tibial spines. There is a small joint effusion and there is air in the anterior soft tissues.  IMPRESSION: The patient has undergone unicompartmental left knee replacement. There is no immediate postprocedure complication.    Electronically Signed   By: David  Martinique M.D.   On: 11/26/2014 14:52    Disposition: 01-Home or Self Care  Discharge Instructions    Weight bearing as tolerated    Complete by:  As directed   Laterality:  left  Extremity:  Lower           Follow-up Information    Follow up with MURPHY, TIMOTHY D, MD In 1 week.   Specialty:  Orthopedic Surgery   Contact information:   [address] [place] [phone]  SignedGae Dry 11/27/2014, 7:37 AM Cell [phone]

## 2014-11-27 NOTE — Care Management Note (Signed)
Case Management Note  Patient Details  Name: ROSSANA MOLCHAN MRN: [id number] Date of Birth: [birth year]  Subjective/Objective:     64 yr old female s/p left unicompartmental knee               Action/Plan:   Case manager spoke with patient concerning home health and DME needs at discharge. Patient was preoperatively setup with [facility], no changes. Patient states she has a rolling walker but needs a wide 3in1. Patient has family support at discharge.   Expected Discharge Date:   11/27/14               Expected Discharge Plan:   Home with Home Health  In-House Referral:     Discharge planning Services     Post Acute Care Choice:    Choice offered to:     DME Arranged:    DME Agency:     HH Arranged:   PT HH Agency:   [place]  Status of Service:     Medicare Important Message Given:    Date Medicare IM Given:    Medicare IM give by:    Date Additional Medicare IM Given:    Additional Medicare Important Message give by:     If discussed at [place] of Stay Meetings, dates discussed:    Additional Comments:  Ninfa Meeker, RN 11/27/2014, 10:21 AM

## 2014-11-27 NOTE — Evaluation (Signed)
Physical Therapy Evaluation Patient Details Name: Jessica Good MRN: [id number] DOB: 01-11-1951 Today's Date: 11/27/2014   History of Present Illness  Progressive Lt knee pain, now s/p Unicompartmental knee arthroplasty  Clinical Impression  Patient mobilizing slowly with initial session. Will follow up in afternoon as well. Patient currently not ready for D/C home.  Pt is s/p TKA resulting in the deficits listed below (see PT Problem List).  Pt will benefit from skilled PT to increase their independence and safety with mobility to allow discharge home as mobility improves.      Follow Up Recommendations Home health PT    Equipment Recommendations  None recommended by PT (patient reports having equipment)    Recommendations for Other Services       Precautions / Restrictions Precautions Precautions: Knee;Fall Precaution Booklet Issued: Yes (comment) Precaution Comments: TKA HEP provided Required Braces or Orthoses: Knee Immobilizer - Left (in room, no order specific to use) Knee Immobilizer - Left:  (not specified) Restrictions Weight Bearing Restrictions: Yes LLE Weight Bearing: Weight bearing as tolerated      Mobility  Bed Mobility Overal bed mobility: Needs Assistance Bed Mobility: Supine to Sit     Supine to sit: Mod assist     General bed mobility comments: using bed rails   Transfers Overall transfer level: Needs assistance Equipment used: Rolling walker (2 wheeled) Transfers: Sit to/from Stand Sit to Stand: Mod assist         General transfer comment: transferred from bed-commode-chair  Ambulation/Gait Ambulation/Gait assistance: Mod assist Ambulation Distance (Feet): 5 Feet Assistive device: Rolling walker (2 wheeled) Gait Pattern/deviations: Step-to pattern;Decreased step length - left;Decreased weight shift to left   Gait velocity interpretation: Below normal speed for age/gender    Stairs            Wheelchair Mobility    Modified  Rankin (Stroke Patients Only)       Balance Overall balance assessment: Needs assistance Sitting-balance support: Single extremity supported Sitting balance-Leahy Scale: Poor     Standing balance support: Bilateral upper extremity supported Standing balance-Leahy Scale: Poor                               Pertinent Vitals/Pain Pain Assessment: 0-10 Pain Score: 10-Worst pain ever Pain Location: Lt knee Pain Descriptors / Indicators: Aching;Stabbing Pain Intervention(s): Monitored during session;Premedicated before session;Repositioned    Home Living Family/patient expects to be discharged to:: Private residence Living Arrangements: Spouse/significant other Available Help at Discharge: Family Type of Home: House Home Access: Stairs to enter Entrance Stairs-Rails: Psychiatric nurse of Steps: 3 Home Layout: Able to live on main level with bedroom/bathroom Home Equipment: Walker - 2 wheels      Prior Function Level of Independence: Independent (not using any devices prior to admit)               Hand Dominance        Extremity/Trunk Assessment               Lower Extremity Assessment: Generalized weakness         Communication   Communication: No difficulties  Cognition Arousal/Alertness: Awake/alert Behavior During Therapy: WFL for tasks assessed/performed Overall Cognitive Status: Within Functional Limits for tasks assessed                      General Comments      Exercises  Assessment/Plan    PT Assessment Patient needs continued PT services  PT Diagnosis Difficulty walking;Generalized weakness   PT Problem List Decreased strength;Decreased range of motion;Decreased activity tolerance;Decreased balance;Decreased mobility;Decreased knowledge of use of DME  PT Treatment Interventions DME instruction;Gait training;Stair training;Functional mobility training;Therapeutic activities;Therapeutic  exercise;Patient/family education   PT Goals (Current goals can be found in the Care Plan section) Acute Rehab PT Goals Patient Stated Goal: Get this over and go home.  PT Goal Formulation: With patient Time For Goal Achievement: 12/11/14 Potential to Achieve Goals: Good    Frequency 7X/week   Barriers to discharge        Co-evaluation               End of Session Equipment Utilized During Treatment: Gait belt;Left knee immobilizer Activity Tolerance: Patient limited by fatigue;No increased pain Patient left: in chair;with call bell/phone within reach;with family/visitor present           Time: 2395-3202 PT Time Calculation (min) (ACUTE ONLY): 33 min   Charges:   PT Evaluation $Initial PT Evaluation Tier I: 1 Procedure PT Treatments $Therapeutic Activity: 8-22 mins   PT G Codes:        Cassell Clement, PT, CSCS Pager [phone] Office [phone]  11/27/2014, 10:29 AM

## 2014-11-27 NOTE — Progress Notes (Signed)
OT Cancellation Note  Patient Details Name: Jessica Good MRN: [id number] DOB: 02-Aug-1950   Cancelled Treatment:    Reason Eval/Treat Not Completed: Pain limiting ability to participate. Attempted to work wit pt again but pt back to bed and stating pain still significant. Will try back likely in am. Notified nursing that if pt progresses later to d/c home to notify dept for OT eval this pm but if pt staying till tomorrow, OT will check in am.  [place], [place] 11/27/2014, 12:42 PM

## 2014-11-27 NOTE — Progress Notes (Signed)
Orthopedic Tech Progress Note Patient Details:  Jessica Good Dec 28, 1950 [id number]  Patient ID: Jessica Good, female   DOB: 1950/09/29, 64 y.o.   MRN: [id number] Placed pt's lle on cpm @ 0-60 degrees @1410   Hildred Priest 11/27/2014, 2:08 PM

## 2014-11-27 NOTE — Progress Notes (Signed)
OT Cancellation Note  Patient Details Name: Jessica Good MRN: [id number] DOB: [birth year]   Cancelled Treatment:    Reason Eval/Treat Not Completed: Pain limiting ability to participate. Pt receiving pain meds currently with nursing and pt requests to hold off right now on OT. Will check back at a later time.  [place], [place] 11/27/2014, 11:01 AM

## 2014-11-28 ENCOUNTER — Encounter (HOSPITAL_COMMUNITY): Payer: Self-pay | Admitting: Orthopedic Surgery

## 2014-11-28 LAB — GLUCOSE, CAPILLARY
GLUCOSE-CAPILLARY: 112 mg/dL — AB (ref 65–99)
Glucose-Capillary: 111 mg/dL — ABNORMAL HIGH (ref 65–99)

## 2014-11-28 NOTE — Progress Notes (Signed)
Orthopedic Tech Progress Note Patient Details:  Jessica Good February 02, 1951 [id number]  Ortho Devices Ortho Device/Splint Interventions: Application   Cammer, Theodoro Parma 11/28/2014, 9:14 AM

## 2014-11-28 NOTE — Progress Notes (Signed)
Physical Therapy Treatment Patient Details Name: Jessica Good MRN: [id number] DOB: 12-24-1950 Today's Date: 11/28/2014    History of Present Illness Progressive Lt knee pain, now s/p Unicompartmental knee arthroplasty    PT Comments    Patient is making good progress with PT.  From a mobility standpoint anticipate patient will be ready for DC home.      Follow Up Recommendations  Home health PT     Equipment Recommendations  None recommended by PT    Recommendations for Other Services       Precautions / Restrictions Precautions Precautions: Knee;Fall Precaution Comments: TKA HEP provided Required Braces or Orthoses:  (none) Knee Immobilizer - Left:  (none) Restrictions Weight Bearing Restrictions: Yes LLE Weight Bearing: Weight bearing as tolerated    Mobility  Bed Mobility                  Transfers Overall transfer level: Needs assistance Equipment used: Rolling walker (2 wheeled) Transfers: Sit to/from Stand Sit to Stand: Min assist         General transfer comment: total of 5 transfers during session including toilet  Ambulation/Gait Ambulation/Gait assistance: Min guard Ambulation Distance (Feet): 30 Feet Assistive device: Rolling walker (2 wheeled) Gait Pattern/deviations: Step-to pattern   Gait velocity interpretation: Below normal speed for age/gender     Stairs Stairs: Yes Stairs assistance: Min assist Stair Management: One rail Right;Step to pattern Number of Stairs: 2 General stair comments: patient and husband report feeling confident with stairs at this time.   Wheelchair Mobility    Modified Rankin (Stroke Patients Only)       Balance Overall balance assessment: Needs assistance Sitting-balance support: No upper extremity supported Sitting balance-Leahy Scale: Fair     Standing balance support: Single extremity supported Standing balance-Leahy Scale: Poor                      Cognition  Arousal/Alertness: Awake/alert Behavior During Therapy: WFL for tasks assessed/performed Overall Cognitive Status: Within Functional Limits for tasks assessed                      Exercises      General Comments        Pertinent Vitals/Pain Pain Assessment: 0-10 Pain Score: 5  Pain Location: Lt knee Pain Descriptors / Indicators: Aching Pain Intervention(s): Monitored during session    Home Living                      Prior Function            PT Goals (current goals can now be found in the care plan section) Acute Rehab PT Goals Patient Stated Goal: Get this over and go home.  PT Goal Formulation: With patient Time For Goal Achievement: 12/11/14 Potential to Achieve Goals: Good Progress towards PT goals: Progressing toward goals    Frequency  7X/week    PT Plan Current plan remains appropriate    Co-evaluation             End of Session Equipment Utilized During Treatment: Gait belt Activity Tolerance: Patient tolerated treatment well Patient left: in chair;with call bell/phone within reach;with family/visitor present     Time: 2376-2831 PT Time Calculation (min) (ACUTE ONLY): 42 min  Charges:  $Gait Training: 23-37 mins $Therapeutic Activity: 8-22 mins                    G Codes:  Cassell Clement, PT, CSCS Pager [phone] Office [phone]  11/28/2014, 9:55 AM

## 2014-11-28 NOTE — Progress Notes (Signed)
Patient did much better with PT this morning.  Was able to walk in the hall and do some stairs.  Will have work with PT once more today and then plan for discharge afterwards.

## 2014-11-28 NOTE — Evaluation (Signed)
Occupational Therapy Evaluation Patient Details Name: Jessica Good MRN: [id number] DOB: 05-10-1951 Today's Date: 11/28/2014    History of Present Illness Progressive Lt knee pain, now s/p Unicompartmental knee arthroplasty   Clinical Impression   Pt doing well and she states she is familiar with ADL techniques from previous knee surgery. Husband able to assist. Feel she is ok to d/c from OT standpoint today. She plans to sponge bathe initially.     Follow Up Recommendations  No OT follow up;Supervision/Assistance - 24 hour    Equipment Recommendations  3 in 1 bedside comode (wide)    Recommendations for Other Services       Precautions / Restrictions Precautions Precautions: Knee;Fall Precaution Comments: TKA HEP provided Required Braces or Orthoses:  (none) Knee Immobilizer - Left:  (none) Restrictions Weight Bearing Restrictions: Yes LLE Weight Bearing: Weight bearing as tolerated      Mobility Bed Mobility               General bed mobility comments: in chair.   Transfers Overall transfer level: Needs assistance Equipment used: Rolling walker (2 wheeled) Transfers: Sit to/from Stand Sit to Stand: Min assist         General transfer comment: cues for hand placement    Balance Overall balance assessment: Needs assistance Sitting-balance support: No upper extremity supported Sitting balance-Leahy Scale: Fair                                   ADL Overall ADL's : Needs assistance/impaired Eating/Feeding: Independent;Sitting   Grooming: Wash/dry hands;Set up;Sitting   Upper Body Bathing: Set up;Sitting   Lower Body Bathing: Moderate assistance;Sit to/from stand   Upper Body Dressing : Set up;Sitting   Lower Body Dressing: Moderate assistance;Sit to/from stand   Toilet Transfer: Minimal assistance;Ambulation;BSC;RW   Toileting- Clothing Manipulation and Hygiene: Minimal assistance;Sit to/from stand         General ADL  Comments: Pt plans to sponge bathe initially and recommended to let [facility] assess tub transfer once she feels ready to try shower. She has a shower chair. She is requesting a wide 3in1 and discussed how to adjust for proper height. She has all AE for LB self care and states she feels comfortable with how to use all AE. Reviewed sequence for LB dressing and to don clothing seated and then stand with walker already in front of her and pull up clothing.      Vision     Perception     Praxis      Pertinent Vitals/Pain Pain Assessment: 0-10 Pain Score: 5  Pain Location: L knee Pain Descriptors / Indicators: Aching Pain Intervention(s): Repositioned;Ice applied     Hand Dominance     Extremity/Trunk Assessment Upper Extremity Assessment Upper Extremity Assessment: Overall WFL for tasks assessed           Communication Communication Communication: No difficulties   Cognition Arousal/Alertness: Awake/alert Behavior During Therapy: WFL for tasks assessed/performed Overall Cognitive Status: Within Functional Limits for tasks assessed                     General Comments       Exercises       Shoulder Instructions      Home Living Family/patient expects to be discharged to:: Private residence Living Arrangements: Spouse/significant other Available Help at Discharge: Family Type of Home: House Home Access: Stairs to enter Entrance  Stairs-Number of Steps: 3 Entrance Stairs-Rails: Right;Left Home Layout: Able to live on main level with bedroom/bathroom     Bathroom Shower/Tub: Tub/shower unit   Bathroom Toilet: Handicapped height     Home Equipment: Environmental consultant - 2 wheels;Adaptive equipment Adaptive Equipment: Reacher;Sock aid;Long-handled shoe horn;Long-handled sponge        Prior Functioning/Environment Level of Independence: Independent (not using any devices prior to admit)             OT Diagnosis: Generalized weakness   OT Problem List: Decreased  strength;Decreased knowledge of use of DME or AE   OT Treatment/Interventions: Self-care/ADL training;Patient/family education;Therapeutic activities;DME and/or AE instruction    OT Goals(Current goals can be found in the care plan section) Acute Rehab OT Goals Patient Stated Goal: to return to more independence.  OT Goal Formulation: With patient Time For Goal Achievement: 12/05/14 Potential to Achieve Goals: Good  OT Frequency: Min 2X/week   Barriers to D/C:            Co-evaluation              End of Session Equipment Utilized During Treatment: Rolling walker CPM Left Knee Additional Comments: Replacement knee immobilizer  Activity Tolerance: Patient tolerated treatment well Patient left: in chair;with call bell/phone within reach;with family/visitor present   Time: 1010-1030 OT Time Calculation (min): 20 min Charges:  OT General Charges $OT Visit: 1 Procedure OT Evaluation $Initial OT Evaluation Tier I: 1 Procedure G-Codes:    Jules Schick  [phone] 11/28/2014, 11:26 AM

## 2014-11-28 NOTE — Progress Notes (Signed)
Physical Therapy Treatment Patient Details Name: Jessica Good MRN: [id number] DOB: 08-Apr-1951 Today's Date: 11/28/2014    History of Present Illness Progressive Lt knee pain, now s/p Unicompartmental knee arthroplasty    PT Comments    Patient is making good progress with PT.  From a mobility standpoint anticipate patient will be ready for DC home. Patient and spouse deny questions or concerns.   Follow Up Recommendations  Home health PT     Equipment Recommendations  None recommended by PT    Recommendations for Other Services       Precautions / Restrictions Precautions Precautions: Knee Restrictions Weight Bearing Restrictions: Yes LLE Weight Bearing: Weight bearing as tolerated    Mobility  Bed Mobility Overal bed mobility: Needs Assistance Bed Mobility: Supine to Sit;Sit to Supine     Supine to sit: Min assist Sit to supine: Min assist   General bed mobility comments: min assist with LLE needed.   Transfers Overall transfer level: Needs assistance Equipment used: Rolling walker (2 wheeled) Transfers: Sit to/from Stand Sit to Stand: Min guard         General transfer comment: cues for hand placement  Ambulation/Gait Ambulation/Gait assistance: Min guard Ambulation Distance (Feet): 30 Feet Assistive device: Rolling walker (2 wheeled) Gait Pattern/deviations: Step-to pattern   Gait velocity interpretation: Below normal speed for age/gender     Stairs            Wheelchair Mobility    Modified Rankin (Stroke Patients Only)       Balance Overall balance assessment: Needs assistance Sitting-balance support: No upper extremity supported Sitting balance-Leahy Scale: Fair     Standing balance support: Single extremity supported Standing balance-Leahy Scale: Poor                      Cognition Arousal/Alertness: Awake/alert Behavior During Therapy: WFL for tasks assessed/performed Overall Cognitive Status: Within Functional  Limits for tasks assessed                      Exercises Total Joint Exercises Ankle Circles/Pumps: AROM;Both;10 reps;Supine Quad Sets: Strengthening;Left;10 reps;Supine Towel Squeeze: Strengthening;Both;5 reps;Supine Short Arc Quad: Strengthening;Right;5 reps;Supine Heel Slides: AAROM;Left;10 reps;Supine Hip ABduction/ADduction: Strengthening;Left;10 reps;Supine Straight Leg Raises: Strengthening;Left;5 reps;Supine Goniometric ROM: 64 degrees flexion    General Comments        Pertinent Vitals/Pain Pain Assessment: 0-10 Pain Score: 8  Pain Location: Lt knee Pain Descriptors / Indicators: Aching Pain Intervention(s): Monitored during session;Premedicated before session    Home Living Family/patient expects to be discharged to:: Private residence Living Arrangements: Spouse/significant other Available Help at Discharge: Family Type of Home: House Home Access: Stairs to enter Entrance Stairs-Rails: Right;Left Home Layout: Able to live on main level with bedroom/bathroom Home Equipment: Environmental consultant - 2 wheels;Adaptive equipment      Prior Function Level of Independence: Independent (not using any devices prior to admit)          PT Goals (current goals can now be found in the care plan section) Acute Rehab PT Goals Patient Stated Goal: to return to more independence.  PT Goal Formulation: With patient Time For Goal Achievement: 12/11/14 Potential to Achieve Goals: Good Progress towards PT goals: Progressing toward goals    Frequency  7X/week    PT Plan Current plan remains appropriate    Co-evaluation             End of Session Equipment Utilized During Treatment: Gait belt Activity Tolerance: Patient  tolerated treatment well Patient left: in bed;with call bell/phone within reach;with family/visitor present     Time: 1610-9604 PT Time Calculation (min) (ACUTE ONLY): 28 min  Charges:  $Gait Training: 8-22 mins $Therapeutic Exercise: 8-22  mins                    G Codes:      Cassell Clement, PT, CSCS Pager [phone] Office [phone]  11/28/2014, 2:47 PM

## 2014-12-09 ENCOUNTER — Encounter: Payer: BLUE CROSS/BLUE SHIELD | Admitting: Internal Medicine

## 2014-12-26 ENCOUNTER — Telehealth: Payer: Self-pay | Admitting: Internal Medicine

## 2014-12-26 NOTE — Telephone Encounter (Signed)
Pt states she thinks the carafate is causing constipation. Pt is also taking pain medication. Discussed with pt that pain medication may cause constipation and that she could try miralax for constipation.

## 2015-01-08 ENCOUNTER — Ambulatory Visit (AMBULATORY_SURGERY_CENTER): Payer: BLUE CROSS/BLUE SHIELD | Admitting: Internal Medicine

## 2015-01-08 ENCOUNTER — Encounter: Payer: Self-pay | Admitting: Internal Medicine

## 2015-01-08 VITALS — BP 142/79 | HR 71 | Temp 98.0°F | Resp 12 | Ht 61.5 in | Wt 247.0 lb

## 2015-01-08 DIAGNOSIS — R1012 Left upper quadrant pain: Secondary | ICD-10-CM

## 2015-01-08 DIAGNOSIS — K2970 Gastritis, unspecified, without bleeding: Secondary | ICD-10-CM | POA: Diagnosis not present

## 2015-01-08 DIAGNOSIS — K2990 Gastroduodenitis, unspecified, without bleeding: Secondary | ICD-10-CM

## 2015-01-08 DIAGNOSIS — K2950 Unspecified chronic gastritis without bleeding: Secondary | ICD-10-CM | POA: Diagnosis not present

## 2015-01-08 DIAGNOSIS — K219 Gastro-esophageal reflux disease without esophagitis: Secondary | ICD-10-CM

## 2015-01-08 MED ORDER — SODIUM CHLORIDE 0.9 % IV SOLN: 500.0000 mL | INTRAVENOUS | Status: DC

## 2015-01-08 NOTE — Progress Notes (Signed)
Called to room to assist during endoscopic procedure.  Patient ID and intended procedure confirmed with present staff. Received instructions for my participation in the procedure from the performing physician.  

## 2015-01-08 NOTE — Progress Notes (Signed)
Report to PACU, RN, vss, BBS= Clear.  

## 2015-01-08 NOTE — Op Note (Signed)
[place]  [employer]. [place], [id number]   ENDOSCOPY PROCEDURE REPORT  PATIENT: Jessica Good, Jessica Good  MR#: [id number] BIRTHDATE: 1950-11-05 , 57  yrs. old GENDER: female ENDOSCOPIST: Jerene Bears, MD PROCEDURE DATE:  01/08/2015 PROCEDURE:  EGD, diagnostic and EGD w/ biopsy ASA CLASS:     Class III INDICATIONS:  heartburn and abdominal pain in upper left quadrant. MEDICATIONS: Monitored anesthesia care and Propofol 200 mg IV TOPICAL ANESTHETIC: none  DESCRIPTION OF PROCEDURE: After the risks benefits and alternatives of the procedure were thoroughly explained, informed consent was obtained.  The LB [id number] endoscope was introduced through the mouth and advanced to the second portion of the duodenum , Without limitations.  The instrument was slowly withdrawn as the mucosa was fully examined.   ESOPHAGUS: The mucosa of the esophagus appeared normal.   Z-line regular at 36 cm.  STOMACH: Mild erosive gastritis (inflammation) was found in the gastric antrum.  Cold forcep biopsies were taken at the gastric body, antrum and angularis to evaluate for h.  pylori.   A 8 mm subepithelial lesion was located on the greater curvature of the gastric antrum.  Multiple biopsies in bite-on-bite fashion were performed using cold forceps.  DUODENUM: The duodenal mucosa showed no abnormalities in the bulb and 2nd part of the duodenum.  Retroflexed views revealed a small hiatal hernia.     The scope was then withdrawn from the patient and the procedure completed.  COMPLICATIONS: There were no immediate complications.  ENDOSCOPIC IMPRESSION: 1.   The mucosa of the esophagus appeared normal 2.   Erosive gastritis (inflammation) was found in the gastric antrum; multiple biopsies 3.   A 8 mm subepithelial lesion was located on the greater curvature of the gastric antrum; multiple biopsies were performed 4.   The duodenal mucosa showed no abnormalities in the bulb and  2nd part of the duodenum  RECOMMENDATIONS: 1.  Await biopsy results 2.  Continue current medications, can make sucralfate tablet into slurry if easier to swallow 3.  Office follow-up for continuity after procedure   eSigned:  Jerene Bears, MD 01/08/2015 9:59 AM    CC: the patient, Everardo Beals, MD  PATIENT NAME:  Jessica Good, Jessica Good MR#: [id number]

## 2015-01-08 NOTE — Patient Instructions (Signed)
Gastritis seen today. Biopsies taken today.  Handout given today on gastritis.  Office follow up with Dr.Pyrtle.   YOU HAD AN ENDOSCOPIC PROCEDURE TODAY AT [facility]:   Refer to the procedure report that was given to you for any specific questions about what was found during the examination.  If the procedure report does not answer your questions, please call your gastroenterologist to clarify.  If you requested that your care partner not be given the details of your procedure findings, then the procedure report has been included in a sealed envelope for you to review at your convenience later.  YOU SHOULD EXPECT: Some feelings of bloating in the abdomen. Passage of more gas than usual.  Walking can help get rid of the air that was put into your GI tract during the procedure and reduce the bloating. If you had a lower endoscopy (such as a colonoscopy or flexible sigmoidoscopy) you may notice spotting of blood in your stool or on the toilet paper. If you underwent a bowel prep for your procedure, you may not have a normal bowel movement for a few days.  Please Note:  You might notice some irritation and congestion in your nose or some drainage.  This is from the oxygen used during your procedure.  There is no need for concern and it should clear up in a day or so.  SYMPTOMS TO REPORT IMMEDIATELY:   Following upper endoscopy (EGD)  Vomiting of blood or coffee ground material  New chest pain or pain under the shoulder blades  Painful or persistently difficult swallowing  New shortness of breath  Fever of 100F or higher  Black, tarry-looking stools  For urgent or emergent issues, a gastroenterologist can be reached at any hour by calling [phone].   DIET: Your first meal following the procedure should be a small meal and then it is ok to progress to your normal diet. Heavy or fried foods are harder to digest and may make you feel nauseous or bloated.  Likewise, meals  heavy in dairy and vegetables can increase bloating.  Drink plenty of fluids but you should avoid alcoholic beverages for 24 hours.  ACTIVITY:  You should plan to take it easy for the rest of today and you should NOT DRIVE or use heavy machinery until tomorrow (because of the sedation medicines used during the test).    FOLLOW UP: Our staff will call the number listed on your records the next business day following your procedure to check on you and address any questions or concerns that you may have regarding the information given to you following your procedure. If we do not reach you, we will leave a message.  However, if you are feeling well and you are not experiencing any problems, there is no need to return our call.  We will assume that you have returned to your regular daily activities without incident.  If any biopsies were taken you will be contacted by phone or by letter within the next 1-3 weeks.  Please call us at [phone] if you have not heard about the biopsies in 3 weeks.    SIGNATURES/CONFIDENTIALITY: You and/or your care partner have signed paperwork which will be entered into your electronic medical record.  These signatures attest to the fact that that the information above on your After Visit Summary has been reviewed and is understood.  Full responsibility of the confidentiality of this discharge information lies with you and/or your care-partner.

## 2015-01-09 ENCOUNTER — Telehealth: Payer: Self-pay

## 2015-01-09 NOTE — Telephone Encounter (Signed)
  Follow up Call-  Call back number 01/08/2015 03/29/2014  Post procedure Call Back phone  # [phone]  Permission to leave phone message Yes Yes     Patient questions:  Do you have a fever, pain , or abdominal swelling? No. Pain Score  0 *  Have you tolerated food without any problems? Yes.    Have you been able to return to your normal activities? Yes.    Do you have any questions about your discharge instructions: Diet   No. Medications  No. Follow up visit  No.  Do you have questions or concerns about your Care? No.  Actions: * If pain score is 4 or above: No action needed, pain <4.

## 2015-01-20 ENCOUNTER — Telehealth: Payer: Self-pay | Admitting: Internal Medicine

## 2015-01-21 ENCOUNTER — Encounter: Payer: Self-pay | Admitting: Internal Medicine

## 2015-01-21 NOTE — Telephone Encounter (Signed)
Pathology reviewed with pt. Pt scheduled to see Dr. Hilarie Fredrickson 03/20/15@3pm . Pt aware of appt.

## 2015-01-23 ENCOUNTER — Other Ambulatory Visit: Payer: Self-pay

## 2015-01-23 DIAGNOSIS — K319 Disease of stomach and duodenum, unspecified: Secondary | ICD-10-CM

## 2015-01-24 ENCOUNTER — Encounter (HOSPITAL_COMMUNITY): Payer: Self-pay | Admitting: *Deleted

## 2015-02-06 ENCOUNTER — Ambulatory Visit (HOSPITAL_COMMUNITY): Payer: Medicare Other | Admitting: Anesthesiology

## 2015-02-06 ENCOUNTER — Encounter (HOSPITAL_COMMUNITY): Payer: Self-pay

## 2015-02-06 ENCOUNTER — Ambulatory Visit (HOSPITAL_COMMUNITY)
Admission: RE | Admit: 2015-02-06 | Discharge: 2015-02-06 | Disposition: A | Discharge status: Home / Self Care | Payer: Medicare Other | Source: Ambulatory Visit | Attending: Gastroenterology | Admitting: Gastroenterology

## 2015-02-06 ENCOUNTER — Encounter (HOSPITAL_COMMUNITY)
Admission: RE | Disposition: A | Discharge status: Home / Self Care | Payer: Self-pay | Source: Ambulatory Visit | Attending: Gastroenterology

## 2015-02-06 DIAGNOSIS — K219 Gastro-esophageal reflux disease without esophagitis: Secondary | ICD-10-CM | POA: Insufficient documentation

## 2015-02-06 DIAGNOSIS — E119 Type 2 diabetes mellitus without complications: Secondary | ICD-10-CM | POA: Diagnosis not present

## 2015-02-06 DIAGNOSIS — M1990 Unspecified osteoarthritis, unspecified site: Secondary | ICD-10-CM | POA: Insufficient documentation

## 2015-02-06 DIAGNOSIS — I10 Essential (primary) hypertension: Secondary | ICD-10-CM | POA: Diagnosis not present

## 2015-02-06 DIAGNOSIS — Z96651 Presence of right artificial knee joint: Secondary | ICD-10-CM | POA: Insufficient documentation

## 2015-02-06 DIAGNOSIS — K319 Disease of stomach and duodenum, unspecified: Secondary | ICD-10-CM | POA: Diagnosis present

## 2015-02-06 DIAGNOSIS — R1906 Epigastric swelling, mass or lump: Secondary | ICD-10-CM | POA: Diagnosis not present

## 2015-02-06 DIAGNOSIS — G4730 Sleep apnea, unspecified: Secondary | ICD-10-CM | POA: Insufficient documentation

## 2015-02-06 DIAGNOSIS — Z6841 Body Mass Index (BMI) 40.0 and over, adult: Secondary | ICD-10-CM | POA: Insufficient documentation

## 2015-02-06 DIAGNOSIS — E785 Hyperlipidemia, unspecified: Secondary | ICD-10-CM | POA: Diagnosis not present

## 2015-02-06 DIAGNOSIS — K449 Diaphragmatic hernia without obstruction or gangrene: Secondary | ICD-10-CM | POA: Diagnosis not present

## 2015-02-06 DIAGNOSIS — K589 Irritable bowel syndrome without diarrhea: Secondary | ICD-10-CM | POA: Diagnosis not present

## 2015-02-06 HISTORY — PX: EUS: SHX5427

## 2015-02-06 SURGERY — UPPER ENDOSCOPIC ULTRASOUND (EUS) LINEAR
Anesthesia: Monitor Anesthesia Care

## 2015-02-06 MED ORDER — PROPOFOL 500 MG/50ML IV EMUL
INTRAVENOUS | Status: DC | PRN
  Administered 2015-02-06: 75 ug/kg/min via INTRAVENOUS

## 2015-02-06 MED ORDER — PROPOFOL 10 MG/ML IV BOLUS
INTRAVENOUS | Status: AC
  Filled 2015-02-06: qty 20

## 2015-02-06 MED ORDER — LACTATED RINGERS IV SOLN
INTRAVENOUS | Status: DC | PRN
  Administered 2015-02-06: 09:00:00 via INTRAVENOUS

## 2015-02-06 MED ORDER — LABETALOL HCL 5 MG/ML IV SOLN
INTRAVENOUS | Status: DC | PRN
Start: 2015-02-06 — End: 2015-02-06
  Administered 2015-02-06: 2.5 mg via INTRAVENOUS

## 2015-02-06 MED ORDER — LABETALOL HCL 5 MG/ML IV SOLN
INTRAVENOUS | Status: AC
  Filled 2015-02-06: qty 4

## 2015-02-06 NOTE — Anesthesia Postprocedure Evaluation (Signed)
  Anesthesia Post-op Note  Patient: Jessica Good  Procedure(s) Performed: Procedure(s) (LRB): UPPER ENDOSCOPIC ULTRASOUND (EUS) LINEAR (N/A)  Patient Location: PACU  Anesthesia Type: MAC  Level of Consciousness: awake and alert   Airway and Oxygen Therapy: Patient Spontanous Breathing  Post-op Pain: mild  Post-op Assessment: Post-op Vital signs reviewed, Patient's Cardiovascular Status Stable, Respiratory Function Stable, Patent Airway and No signs of Nausea or vomiting  Last Vitals:  Filed Vitals:   02/06/15 1011  BP:   Pulse:   Temp:   Resp: 16    Post-op Vital Signs: stable   Complications: No apparent anesthesia complications

## 2015-02-06 NOTE — Transfer of Care (Signed)
Immediate Anesthesia Transfer of Care Note  Patient: Jessica Good  Procedure(s) Performed: Procedure(s): UPPER ENDOSCOPIC ULTRASOUND (EUS) LINEAR (N/A)  Patient Location: PACU  Anesthesia Type:MAC  Level of Consciousness:  sedated, patient cooperative and responds to stimulation  Airway & Oxygen Therapy:Patient Spontanous Breathing and Patient connected to face mask oxgen  Post-op Assessment:  Report given to PACU RN and Post -op Vital signs reviewed and stable  Post vital signs:  Reviewed and stable  Last Vitals:  Filed Vitals:   02/06/15 0914  BP: 171/100  Pulse: 75  Temp: 36.5 C  Resp: 16    Complications: No apparent anesthesia complications

## 2015-02-06 NOTE — H&P (Signed)
HPI: This is a woman found to have small gastric submucosal lesion on EGD Dr. Hilarie Fredrickson recently.  Tunnel biopsied non-diagnostic  Chief complaint is gastric lesion   Past Medical History  Diagnosis Date  . Hypertension   . Anxiety   . Osteoarthritis   . Constipation due to pain medication   . Urgency of urination   . Edema of lower extremity     Takes lasix PRN  . Hiatal hernia   . IBS (irritable bowel syndrome)   . Cataract   . Hyperlipidemia   . Osteoporosis     osteoarthritis  . Sleep apnea     never dx  . Diabetes mellitus without complication     boarderline no meds  . GERD (gastroesophageal reflux disease)     with meds    Past Surgical History  Procedure Laterality Date  . Ankle surgery Right   . Colonoscopy w/ polypectomy    . 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 Total knee arthroplasty Right 04/09/2014    Procedure: TOTAL KNEE ARTHROPLASTY;  Surgeon: Renette Butters, MD;  Location: [facility];  Service: Orthopedics;  Laterality: Right;  . Partial knee arthroplasty Left 11/26/2014    Procedure: LEFT UNICOMPARTMENTAL KNEE;  Surgeon: Renette Butters, MD;  Location: [facility];  Service: Orthopedics;  Laterality: Left;  . Breast lumpectomy  1996  . Joint replacement    . Eye surgery      lasic 14 yrs ago    No current facility-administered medications for this encounter.    Allergies as of 01/23/2015 - Review Complete 01/08/2015  Allergen Reaction Noted  . Sulfa antibiotics  10/12/2012  . Other Rash 04/03/2014    Family History  Problem Relation Age of Onset  . Diabetes Maternal Grandmother   . Breast cancer Neg Hx   . Celiac disease Neg Hx   . Cirrhosis Neg Hx   . Clotting disorder Neg Hx   . Colitis Neg Hx   . Colon cancer Neg Hx   . Colon polyps Neg Hx   . Crohn's disease Neg Hx   . Cystic fibrosis Neg Hx   . Esophageal cancer Neg Hx   . Hemochromatosis Neg Hx   . Inflammatory bowel disease Neg Hx   . Irritable bowel syndrome Neg  Hx   . Kidney disease Neg Hx   . Liver cancer Neg Hx   . Liver disease Neg Hx   . Ovarian cancer Neg Hx   . Pancreatic cancer Neg Hx   . Prostate cancer Neg Hx   . Rectal cancer Neg Hx   . Stomach cancer Neg Hx   . Ulcerative colitis Neg Hx   . Uterine cancer Neg Hx   . Wilson's disease Neg Hx   . Hypertension Neg Hx   . Diabetes Father   . Heart disease Father     Social History   Social History  . Marital Status: Married    Spouse Name: N/A  . Number of Children: N/A  . Years of Education: N/A   Occupational History  . Not on file.   Social History Main Topics  . Smoking status: Never Smoker   . Smokeless tobacco: Never Used  . Alcohol Use: No  . Drug Use: No  . Sexual Activity: Not on file   Other Topics Concern  . Not on file   Social History Narrative     Physical Exam: There were no vitals taken for this  visit. Constitutional: generally well-appearing Psychiatric: alert and oriented x3 Abdomen: soft, nontender, nondistended, no obvious ascites, no peritoneal signs, normal bowel sounds   Assessment and plan: 64 y.o. female with gastric lesion  For upper EUS today   Owens Loffler, MD [facility] 02/06/2015, 9:04 AM

## 2015-02-06 NOTE — Op Note (Signed)
[facility] [place], [address]   ENDOSCOPIC ULTRASOUND PROCEDURE REPORT  PATIENT: Jessica, Good  MR#: [id number] BIRTHDATE: [birth year]  GENDER: female ENDOSCOPIST: Milus Banister, MD REFERRED BY:  Zenovia Jarred, MD PROCEDURE DATE:  02/06/2015 PROCEDURE:   Upper EUS ASA CLASS:      Class II INDICATIONS:   1.  incidental subepithelial lesion in the stomach. MEDICATIONS: Monitored anesthesia care  DESCRIPTION OF PROCEDURE:   After the risks benefits and alternatives of the procedure were  explained, informed consent was obtained. The patient was then placed in the left, lateral, decubitus postion and IV sedation was administered. Throughout the procedure, the patients blood pressure, pulse and oxygen saturations were monitored continuously.  Under direct visualization, the Pentax EUS Radial [id number]  endoscope was introduced through the mouth  and advanced to the second portion of the duodenum .  Water was used as necessary to provide an acoustic interface.  Upon completion of the imaging, water was removed and the patient was sent to the recovery room in satisfactory condition.  Endoscopic findings: 1. Small hiatal hernia 2. Distal gastric subepithelial lesion was noted, estimated 1cm across. 3. Otherwise normal examination  EUS findings: 1. The subepthelial lesion above corresponded with a small iso to hypoechoic vaguely bordered mass confined to the mucosa, deep mucosa, sub-mucosa layers. The lesion does not communicate with the muscularis propria layer of the gastric wall. The lesion measured 5.36mm maximally. At this small site FNA is rarely diagnostic and so elected not to sample it.  ENDOSCOPIC IMPRESSION: 5.75mm iso to hypoechoic subepithelial lesion in distal stomach. This does not communicate with the muscularis propria and so is very unlikely to be a GIST lesion.  Ddx includes reactive, small carcinoid, perhaps atypical apppearing  lipoma.  I recommend repeat upper EUS evaluation in 12 months to check for interval change, growth.  It is much too small to be causing any GI symptoms.    _______________________________ eSignedMilus Banister, MD 02/06/2015 9:53 AM

## 2015-02-06 NOTE — Discharge Instructions (Signed)

## 2015-02-06 NOTE — Anesthesia Preprocedure Evaluation (Signed)
Anesthesia Evaluation  Patient identified by MRN, date of birth, ID band Patient awake    Reviewed: Allergy & Precautions, NPO status , Patient's Chart, lab work & pertinent test results  Airway Mallampati: II  TM Distance: <3 FB Neck ROM: Full    Dental  (+) Loose, Poor Dentition,    Pulmonary neg pulmonary ROS, sleep apnea ,    Pulmonary exam normal breath sounds clear to auscultation       Cardiovascular hypertension, Pt. on medications (-) angina(-) Past MI Normal cardiovascular exam Rhythm:Regular Rate:Normal     Neuro/Psych PSYCHIATRIC DISORDERS Anxiety negative neurological ROS  negative psych ROS   GI/Hepatic negative GI ROS, Neg liver ROS, hiatal hernia, GERD  Medicated,  Endo/Other  diabetesMorbid obesity  Renal/GU negative Renal ROS  negative genitourinary   Musculoskeletal  (+) Arthritis , Osteoarthritis,    Abdominal   Peds negative pediatric ROS (+)  Hematology negative hematology ROS (+)   Anesthesia Other Findings Day of surgery medications reviewed with the patient.  Reproductive/Obstetrics negative OB ROS                             Anesthesia Physical Anesthesia Plan  ASA: III  Anesthesia Plan: MAC   Post-op Pain Management:    Induction: Intravenous  Airway Management Planned: Nasal Cannula  Additional Equipment:   Intra-op Plan:   Post-operative Plan:   Informed Consent: I have reviewed the patients History and Physical, chart, labs and discussed the procedure including the risks, benefits and alternatives for the proposed anesthesia with the patient or authorized representative who has indicated his/her understanding and acceptance.   Dental advisory given  Plan Discussed with: CRNA and Anesthesiologist  Anesthesia Plan Comments: (Discussed risks/benefits/alternatives to MAC sedation including need for ventilatory support, hypotension, need for  conversion to general anesthesia.  All patient questions answered.  Patient wished to proceed.)        Anesthesia Quick Evaluation

## 2015-02-27 ENCOUNTER — Encounter: Payer: Self-pay | Admitting: *Deleted

## 2015-03-07 ENCOUNTER — Ambulatory Visit: Payer: BLUE CROSS/BLUE SHIELD | Admitting: Internal Medicine

## 2015-03-20 ENCOUNTER — Ambulatory Visit (INDEPENDENT_AMBULATORY_CARE_PROVIDER_SITE_OTHER): Payer: Medicare Other | Admitting: Internal Medicine

## 2015-03-20 ENCOUNTER — Encounter: Payer: Self-pay | Admitting: Internal Medicine

## 2015-03-20 VITALS — BP 118/82 | HR 76 | Ht 61.5 in | Wt 255.1 lb

## 2015-03-20 DIAGNOSIS — K219 Gastro-esophageal reflux disease without esophagitis: Secondary | ICD-10-CM

## 2015-03-20 DIAGNOSIS — K2970 Gastritis, unspecified, without bleeding: Secondary | ICD-10-CM | POA: Diagnosis not present

## 2015-03-20 MED ORDER — SUCRALFATE 1 G PO TABS: 1.0000 g | ORAL_TABLET | Freq: Three times a day (TID) | ORAL | Status: DC

## 2015-03-20 NOTE — Progress Notes (Signed)
   Subjective:    Patient ID: Jessica Good, female    DOB: Aug 13, 1950, 64 y.o.   MRN: [id number]  HPI Jessica Good is a 64 year old female with history of GERD, adenomatous colon polyps who is here for follow-up. She also has a history of hypertension, diabetes, osteoarthritis and anxiety. She's here today with her husband. She was last seen in June 2016 to evaluate GERD and left upper quadrant pain. This led to abdominal ultrasound which showed gallstones without acute cholecystitis. This was followed by EGD on 01/08/2015. Esophagus was normal. Erosive gastritis was found in the antrum. An 8 mm subepithelial lesion was found on the greater curve of the gastric antrum. The duodenum was normal. 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 She was started on Carafate tablets which she made into a slurry for reflux despite PPI. The gastric nodule was evaluated by EUS with Dr. Ardis Hughs. This did not communicate with the muscularis propria and was felt to be unlikely to be GIST. It was incompletely characterized in Dr. Ardis Hughs recommended repeat upper endoscopy in 12 month to check for interval change.  She reports dramatic improvement in her heartburn and upper abdominal pain with Carafate. Her husband actually stopped her omeprazole and symptoms remain very well controlled. Epigastric burning, cough and heartburn have resolved entirely. She underwent successful right total knee replacement recently. Bowel movements recently have been normal with no further constipation.   Review of Systems As per history of present illness, otherwise negative  Current Medications, Allergies, Past Medical History, Past Surgical History, Family History and Social History were reviewed in [employer] record.     Objective:   Physical Exam BP 118/82 mmHg  Pulse 76  Ht 5' 1.5" (1.562 m)  Wt 255 lb 2 oz (115.724 kg)  BMI 47.43 kg/m2 Constitutional: Well-developed and  well-nourished. No distress. HEENT: Normocephalic and atraumatic.  Conjunctivae are normal.  No scleral icterus. Neck: Neck supple. Trachea midline. Abdominal: Soft, nontender, nondistended. Bowel sounds active throughout. Marland Kitchen Extremities: no clubbing, cyanosis, or edema Neurological: Alert and oriented to person place and time. Psychiatric: Normal mood and affect. Behavior is normal.  Studies and records reviewed as per history of present illness     Assessment & Plan:  64 year old female with history of GERD, adenomatous colon polyps who is here for follow-up\  1. GERD/gastritis -- symptoms have resolved with Carafate slurry. She is very happy with the response. PPI has been stopped without return in symptoms. She previous he tried multiple PPIs including Dexilant and symptoms persisted. Given great response to Carafate she wishes to continue. Will plan to reduce to 3 times daily and then even 2 times daily if possible. If return of symptoms she can continue before meals and at bedtime 1 g. She did stop the medicine for 3 or 4 days while traveling in symptoms returned and took about a week to get back under control.  2. History of adenomatous colon polyp -- repeat colonoscopy November 2020  Follow-up in one year, sooner if necessary

## 2015-03-20 NOTE — Patient Instructions (Signed)
Please discontinue omeprazole.  Continue carafate before meals and at bedtime.  Follow up with Dr Hilarie Fredrickson in 1 year.

## 2015-06-25 DIAGNOSIS — M545 Low back pain: Secondary | ICD-10-CM | POA: Diagnosis not present

## 2015-07-10 ENCOUNTER — Encounter: Payer: Self-pay | Admitting: Sports Medicine

## 2015-07-10 ENCOUNTER — Ambulatory Visit (INDEPENDENT_AMBULATORY_CARE_PROVIDER_SITE_OTHER): Payer: Medicare Other | Admitting: Sports Medicine

## 2015-07-10 VITALS — BP 142/104 | Ht 61.0 in | Wt 250.0 lb

## 2015-07-10 DIAGNOSIS — M1712 Unilateral primary osteoarthritis, left knee: Secondary | ICD-10-CM

## 2015-07-10 DIAGNOSIS — R269 Unspecified abnormalities of gait and mobility: Secondary | ICD-10-CM

## 2015-07-10 NOTE — Assessment & Plan Note (Signed)
Ease into walking Ideally we can get her walking more with less pain to help weight loss Use orthotics during any exercise

## 2015-07-10 NOTE — Assessment & Plan Note (Signed)
Patient was fitted for a : standard, cushioned, semi-rigid orthotic. The orthotic was heated and afterward the patient stood on the orthotic blank positioned on the orthotic stand. The patient was positioned in subtalar neutral position and 10 degrees of ankle dorsiflexion in a weight bearing stance. After completion of molding, a stable base was applied to the orthotic blank. The blank was ground to a stable position for weight bearing. Size: 8 red EVA Base: blue EVA Posting: Scaphoid pad LT Additional orthotic padding: first ray post left  Post orthotic gait: She had improvement of genu valgus Pronation about 80% controlled Comfort with walking much better and most of limp resolved  Preparation , evaluation and face to face time 40 mins with more than half spent in cousneling on gait correction and use of orthotics

## 2015-07-10 NOTE — Progress Notes (Signed)
Patient ID: Jessica Good, female   DOB: 06-19-1950, 65 y.o.   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

## 2015-07-10 NOTE — Assessment & Plan Note (Signed)
Orthotics seem to lessen medial pain Will follow this

## 2015-07-30 DIAGNOSIS — R300 Dysuria: Secondary | ICD-10-CM | POA: Diagnosis not present

## 2015-07-30 DIAGNOSIS — M545 Low back pain: Secondary | ICD-10-CM | POA: Diagnosis not present

## 2015-07-30 DIAGNOSIS — H6121 Impacted cerumen, right ear: Secondary | ICD-10-CM | POA: Diagnosis not present

## 2015-07-30 DIAGNOSIS — H9192 Unspecified hearing loss, left ear: Secondary | ICD-10-CM | POA: Diagnosis not present

## 2015-07-31 DIAGNOSIS — R300 Dysuria: Secondary | ICD-10-CM | POA: Diagnosis not present

## 2015-08-10 DIAGNOSIS — M5416 Radiculopathy, lumbar region: Secondary | ICD-10-CM | POA: Diagnosis not present

## 2015-08-13 DIAGNOSIS — M5416 Radiculopathy, lumbar region: Secondary | ICD-10-CM | POA: Diagnosis not present

## 2015-08-14 DIAGNOSIS — M545 Low back pain: Secondary | ICD-10-CM | POA: Diagnosis not present

## 2015-08-18 DIAGNOSIS — M5416 Radiculopathy, lumbar region: Secondary | ICD-10-CM | POA: Diagnosis not present

## 2015-08-18 DIAGNOSIS — M545 Low back pain: Secondary | ICD-10-CM | POA: Diagnosis not present

## 2015-08-27 DIAGNOSIS — M25562 Pain in left knee: Secondary | ICD-10-CM | POA: Diagnosis not present

## 2015-09-03 DIAGNOSIS — M25562 Pain in left knee: Secondary | ICD-10-CM | POA: Diagnosis not present

## 2015-09-16 DIAGNOSIS — M5416 Radiculopathy, lumbar region: Secondary | ICD-10-CM | POA: Diagnosis not present

## 2015-09-16 DIAGNOSIS — M25562 Pain in left knee: Secondary | ICD-10-CM | POA: Diagnosis not present

## 2015-09-18 DIAGNOSIS — M5416 Radiculopathy, lumbar region: Secondary | ICD-10-CM | POA: Diagnosis not present

## 2015-09-18 DIAGNOSIS — M25562 Pain in left knee: Secondary | ICD-10-CM | POA: Diagnosis not present

## 2015-09-23 DIAGNOSIS — M25562 Pain in left knee: Secondary | ICD-10-CM | POA: Diagnosis not present

## 2015-09-23 DIAGNOSIS — M5416 Radiculopathy, lumbar region: Secondary | ICD-10-CM | POA: Diagnosis not present

## 2015-09-24 DIAGNOSIS — M5416 Radiculopathy, lumbar region: Secondary | ICD-10-CM | POA: Diagnosis not present

## 2015-10-01 ENCOUNTER — Other Ambulatory Visit: Payer: Self-pay | Admitting: Orthopedic Surgery

## 2015-10-01 DIAGNOSIS — M25562 Pain in left knee: Secondary | ICD-10-CM | POA: Diagnosis not present

## 2015-10-01 DIAGNOSIS — M5416 Radiculopathy, lumbar region: Secondary | ICD-10-CM | POA: Diagnosis not present

## 2015-10-10 ENCOUNTER — Ambulatory Visit
Admission: RE | Admit: 2015-10-10 | Discharge: 2015-10-10 | Disposition: A | Discharge status: Home / Self Care | Payer: Medicare Other | Source: Ambulatory Visit | Attending: Orthopedic Surgery | Admitting: Orthopedic Surgery

## 2015-10-10 DIAGNOSIS — M25562 Pain in left knee: Secondary | ICD-10-CM | POA: Diagnosis not present

## 2015-10-10 DIAGNOSIS — R300 Dysuria: Secondary | ICD-10-CM | POA: Diagnosis not present

## 2015-10-10 DIAGNOSIS — M25561 Pain in right knee: Secondary | ICD-10-CM | POA: Diagnosis not present

## 2015-10-10 MED ORDER — IOPAMIDOL (ISOVUE-M 200) INJECTION 41%
25.0000 mL | Freq: Once | INTRAMUSCULAR | Status: AC
  Administered 2015-10-10: 25 mL via INTRA_ARTICULAR

## 2015-10-13 DIAGNOSIS — M25562 Pain in left knee: Secondary | ICD-10-CM | POA: Diagnosis not present

## 2015-10-20 DIAGNOSIS — M25562 Pain in left knee: Secondary | ICD-10-CM | POA: Diagnosis not present

## 2015-10-24 DIAGNOSIS — M79672 Pain in left foot: Secondary | ICD-10-CM | POA: Diagnosis not present

## 2015-10-24 DIAGNOSIS — M25562 Pain in left knee: Secondary | ICD-10-CM | POA: Diagnosis not present

## 2015-10-24 NOTE — H&P (Signed)
PREOPERATIVE H&P Patient ID: Jessica Good MRN: [id number] DOB/AGE: 1950-12-24 65 y.o.  Chief Complaint: PSEUDOGOUT LEFT UNI KNEE  Planned Procedure Date: 11/03/15 Medical and Cardiac Clearance pending     HPI: Jessica Good is a 65 y.o. female who presents for evaluation of PSEUDOGOUT LEFT UNI KNEE. The patient has a history of pain and functional disability in the left knee due to arthritis and recently pseudogout and has failed non-surgical conservative treatments for greater than 12 weeks to include NSAID's and/or analgesics, corticosteriod injections, use of assistive devices and activity modification.  Onset of symptoms was gradual, starting 2 years ago with rapidlly worsening symptoms exacerbated at the end of March after a squatting and twisting motion. Prior procedures on the knee to include  unicompartmental arthroplasty on the left knee 11/26/14.  Patient currently rates pain at 6 out of 10 with activity. Patient has night pain, worsening of pain with activity and weight bearing and pain that interferes with activities of daily living. Aspiration confirms crystals consistent with pseudogout.  CT arthrogram.  shows joint space on this lateral side, but it has perhaps narrowed some - Unable to assess the meniscus, as the dye does not appear to be pooled around within the joint.There is no active infection.  Past Medical History  Diagnosis Date  . Hypertension   . Anxiety   . Osteoarthritis   . Constipation due to pain medication   . Urgency of urination   . Edema of lower extremity     Takes lasix PRN  . Hiatal hernia   . IBS (irritable bowel syndrome)   . Cataract   . Hyperlipidemia   . Osteoporosis     osteoarthritis  . Sleep apnea     never dx  . Diabetes mellitus without complication ([place])     boarderline no meds  . GERD (gastroesophageal reflux disease)     with meds  . Adenomatous colon polyp   . Gallstones   . Gastritis    Past Surgical History  Procedure  Laterality Date  . Ankle surgery Right   . Colonoscopy w/ polypectomy    . 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 Total knee arthroplasty Right 04/09/2014    Procedure: TOTAL KNEE ARTHROPLASTY;  Surgeon: Renette Butters, MD;  Location: [facility];  Service: Orthopedics;  Laterality: Right;  . Partial knee arthroplasty Left 11/26/2014    Procedure: LEFT UNICOMPARTMENTAL KNEE;  Surgeon: Renette Butters, MD;  Location: [facility];  Service: Orthopedics;  Laterality: Left;  . Breast lumpectomy  1996  . Joint replacement    . Eye surgery      lasic 14 yrs ago  . Eus N/A 02/06/2015    Procedure: UPPER ENDOSCOPIC ULTRASOUND (EUS) LINEAR;  Surgeon: Milus Banister, MD;  Location: [facility];  Service: Endoscopy;  Laterality: N/A;   Allergies  Allergen Reactions  . Sulfa Antibiotics     REACTION: Flu-like symptoms  . Other Rash    Adhesive on Bandaids causes "rash" on skin   Prior to Admission medications   Medication Sig Start Date End Date Taking? Authorizing Provider  aspirin 325 MG tablet Take 1 tablet (325 mg total) by mouth daily. 11/26/14   Brittney Claiborne Billings, PA-C  cholecalciferol (VITAMIN D) 1000 UNITS tablet Take 1,000 Units by mouth daily.    Historical Provider, MD  cyclobenzaprine (FLEXERIL) 10 MG tablet Take 1 tablet (10 mg total) by mouth 3 (three) times daily as needed for muscle  spasms. 11/26/14   Brittney Claiborne Billings, PA-C  furosemide (LASIX) 40 MG tablet Take 40 mg by mouth daily as needed for fluid.     Historical Provider, MD  lisinopril (PRINIVIL,ZESTRIL) 20 MG tablet Take 20 mg by mouth every other day.     Historical Provider, MD  meloxicam (MOBIC) 15 MG tablet Take 15 mg by mouth daily. 01/09/15   Historical Provider, MD  methocarbamol (ROBAXIN) 500 MG tablet Take 1 tablet by mouth every 8 (eight) hours as needed for muscle spasms.  01/09/15   Historical Provider, MD  Multiple Vitamin (MULTIVITAMIN WITH MINERALS) TABS Take 1 tablet by mouth daily.    Historical Provider, MD   omeprazole (PRILOSEC) 20 MG capsule Take 20 mg by mouth daily.    Historical Provider, MD  ondansetron (ZOFRAN) 4 MG tablet Take 1 tablet (4 mg total) by mouth every 8 (eight) hours as needed for nausea or vomiting. 11/26/14   Brittney Claiborne Billings, PA-C  predniSONE (STERAPRED UNI-PAK 48 TAB) 5 MG (48) TBPK tablet See admin instructions. 06/25/15   Historical Provider, MD  sucralfate (CARAFATE) 1 G tablet Take 1 tablet (1 g total) by mouth 4 (four) times daily -  with meals and at bedtime. 03/20/15   Jerene Bears, MD  vitamin C (ASCORBIC ACID) 500 MG tablet Take 500 mg by mouth daily.    Historical Provider, MD   Social History   Social History  . Marital Status: Married    Spouse Name: N/A  . Number of Children: N/A  . Years of Education: N/A   Social History Main Topics  . Smoking status: Never Smoker   . Smokeless tobacco: Never Used  . Alcohol Use: No  . Drug Use: No  . Sexual Activity: Not on file   Other Topics Concern  . Not on file   Social History Narrative   Family History  Problem Relation Age of Onset  . Diabetes Maternal Grandmother   . Breast cancer Neg Hx   . Celiac disease Neg Hx   . Cirrhosis Neg Hx   . Clotting disorder Neg Hx   . Colitis Neg Hx   . Colon cancer Neg Hx   . Colon polyps Neg Hx   . Crohn's disease Neg Hx   . Cystic fibrosis Neg Hx   . Esophageal cancer Neg Hx   . Hemochromatosis Neg Hx   . Inflammatory bowel disease Neg Hx   . Irritable bowel syndrome Neg Hx   . Kidney disease Neg Hx   . Liver cancer Neg Hx   . Liver disease Neg Hx   . Ovarian cancer Neg Hx   . Pancreatic cancer Mother     head of pancreas removed   . Prostate cancer Neg Hx   . Rectal cancer Neg Hx   . Stomach cancer Neg Hx   . Ulcerative colitis Neg Hx   . Uterine cancer Neg Hx   . Wilson's disease Neg Hx   . Hypertension Neg Hx   . Diabetes Father   . Heart disease Father     ROS: Currently denies lightheadedness, dizziness.  CP, SOB.  Some fever/chills with  recent UTI seen by her NP.  She has opoid related constipation taking meds with relief. No personal history of DVT, PE, MI, or CVA. She does have many loose teeth, both upper and lower.  No known current infection.  She has periodontal disease.  No dentures.  All other systems have been reviewed and were otherwise negative  with the exception of those mentioned in the HPI and as above.  Objective: Vitals: Ht: 5'1" Wt: 250 Temp: 98.0 BP: 126/82 Pulse: 84 O2 98% on room air. Physical Exam: General: Alert, NAD. Utilizes walker for ambulation. HEENT: EOMI, Good Neck Extension Pulm: No increased work of breathing.  Clear B/L A/P w/o crackle or wheeze.  CV: RRR, No m/g/r appreciated  GI: protuberant, soft, NT, ND Neuro: Neuro grossly intact b/l upper/lower ext.  Sensation intact distally Skin: No lesions in the area of chief complaint MSK/Surgical Site: Left knee w/o redness or effusion.  Valgus deformity. Lateral>Medial JLT.  5/5 strength in extension and flexion.  +EHL/FHL.  NVI.  Stable Lachman's and varus and valgus stress.   Imaging Review Aspiration confirms crystals consistent with pseudogout.  CT arthrogram.  shows joint space on this lateral side, but it has perhaps narrowed some - Unable to assess the meniscus, as the dye does not appear to be pooled around within the joint.There is no active infection.  Assessment: PSEUDOGOUT LEFT UNI KNEE  Plan: Plan for Procedure(s): TOTAL KNEE REVISION  The patient history, physical exam, clinical judgement of the provider and imaging are consistent with end stage degenerative joint disease and total joint arthroplasty is deemed medically necessary. The treatment options including medical management, injection therapy, and arthroplasty were discussed at length. The risks and benefits of Procedure(s): TOTAL KNEE REVISION were presented and reviewed.  The risks of nonoperative treatment, versus surgical intervention including but not limited to  continued pain, aseptic loosening, stiffness, dislocation/subluxation, infection, bleeding, nerve injury, blood clots, cardiopulmonary complications, morbidity, mortality, among others were discussed. The patient verbalizes understanding and wishes to proceed with the plan.  Patient is being admitted for inpatient treatment for surgery, pain control, PT, OT, prophylactic antibiotics, VTE prophylaxis, progressive ambulation, ADL's and discharge planning.   Dental prophylaxis discussed and recommended for 2 years postoperatively.  The patient does meet the criteria for TXA which will be used perioperatively via IV.   ASA 325 mg will be used postoperatively for DVT prophylaxis in addition to SCDs, and early ambulation. The patient is planning to be discharged home with home health services in care of her husband  Prudencio Burly III,PA-C 10/24/2015 11:44 AM

## 2015-10-27 ENCOUNTER — Encounter (HOSPITAL_COMMUNITY)
Admission: RE | Admit: 2015-10-27 | Discharge: 2015-10-27 | Disposition: A | Discharge status: Home / Self Care | Payer: Medicare Other | Source: Ambulatory Visit | Attending: Orthopedic Surgery | Admitting: Orthopedic Surgery

## 2015-10-27 ENCOUNTER — Encounter (HOSPITAL_COMMUNITY): Payer: Self-pay

## 2015-10-27 DIAGNOSIS — Z01818 Encounter for other preprocedural examination: Secondary | ICD-10-CM | POA: Diagnosis not present

## 2015-10-27 DIAGNOSIS — K219 Gastro-esophageal reflux disease without esophagitis: Secondary | ICD-10-CM | POA: Diagnosis not present

## 2015-10-27 DIAGNOSIS — I10 Essential (primary) hypertension: Secondary | ICD-10-CM | POA: Insufficient documentation

## 2015-10-27 DIAGNOSIS — M1120 Other chondrocalcinosis, unspecified site: Secondary | ICD-10-CM | POA: Diagnosis not present

## 2015-10-27 DIAGNOSIS — E785 Hyperlipidemia, unspecified: Secondary | ICD-10-CM | POA: Diagnosis not present

## 2015-10-27 DIAGNOSIS — Z01812 Encounter for preprocedural laboratory examination: Secondary | ICD-10-CM | POA: Diagnosis not present

## 2015-10-27 DIAGNOSIS — R9431 Abnormal electrocardiogram [ECG] [EKG]: Secondary | ICD-10-CM | POA: Insufficient documentation

## 2015-10-27 DIAGNOSIS — G4733 Obstructive sleep apnea (adult) (pediatric): Secondary | ICD-10-CM | POA: Insufficient documentation

## 2015-10-27 DIAGNOSIS — E119 Type 2 diabetes mellitus without complications: Secondary | ICD-10-CM | POA: Diagnosis not present

## 2015-10-27 DIAGNOSIS — M1712 Unilateral primary osteoarthritis, left knee: Secondary | ICD-10-CM | POA: Diagnosis not present

## 2015-10-27 DIAGNOSIS — Z0183 Encounter for blood typing: Secondary | ICD-10-CM | POA: Insufficient documentation

## 2015-10-27 DIAGNOSIS — Z79899 Other long term (current) drug therapy: Secondary | ICD-10-CM | POA: Insufficient documentation

## 2015-10-27 DIAGNOSIS — Z96651 Presence of right artificial knee joint: Secondary | ICD-10-CM | POA: Insufficient documentation

## 2015-10-27 DIAGNOSIS — Z7982 Long term (current) use of aspirin: Secondary | ICD-10-CM | POA: Insufficient documentation

## 2015-10-27 HISTORY — DX: Other specified disorders of teeth and supporting structures: K08.89

## 2015-10-27 HISTORY — DX: Anemia, unspecified: D64.9

## 2015-10-27 HISTORY — DX: Other chondrocalcinosis, unspecified site: M11.20

## 2015-10-27 LAB — SURGICAL PCR SCREEN
MRSA, PCR: NEGATIVE
Staphylococcus aureus: NEGATIVE

## 2015-10-27 LAB — COMPREHENSIVE METABOLIC PANEL
ALBUMIN: 3 g/dL — AB (ref 3.5–5.0)
ALT: 8 U/L — AB (ref 14–54)
AST: 17 U/L (ref 15–41)
Alkaline Phosphatase: 94 U/L (ref 38–126)
Anion gap: 8 (ref 5–15)
BUN: 6 mg/dL (ref 6–20)
CHLORIDE: 104 mmol/L (ref 101–111)
CO2: 27 mmol/L (ref 22–32)
CREATININE: 0.66 mg/dL (ref 0.44–1.00)
Calcium: 9.4 mg/dL (ref 8.9–10.3)
GFR calc Af Amer: >60 mL/min (ref >60)
GLUCOSE: 137 mg/dL — AB (ref 65–99)
POTASSIUM: 3.8 mmol/L (ref 3.5–5.1)
SODIUM: 139 mmol/L (ref 135–145)
Total Bilirubin: 0.4 mg/dL (ref 0.3–1.2)
Total Protein: 6.7 g/dL (ref 6.5–8.1)

## 2015-10-27 LAB — CBC WITH DIFFERENTIAL/PLATELET
BASOS ABS: 0 10*3/uL (ref 0.0–0.1)
BASOS PCT: 0 %
EOS PCT: 1 %
Eosinophils Absolute: 0.1 10*3/uL (ref 0.0–0.7)
HCT: 37.6 % (ref 36.0–46.0)
Hemoglobin: 11.5 g/dL — ABNORMAL LOW (ref 12.0–15.0)
LYMPHS PCT: 26 %
Lymphs Abs: 1.4 10*3/uL (ref 0.7–4.0)
MCH: 28.3 pg (ref 26.0–34.0)
MCHC: 30.6 g/dL (ref 30.0–36.0)
MCV: 92.4 fL (ref 78.0–100.0)
Monocytes Absolute: 0.4 10*3/uL (ref 0.1–1.0)
Monocytes Relative: 7 %
Neutro Abs: 3.6 10*3/uL (ref 1.7–7.7)
Neutrophils Relative %: 66 %
PLATELETS: 368 10*3/uL (ref 150–400)
RBC: 4.07 MIL/uL (ref 3.87–5.11)
RDW: 13.6 % (ref 11.5–15.5)
WBC: 5.4 10*3/uL (ref 4.0–10.5)

## 2015-10-27 LAB — URINE MICROSCOPIC-ADD ON

## 2015-10-27 LAB — PROTIME-INR
INR: 1.08 (ref 0.00–1.49)
Prothrombin Time: 14.2 seconds (ref 11.6–15.2)

## 2015-10-27 LAB — URINALYSIS, ROUTINE W REFLEX MICROSCOPIC
BILIRUBIN URINE: NEGATIVE
Glucose, UA: NEGATIVE mg/dL
Hgb urine dipstick: NEGATIVE
KETONES UR: NEGATIVE mg/dL
NITRITE: NEGATIVE
PROTEIN: NEGATIVE mg/dL
Specific Gravity, Urine: 1.012 (ref 1.005–1.030)
pH: 6 (ref 5.0–8.0)

## 2015-10-27 LAB — TYPE AND SCREEN
ABO/RH(D): B POS
ANTIBODY SCREEN: NEGATIVE

## 2015-10-27 LAB — APTT: APTT: 32 s (ref 24–37)

## 2015-10-27 LAB — GLUCOSE, CAPILLARY: GLUCOSE-CAPILLARY: 180 mg/dL — AB (ref 65–99)

## 2015-10-27 NOTE — Progress Notes (Signed)
Pt denies SOB, chest pain, and being under the care of a cardiologist. Pt denies having a cardiac cath and echo.  Pt denies having a chest x ray within the last year. Pt stated that she rarely checks her blood sugar when asked about FBS. Pt stated that an A1c was done last week at surgeon' office, called and spoke with Santiago Glad, nursing staff, to confirm and according to Santiago Glad, there was no record of a lab draw. Pt stated, " the cardiologist said that I should not stop taking Aspirin ,"  when instructed to stop taking Aspirin pre-operatively; spoke with Santiago Glad to clarify pre-op instructions for Aspirin ( waiting for f/u). Pt chart forwarded to anesthesia to review.

## 2015-10-27 NOTE — Pre-Procedure Instructions (Addendum)
Jessica Good  10/27/2015      [id number] [place], [place] - [address] [place] [address] Phone: [phone] Fax: [phone]    Your procedure is scheduled on Monday, November 03, 2015  Report to [facility] Admitting at 10:30 A.M.  Call this number if you have problems the morning of surgery:  [phone]   Remember:  Do not eat food or drink liquids after midnight Sunday, November 02, 2015  Take these medicines the morning of surgery with A SIP OF WATER :  if needed: pain medication ( Oxycodone or Tylenol) Stop taking  vitamins, fish oil and herbal medications. Do not take any NSAIDs ie: Ibuprofen, Advil, Naproxen, BC and Goody Powder,diclofenac (VOLTAREN)   ; stop now.   How to Manage Your Diabetes Before and After Surgery  Why is it important to control my blood sugar before and after surgery? . Improving blood sugar levels before and after surgery helps healing and can limit problems. . A way of improving blood sugar control is eating a healthy diet by: o  Eating less sugar and carbohydrates o  Increasing activity/exercise o  Talking with your doctor about reaching your blood sugar goals . High blood sugars (greater than 180 mg/dL) can raise your risk of infections and slow your recovery, so you will need to focus on controlling your diabetes during the weeks before surgery. . Make sure that the doctor who takes care of your diabetes knows about your planned surgery including the date and location.  How do I manage my blood sugar before surgery? . Check your blood sugar at least 4 times a day, starting 2 days before surgery, to make sure that the level is not too high or low. o Check your blood sugar the morning of your surgery when you wake up and every 2 hours until you get to the Short Stay unit. . If your blood sugar is less than 70 mg/dL, you will need to treat for low blood sugar: o Do  not take insulin. o Treat a low blood sugar (less than 70 mg/dL) with  cup of clear juice (cranberry or apple), 4 glucose tablets, OR glucose gel. o Recheck blood sugar in 15 minutes after treatment (to make sure it is greater than 70 mg/dL). If your blood sugar is not greater than 70 mg/dL on recheck, call [phone] for further instructions. . Report your blood sugar to the short stay nurse when you get to Short Stay.  . If you are admitted to the hospital after surgery: o Your blood sugar will be checked by the staff and you will probably be given insulin after surgery (instead of oral diabetes medicines) to make sure you have good blood sugar levels. o The goal for blood sugar control after surgery is 80-180 mg/dL.  WHAT DO I DO ABOUT MY DIABETES MEDICATION?   Marland Kitchen Do not take oral diabetes medicines (pills) the morning of surgery.  Patient Signature:  Date:   Nurse Signature:  Date:   Reviewed and Endorsed by [facility] Patient Education Committee, August 2015  Do not wear jewelry, make-up or nail polish.  Do not wear lotions, powders, or perfumes.  You may not wear deoderant.  Do not shave 48 hours prior to surgery.   Do not bring valuables to the hospital.  [facility] is not responsible for any belongings or valuables.  Contacts, dentures or bridgework  may not be worn into surgery.  Leave your suitcase in the car.  After surgery it may be brought to your room.  For patients admitted to the hospital, discharge time will be determined by your treatment team.  Patients discharged the day of surgery will not be allowed to drive home.   Name and phone number of your driver:   Special instructions: Special Instructions:Special Instructions: [facility] - Preparing for Surgery  Before surgery, you can play an important role.  Because skin is not sterile, your skin needs to be as free of germs as possible.  You can reduce the number of germs on you skin by washing with CHG  (chlorahexidine gluconate) soap before surgery.  CHG is an antiseptic cleaner which kills germs and bonds with the skin to continue killing germs even after washing.  Please DO NOT use if you have an allergy to CHG or antibacterial soaps.  If your skin becomes reddened/irritated stop using the CHG and inform your nurse when you arrive at Short Stay.  Do not shave (including legs and underarms) for at least 48 hours prior to the first CHG shower.  You may shave your face.  Please follow these instructions carefully:   1.  Shower with CHG Soap the night before surgery and the morning of Surgery.  2.  If you choose to wash your hair, wash your hair first as usual with your normal shampoo.  3.  After you shampoo, rinse your hair and body thoroughly to remove the Shampoo.  4.  Use CHG as you would any other liquid soap.  You can apply chg directly  to the skin and wash gently with scrungie or a clean washcloth.  5.  Apply the CHG Soap to your body ONLY FROM THE NECK DOWN.  Do not use on open wounds or open sores.  Avoid contact with your eyes, ears, mouth and genitals (private parts).  Wash genitals (private parts) with your normal soap.  6.  Wash thoroughly, paying special attention to the area where your surgery will be performed.  7.  Thoroughly rinse your body with warm water from the neck down.  8.  DO NOT shower/wash with your normal soap after using and rinsing off the CHG Soap.  9.  Pat yourself dry with a clean towel.            10.  Wear clean pajamas.            11.  Place clean sheets on your bed the night of your first shower and do not sleep with pets.  Day of Surgery  Do not apply any lotions/deodorants the morning of surgery.  Please wear clean clothes to the hospital/surgery center.  Please read over the following fact sheets that you were given. Pain Booklet, Coughing and Deep Breathing, Blood Transfusion Information, MRSA Information and Surgical Site Infection  Prevention

## 2015-10-28 LAB — HEMOGLOBIN A1C
HEMOGLOBIN A1C: 6.7 % — AB (ref 4.8–5.6)
Mean Plasma Glucose: 146 mg/dL

## 2015-10-28 NOTE — Progress Notes (Signed)
Anesthesia Chart Review:  Pt is a 65 year old female scheduled for L total knee revision on 11/03/2015 with Dr. Alain Marion.   PMH includes:  HTN, DM, hyperlipidemia, OSA, anemia, GERD. Never smoker. BMI 46. S/p L unicompartmental knee 11/26/14. S/p R TKA 04/09/14.   Medications include: ASA, lasix, lisinopril  Preoperative labs reviewed.  HgbA1c 6.7, glucose 137  EKG 10/27/15: NSR. Minimal voltage criteria for LVH, may be normal variant. Cannot rule out Anterior infarct, age undetermined.   Nuclear stress test 03/22/14 (correspondence 11/29/14 in media tab):  1. Systolic function is normal without regional wall motion abnormalities. EF 59%.  2. No ischemia. Low risk stress test.   Pt tolerated 2 prior knee replacement surgeries in last 1.5 years. If no changes, I anticipate pt can proceed with surgery as scheduled.   Willeen Cass, FNP-BC [facility] Short Stay Surgical Center/Anesthesiology Phone: [phone] 10/28/2015 4:25 PM

## 2015-10-29 NOTE — Progress Notes (Signed)
A voice message was left on [employer] voice mailbox regarding follow-up for pt pre-op instructions regarding Aspirin. Awaiting a return call.

## 2015-10-31 MED ORDER — TRANEXAMIC ACID 1000 MG/10ML IV SOLN
1000.0000 mg | INTRAVENOUS | Status: AC
  Administered 2015-11-03: 1000 mg via INTRAVENOUS
  Filled 2015-10-31: qty 10

## 2015-11-03 ENCOUNTER — Inpatient Hospital Stay (HOSPITAL_COMMUNITY)
Admission: RE | Admit: 2015-11-03 | Discharge: 2015-11-07 | DRG: 467 | Disposition: A | Discharge status: Home Health | Payer: Medicare Other | Source: Ambulatory Visit | Attending: Orthopedic Surgery | Admitting: Orthopedic Surgery

## 2015-11-03 ENCOUNTER — Encounter (HOSPITAL_COMMUNITY)
Admission: RE | Disposition: A | Discharge status: Home Health | Payer: Self-pay | Source: Ambulatory Visit | Attending: Orthopedic Surgery

## 2015-11-03 ENCOUNTER — Inpatient Hospital Stay (HOSPITAL_COMMUNITY): Payer: Medicare Other | Admitting: Emergency Medicine

## 2015-11-03 ENCOUNTER — Inpatient Hospital Stay (HOSPITAL_COMMUNITY): Payer: Medicare Other

## 2015-11-03 ENCOUNTER — Inpatient Hospital Stay (HOSPITAL_COMMUNITY): Payer: Medicare Other | Admitting: Certified Registered Nurse Anesthetist

## 2015-11-03 ENCOUNTER — Encounter (HOSPITAL_COMMUNITY): Payer: Self-pay | Admitting: *Deleted

## 2015-11-03 DIAGNOSIS — B9689 Other specified bacterial agents as the cause of diseases classified elsewhere: Secondary | ICD-10-CM | POA: Diagnosis not present

## 2015-11-03 DIAGNOSIS — Z888 Allergy status to other drugs, medicaments and biological substances status: Secondary | ICD-10-CM | POA: Diagnosis not present

## 2015-11-03 DIAGNOSIS — K589 Irritable bowel syndrome without diarrhea: Secondary | ICD-10-CM | POA: Diagnosis present

## 2015-11-03 DIAGNOSIS — T402X5A Adverse effect of other opioids, initial encounter: Secondary | ICD-10-CM | POA: Diagnosis not present

## 2015-11-03 DIAGNOSIS — M11269 Other chondrocalcinosis, unspecified knee: Secondary | ICD-10-CM | POA: Diagnosis present

## 2015-11-03 DIAGNOSIS — E119 Type 2 diabetes mellitus without complications: Secondary | ICD-10-CM | POA: Diagnosis present

## 2015-11-03 DIAGNOSIS — Z800 Family history of malignant neoplasm of digestive organs: Secondary | ICD-10-CM | POA: Diagnosis not present

## 2015-11-03 DIAGNOSIS — B954 Other streptococcus as the cause of diseases classified elsewhere: Secondary | ICD-10-CM | POA: Diagnosis not present

## 2015-11-03 DIAGNOSIS — Z9889 Other specified postprocedural states: Secondary | ICD-10-CM

## 2015-11-03 DIAGNOSIS — E785 Hyperlipidemia, unspecified: Secondary | ICD-10-CM | POA: Diagnosis present

## 2015-11-03 DIAGNOSIS — Y838 Other surgical procedures as the cause of abnormal reaction of the patient, or of later complication, without mention of misadventure at the time of the procedure: Secondary | ICD-10-CM | POA: Diagnosis not present

## 2015-11-03 DIAGNOSIS — D62 Acute posthemorrhagic anemia: Secondary | ICD-10-CM | POA: Diagnosis not present

## 2015-11-03 DIAGNOSIS — Z6841 Body Mass Index (BMI) 40.0 and over, adult: Secondary | ICD-10-CM

## 2015-11-03 DIAGNOSIS — M00862 Arthritis due to other bacteria, left knee: Secondary | ICD-10-CM | POA: Diagnosis not present

## 2015-11-03 DIAGNOSIS — Z7952 Long term (current) use of systemic steroids: Secondary | ICD-10-CM

## 2015-11-03 DIAGNOSIS — G4730 Sleep apnea, unspecified: Secondary | ICD-10-CM | POA: Diagnosis present

## 2015-11-03 DIAGNOSIS — Z7982 Long term (current) use of aspirin: Secondary | ICD-10-CM

## 2015-11-03 DIAGNOSIS — T8489XA Other specified complication of internal orthopedic prosthetic devices, implants and grafts, initial encounter: Secondary | ICD-10-CM | POA: Diagnosis not present

## 2015-11-03 DIAGNOSIS — Z79899 Other long term (current) drug therapy: Secondary | ICD-10-CM

## 2015-11-03 DIAGNOSIS — Z8249 Family history of ischemic heart disease and other diseases of the circulatory system: Secondary | ICD-10-CM

## 2015-11-03 DIAGNOSIS — F419 Anxiety disorder, unspecified: Secondary | ICD-10-CM | POA: Diagnosis present

## 2015-11-03 DIAGNOSIS — H269 Unspecified cataract: Secondary | ICD-10-CM | POA: Diagnosis present

## 2015-11-03 DIAGNOSIS — Z96651 Presence of right artificial knee joint: Secondary | ICD-10-CM | POA: Diagnosis present

## 2015-11-03 DIAGNOSIS — M11262 Other chondrocalcinosis, left knee: Principal | ICD-10-CM | POA: Diagnosis present

## 2015-11-03 DIAGNOSIS — Z91048 Other nonmedicinal substance allergy status: Secondary | ICD-10-CM

## 2015-11-03 DIAGNOSIS — Z833 Family history of diabetes mellitus: Secondary | ICD-10-CM

## 2015-11-03 DIAGNOSIS — K5903 Drug induced constipation: Secondary | ICD-10-CM | POA: Diagnosis not present

## 2015-11-03 DIAGNOSIS — Z882 Allergy status to sulfonamides status: Secondary | ICD-10-CM | POA: Diagnosis not present

## 2015-11-03 DIAGNOSIS — Z96652 Presence of left artificial knee joint: Secondary | ICD-10-CM | POA: Diagnosis not present

## 2015-11-03 DIAGNOSIS — G8918 Other acute postprocedural pain: Secondary | ICD-10-CM | POA: Diagnosis not present

## 2015-11-03 DIAGNOSIS — T8450XA Infection and inflammatory reaction due to unspecified internal joint prosthesis, initial encounter: Secondary | ICD-10-CM | POA: Insufficient documentation

## 2015-11-03 DIAGNOSIS — T8454XD Infection and inflammatory reaction due to internal left knee prosthesis, subsequent encounter: Secondary | ICD-10-CM | POA: Diagnosis not present

## 2015-11-03 DIAGNOSIS — K219 Gastro-esophageal reflux disease without esophagitis: Secondary | ICD-10-CM | POA: Diagnosis present

## 2015-11-03 DIAGNOSIS — M810 Age-related osteoporosis without current pathological fracture: Secondary | ICD-10-CM | POA: Diagnosis present

## 2015-11-03 DIAGNOSIS — I10 Essential (primary) hypertension: Secondary | ICD-10-CM | POA: Diagnosis present

## 2015-11-03 DIAGNOSIS — Z471 Aftercare following joint replacement surgery: Secondary | ICD-10-CM | POA: Diagnosis not present

## 2015-11-03 DIAGNOSIS — M1712 Unilateral primary osteoarthritis, left knee: Secondary | ICD-10-CM | POA: Diagnosis present

## 2015-11-03 DIAGNOSIS — M25562 Pain in left knee: Secondary | ICD-10-CM | POA: Insufficient documentation

## 2015-11-03 DIAGNOSIS — R269 Unspecified abnormalities of gait and mobility: Secondary | ICD-10-CM

## 2015-11-03 DIAGNOSIS — M1710 Unilateral primary osteoarthritis, unspecified knee: Secondary | ICD-10-CM | POA: Diagnosis present

## 2015-11-03 HISTORY — PX: TOTAL KNEE REVISION: SHX996

## 2015-11-03 LAB — GLUCOSE, CAPILLARY
GLUCOSE-CAPILLARY: 136 mg/dL — AB (ref 65–99)
Glucose-Capillary: 88 mg/dL (ref 65–99)
Glucose-Capillary: 128 mg/dL — ABNORMAL HIGH (ref 65–99)

## 2015-11-03 SURGERY — TOTAL KNEE REVISION
Anesthesia: Regional | Site: Knee | Laterality: Left

## 2015-11-03 MED ORDER — ONDANSETRON HCL 4 MG/2ML IJ SOLN: 4.0000 mg | Freq: Four times a day (QID) | INTRAMUSCULAR | Status: DC | PRN

## 2015-11-03 MED ORDER — DEXAMETHASONE SODIUM PHOSPHATE 10 MG/ML IJ SOLN
10.0000 mg | Freq: Once | INTRAMUSCULAR | Status: AC
  Administered 2015-11-04: 10 mg via INTRAVENOUS
  Filled 2015-11-03: qty 1

## 2015-11-03 MED ORDER — CEFAZOLIN SODIUM-DEXTROSE 2-4 GM/100ML-% IV SOLN
2.0000 g | INTRAVENOUS | Status: AC
  Administered 2015-11-03: 2 g via INTRAVENOUS
  Filled 2015-11-03: qty 100

## 2015-11-03 MED ORDER — MIDAZOLAM HCL 2 MG/2ML IJ SOLN
INTRAMUSCULAR | Status: AC
  Filled 2015-11-03: qty 2

## 2015-11-03 MED ORDER — ALUM & MAG HYDROXIDE-SIMETH 200-200-20 MG/5ML PO SUSP: 30.0000 mL | ORAL | Status: DC | PRN

## 2015-11-03 MED ORDER — ACETAMINOPHEN 500 MG PO TABS
1000.0000 mg | ORAL_TABLET | Freq: Once | ORAL | Status: AC
  Administered 2015-11-03: 1000 mg via ORAL
  Filled 2015-11-03: qty 2

## 2015-11-03 MED ORDER — OMEPRAZOLE 20 MG PO CPDR: 20.0000 mg | DELAYED_RELEASE_CAPSULE | Freq: Every day | ORAL | Status: DC

## 2015-11-03 MED ORDER — PROPOFOL 10 MG/ML IV BOLUS
INTRAVENOUS | Status: DC | PRN
  Administered 2015-11-03 (×2): 20 mg via INTRAVENOUS

## 2015-11-03 MED ORDER — ROPIVACAINE HCL 7.5 MG/ML IJ SOLN
INTRAMUSCULAR | Status: DC | PRN
  Administered 2015-11-03: 20 mL via PERINEURAL

## 2015-11-03 MED ORDER — HYDROMORPHONE HCL 1 MG/ML IJ SOLN
INTRAMUSCULAR | Status: AC
  Filled 2015-11-03: qty 1

## 2015-11-03 MED ORDER — ASPIRIN EC 325 MG PO TBEC: 325.0000 mg | DELAYED_RELEASE_TABLET | Freq: Every day | ORAL | Status: DC

## 2015-11-03 MED ORDER — OXYCODONE HCL 5 MG/5ML PO SOLN: 5.0000 mg | Freq: Once | ORAL | Status: AC | PRN

## 2015-11-03 MED ORDER — OXYCODONE HCL 5 MG PO TABS
ORAL_TABLET | ORAL | Status: AC
  Filled 2015-11-03: qty 1

## 2015-11-03 MED ORDER — MORPHINE SULFATE (PF) 2 MG/ML IV SOLN
2.0000 mg | INTRAVENOUS | Status: DC | PRN
  Administered 2015-11-03 – 2015-11-06 (×14): 2 mg via INTRAVENOUS
  Filled 2015-11-03 (×15): qty 1

## 2015-11-03 MED ORDER — ASPIRIN EC 325 MG PO TBEC
325.0000 mg | DELAYED_RELEASE_TABLET | Freq: Every day | ORAL | Status: DC
  Administered 2015-11-04 – 2015-11-07 (×4): 325 mg via ORAL
  Filled 2015-11-03 (×4): qty 1

## 2015-11-03 MED ORDER — LISINOPRIL 20 MG PO TABS
20.0000 mg | ORAL_TABLET | Freq: Every day | ORAL | Status: DC
  Administered 2015-11-03 – 2015-11-07 (×4): 20 mg via ORAL
  Filled 2015-11-03 (×5): qty 1

## 2015-11-03 MED ORDER — METHOCARBAMOL 500 MG PO TABS: 500.0000 mg | ORAL_TABLET | Freq: Four times a day (QID) | ORAL | Status: DC | PRN

## 2015-11-03 MED ORDER — 0.9 % SODIUM CHLORIDE (POUR BTL) OPTIME
TOPICAL | Status: DC | PRN
  Administered 2015-11-03: 1000 mL

## 2015-11-03 MED ORDER — FENTANYL CITRATE (PF) 100 MCG/2ML IJ SOLN
INTRAMUSCULAR | Status: DC | PRN
  Administered 2015-11-03: 100 ug via INTRAVENOUS
  Administered 2015-11-03 (×2): 25 ug via INTRAVENOUS
  Administered 2015-11-03: 50 ug via INTRAVENOUS
  Administered 2015-11-03 (×2): 25 ug via INTRAVENOUS

## 2015-11-03 MED ORDER — SODIUM CHLORIDE FLUSH 0.9 % IV SOLN
INTRAVENOUS | Status: DC | PRN
  Administered 2015-11-03: 30 mL

## 2015-11-03 MED ORDER — OXYCODONE HCL 5 MG PO TABS
5.0000 mg | ORAL_TABLET | ORAL | Status: DC | PRN
  Administered 2015-11-03: 10 mg via ORAL
  Administered 2015-11-03: 5 mg via ORAL
  Administered 2015-11-04 – 2015-11-07 (×20): 10 mg via ORAL
  Filled 2015-11-03 (×22): qty 2

## 2015-11-03 MED ORDER — KETOROLAC TROMETHAMINE 15 MG/ML IJ SOLN
7.5000 mg | Freq: Four times a day (QID) | INTRAMUSCULAR | Status: AC
  Administered 2015-11-03 – 2015-11-04 (×4): 7.5 mg via INTRAVENOUS
  Filled 2015-11-03 (×4): qty 1

## 2015-11-03 MED ORDER — SODIUM CHLORIDE 0.9 % IR SOLN
Status: DC | PRN
  Administered 2015-11-03: 3000 mL

## 2015-11-03 MED ORDER — PROPOFOL 500 MG/50ML IV EMUL
INTRAVENOUS | Status: DC | PRN
  Administered 2015-11-03: 100 ug/kg/min via INTRAVENOUS

## 2015-11-03 MED ORDER — ACETAMINOPHEN 650 MG RE SUPP: 650.0000 mg | Freq: Four times a day (QID) | RECTAL | Status: DC | PRN

## 2015-11-03 MED ORDER — METHOCARBAMOL 500 MG PO TABS
ORAL_TABLET | ORAL | Status: AC
  Filled 2015-11-03: qty 1

## 2015-11-03 MED ORDER — INSULIN ASPART 100 UNIT/ML ~~LOC~~ SOLN
0.0000 [IU] | SUBCUTANEOUS | Status: DC
  Administered 2015-11-03 – 2015-11-04 (×2): 2 [IU] via SUBCUTANEOUS
  Administered 2015-11-04: 3 [IU] via SUBCUTANEOUS
  Administered 2015-11-04: 2 [IU] via SUBCUTANEOUS
  Administered 2015-11-04 (×2): 4 [IU] via SUBCUTANEOUS
  Administered 2015-11-05 (×3): 2 [IU] via SUBCUTANEOUS
  Administered 2015-11-05: 4 [IU] via SUBCUTANEOUS
  Administered 2015-11-06 (×3): 2 [IU] via SUBCUTANEOUS
  Administered 2015-11-06: 4 [IU] via SUBCUTANEOUS
  Administered 2015-11-06 – 2015-11-07 (×4): 2 [IU] via SUBCUTANEOUS

## 2015-11-03 MED ORDER — OXYCODONE-ACETAMINOPHEN 5-325 MG PO TABS
1.0000 | ORAL_TABLET | Freq: Once | ORAL | Status: AC
  Administered 2015-11-03: 1 via ORAL

## 2015-11-03 MED ORDER — LACTATED RINGERS IV SOLN
INTRAVENOUS | Status: DC
  Administered 2015-11-03 (×4): via INTRAVENOUS

## 2015-11-03 MED ORDER — METOCLOPRAMIDE HCL 5 MG/ML IJ SOLN: 5.0000 mg | Freq: Three times a day (TID) | INTRAMUSCULAR | Status: DC | PRN

## 2015-11-03 MED ORDER — METOCLOPRAMIDE HCL 5 MG PO TABS: 5.0000 mg | ORAL_TABLET | Freq: Three times a day (TID) | ORAL | Status: DC | PRN

## 2015-11-03 MED ORDER — METHOCARBAMOL 1000 MG/10ML IJ SOLN
500.0000 mg | Freq: Four times a day (QID) | INTRAMUSCULAR | Status: DC | PRN
  Filled 2015-11-03: qty 5

## 2015-11-03 MED ORDER — PHENOL 1.4 % MT LIQD: 1.0000 | OROMUCOSAL | Status: DC | PRN

## 2015-11-03 MED ORDER — ONDANSETRON HCL 4 MG/2ML IJ SOLN
INTRAMUSCULAR | Status: DC | PRN
  Administered 2015-11-03: 4 mg via INTRAVENOUS

## 2015-11-03 MED ORDER — OXYCODONE HCL 5 MG PO TABS
5.0000 mg | ORAL_TABLET | Freq: Once | ORAL | Status: AC | PRN
  Administered 2015-11-03: 5 mg via ORAL

## 2015-11-03 MED ORDER — FENTANYL CITRATE (PF) 100 MCG/2ML IJ SOLN
INTRAMUSCULAR | Status: AC
  Filled 2015-11-03: qty 2

## 2015-11-03 MED ORDER — ASPIRIN 325 MG PO TABS: 325.0000 mg | ORAL_TABLET | Freq: Every day | ORAL | Status: DC

## 2015-11-03 MED ORDER — OXYCODONE-ACETAMINOPHEN 5-325 MG PO TABS: 1.0000 | ORAL_TABLET | ORAL | Status: DC | PRN

## 2015-11-03 MED ORDER — CHLORHEXIDINE GLUCONATE 4 % EX LIQD
60.0000 mL | Freq: Once | CUTANEOUS | Status: DC
Start: 2015-11-03 — End: 2015-11-03

## 2015-11-03 MED ORDER — DIPHENHYDRAMINE HCL 12.5 MG/5ML PO ELIX: 12.5000 mg | ORAL_SOLUTION | ORAL | Status: DC | PRN

## 2015-11-03 MED ORDER — OXYCODONE-ACETAMINOPHEN 5-325 MG PO TABS
ORAL_TABLET | ORAL | Status: AC
  Filled 2015-11-03: qty 1

## 2015-11-03 MED ORDER — MIDAZOLAM HCL 5 MG/5ML IJ SOLN
INTRAMUSCULAR | Status: DC | PRN
  Administered 2015-11-03: 2 mg via INTRAVENOUS

## 2015-11-03 MED ORDER — DOCUSATE SODIUM 100 MG PO CAPS
100.0000 mg | ORAL_CAPSULE | Freq: Two times a day (BID) | ORAL | Status: DC
Start: 2015-11-03 — End: 2015-11-07
  Administered 2015-11-03 – 2015-11-07 (×8): 100 mg via ORAL
  Filled 2015-11-03 (×8): qty 1

## 2015-11-03 MED ORDER — FUROSEMIDE 40 MG PO TABS: 40.0000 mg | ORAL_TABLET | Freq: Every day | ORAL | Status: DC | PRN

## 2015-11-03 MED ORDER — KETOROLAC TROMETHAMINE 30 MG/ML IJ SOLN
INTRAMUSCULAR | Status: DC | PRN
  Administered 2015-11-03: 30 mg

## 2015-11-03 MED ORDER — FENTANYL CITRATE (PF) 250 MCG/5ML IJ SOLN
INTRAMUSCULAR | Status: AC
  Filled 2015-11-03: qty 5

## 2015-11-03 MED ORDER — ONDANSETRON HCL 4 MG PO TABS: 4.0000 mg | ORAL_TABLET | Freq: Four times a day (QID) | ORAL | Status: DC | PRN

## 2015-11-03 MED ORDER — HYDROMORPHONE HCL 1 MG/ML IJ SOLN
0.2500 mg | INTRAMUSCULAR | Status: DC | PRN
  Administered 2015-11-03 (×3): 0.5 mg via INTRAVENOUS

## 2015-11-03 MED ORDER — LACTATED RINGERS IV SOLN
INTRAVENOUS | Status: DC
  Administered 2015-11-04: 09:00:00 via INTRAVENOUS

## 2015-11-03 MED ORDER — POLYETHYLENE GLYCOL 3350 17 G PO PACK
17.0000 g | PACK | Freq: Every day | ORAL | Status: DC | PRN
  Administered 2015-11-06: 17 g via ORAL
  Filled 2015-11-03: qty 1

## 2015-11-03 MED ORDER — BUPIVACAINE HCL (PF) 0.25 % IJ SOLN
INTRAMUSCULAR | Status: AC
  Filled 2015-11-03: qty 30

## 2015-11-03 MED ORDER — BUPIVACAINE HCL (PF) 0.25 % IJ SOLN
INTRAMUSCULAR | Status: DC | PRN
  Administered 2015-11-03: 30 mL

## 2015-11-03 MED ORDER — METHOCARBAMOL 500 MG PO TABS
500.0000 mg | ORAL_TABLET | Freq: Four times a day (QID) | ORAL | Status: DC | PRN
  Administered 2015-11-03 – 2015-11-07 (×13): 500 mg via ORAL
  Filled 2015-11-03 (×12): qty 1

## 2015-11-03 MED ORDER — CEFAZOLIN SODIUM-DEXTROSE 2-4 GM/100ML-% IV SOLN
2.0000 g | Freq: Four times a day (QID) | INTRAVENOUS | Status: AC
  Administered 2015-11-03 – 2015-11-04 (×2): 2 g via INTRAVENOUS
  Filled 2015-11-03 (×2): qty 100

## 2015-11-03 MED ORDER — MENTHOL 3 MG MT LOZG: 1.0000 | LOZENGE | OROMUCOSAL | Status: DC | PRN

## 2015-11-03 MED ORDER — ONDANSETRON HCL 4 MG PO TABS: 4.0000 mg | ORAL_TABLET | Freq: Three times a day (TID) | ORAL | Status: DC | PRN

## 2015-11-03 MED ORDER — ACETAMINOPHEN 325 MG PO TABS
650.0000 mg | ORAL_TABLET | Freq: Four times a day (QID) | ORAL | Status: DC | PRN
  Administered 2015-11-04 (×2): 650 mg via ORAL
  Filled 2015-11-03 (×2): qty 2

## 2015-11-03 MED ORDER — DEXTROSE-NACL 5-0.45 % IV SOLN: 100.0000 mL/h | INTRAVENOUS | Status: DC

## 2015-11-03 SURGICAL SUPPLY — 79 items
BANDAGE ESMARK 6X9 LF (GAUZE/BANDAGES/DRESSINGS) ×1 IMPLANT
BEARING TIBIAL 71/75X12 (Knees) ×1 IMPLANT
BEARING TIBIAL 71/75X12MM (Knees) ×1 IMPLANT
BLADE 10 SAFETY STRL DISP (BLADE) ×10 IMPLANT
BLADE SAG 18X100X1.27 (BLADE) ×3 IMPLANT
BNDG CMPR 9X6 STRL LF SNTH (GAUZE/BANDAGES/DRESSINGS) ×1
BNDG COHESIVE 6X5 TAN STRL LF (GAUZE/BANDAGES/DRESSINGS) ×3 IMPLANT
BNDG ESMARK 6X9 LF (GAUZE/BANDAGES/DRESSINGS) ×3
BONE CEMENT PALACOS W/GENTAMIC (Orthopedic Implant) ×4 IMPLANT
BOWL SMART MIX CTS (DISPOSABLE) ×3 IMPLANT
BRNG TIB 0D 71/75 12 POST (Knees) ×1 IMPLANT
CLOSURE STERI-STRIP 1/2X4 (GAUZE/BANDAGES/DRESSINGS) ×1
CLSR STERI-STRIP ANTIMIC 1/2X4 (GAUZE/BANDAGES/DRESSINGS) ×3 IMPLANT
COMP FEMORAL VNGD 62.5MM (Femur) ×2 IMPLANT
COMPONENT FEMORAL VNGD 62.5MM (Femur) IMPLANT
CONT SPEC 4OZ CLIKSEAL STRL BL (MISCELLANEOUS) ×2 IMPLANT
COVER SURGICAL LIGHT HANDLE (MISCELLANEOUS) ×3 IMPLANT
CUFF TOURNIQUET SINGLE 34IN LL (TOURNIQUET CUFF) ×1 IMPLANT
CUFF TOURNIQUET SINGLE 44IN (TOURNIQUET CUFF) ×4 IMPLANT
DRAPE EXTREMITY T 121X128X90 (DRAPE) ×3 IMPLANT
DRAPE U-SHAPE 47X51 STRL (DRAPES) ×3 IMPLANT
DRSG ADAPTIC 3X8 NADH LF (GAUZE/BANDAGES/DRESSINGS) ×1 IMPLANT
DRSG MEPILEX BORDER 4X12 (GAUZE/BANDAGES/DRESSINGS) ×2 IMPLANT
DRSG MEPILEX BORDER 4X8 (GAUZE/BANDAGES/DRESSINGS) ×1 IMPLANT
DURAPREP 26ML APPLICATOR (WOUND CARE) ×6 IMPLANT
ELECT CAUTERY BLADE 6.4 (BLADE) ×3 IMPLANT
ELECT REM PT RETURN 9FT ADLT (ELECTROSURGICAL) ×3
ELECTRODE REM PT RTRN 9FT ADLT (ELECTROSURGICAL) ×1 IMPLANT
FACESHIELD WRAPAROUND (MASK) ×6 IMPLANT
FACESHIELD WRAPAROUND OR TEAM (MASK) ×2 IMPLANT
GAUZE SPONGE 4X4 12PLY STRL (GAUZE/BANDAGES/DRESSINGS) ×1 IMPLANT
GLOVE BIO SURGEON STRL SZ7 (GLOVE) ×3 IMPLANT
GLOVE BIO SURGEON STRL SZ7.5 (GLOVE) ×3 IMPLANT
GLOVE BIOGEL PI IND STRL 6.5 (GLOVE) IMPLANT
GLOVE BIOGEL PI IND STRL 7.0 (GLOVE) ×1 IMPLANT
GLOVE BIOGEL PI IND STRL 8 (GLOVE) ×1 IMPLANT
GLOVE BIOGEL PI INDICATOR 6.5 (GLOVE) ×4
GLOVE BIOGEL PI INDICATOR 7.0 (GLOVE) ×4
GLOVE BIOGEL PI INDICATOR 8 (GLOVE) ×4
GLOVE SURG SS PI 7.0 STRL IVOR (GLOVE) ×2 IMPLANT
GLOVE SURG SS PI 7.5 STRL IVOR (GLOVE) ×6 IMPLANT
GOWN STRL REUS W/ TWL LRG LVL3 (GOWN DISPOSABLE) ×2 IMPLANT
GOWN STRL REUS W/ TWL XL LVL3 (GOWN DISPOSABLE) IMPLANT
GOWN STRL REUS W/TWL LRG LVL3 (GOWN DISPOSABLE) ×6
GOWN STRL REUS W/TWL XL LVL3 (GOWN DISPOSABLE) ×3
HANDPIECE INTERPULSE COAX TIP (DISPOSABLE) ×3
IMMOBILIZER KNEE 22 UNIV (SOFTGOODS) ×3 IMPLANT
IMMOBILIZER KNEE 24 THIGH 36 (MISCELLANEOUS) IMPLANT
IMMOBILIZER KNEE 24 UNIV (MISCELLANEOUS)
KIT BASIN OR (CUSTOM PROCEDURE TRAY) ×3 IMPLANT
KIT ROOM TURNOVER OR (KITS) ×3 IMPLANT
LIQUID BAND (GAUZE/BANDAGES/DRESSINGS) IMPLANT
MANIFOLD NEPTUNE II (INSTRUMENTS) ×3 IMPLANT
NDL 18GX1X1/2 (RX/OR ONLY) (NEEDLE) IMPLANT
NEEDLE 18GX1X1/2 (RX/OR ONLY) (NEEDLE) ×6 IMPLANT
NS IRRIG 1000ML POUR BTL (IV SOLUTION) ×3 IMPLANT
PACK ORTHO EXTREMITY (CUSTOM PROCEDURE TRAY) ×2 IMPLANT
PACK TOTAL JOINT (CUSTOM PROCEDURE TRAY) ×3 IMPLANT
PACK UNIVERSAL I (CUSTOM PROCEDURE TRAY) ×3 IMPLANT
PAD ARMBOARD 7.5X6 YLW CONV (MISCELLANEOUS) ×3 IMPLANT
PATELLA STD 34X8.5 (Orthopedic Implant) ×2 IMPLANT
PLATE KNEE TIBIAL 71MM FIXED (Plate) ×2 IMPLANT
SET HNDPC FAN SPRY TIP SCT (DISPOSABLE) IMPLANT
STAPLER VISISTAT 35W (STAPLE) IMPLANT
SUCTION FRAZIER HANDLE 10FR (MISCELLANEOUS) ×2
SUCTION TUBE FRAZIER 10FR DISP (MISCELLANEOUS) ×1 IMPLANT
SUT MNCRL AB 4-0 PS2 18 (SUTURE) ×3 IMPLANT
SUT MON AB 2-0 CT1 27 (SUTURE) ×12 IMPLANT
SUT VIC AB 0 CT1 27 (SUTURE) ×3
SUT VIC AB 0 CT1 27XBRD ANBCTR (SUTURE) ×1 IMPLANT
SUT VIC AB 1 CTX 36 (SUTURE) ×3
SUT VIC AB 1 CTX36XBRD ANBCTR (SUTURE) ×1 IMPLANT
SUT VIC AB 2-0 SH 27 (SUTURE) ×6
SUT VIC AB 2-0 SH 27XBRD (SUTURE) IMPLANT
SYR 30ML LL (SYRINGE) ×2 IMPLANT
SYRINGE 3CC LL L/F (MISCELLANEOUS) ×2 IMPLANT
TOWEL OR 17X24 6PK STRL BLUE (TOWEL DISPOSABLE) ×3 IMPLANT
TOWEL OR 17X26 10 PK STRL BLUE (TOWEL DISPOSABLE) ×3 IMPLANT
TRAY FOLEY METER SIL LF 16FR (CATHETERS) ×2 IMPLANT

## 2015-11-03 NOTE — Addendum Note (Signed)
Addendum  created 11/03/15 1823 by Albertha Ghee, MD   Modules edited: Orders, PRL Based Order Sets

## 2015-11-03 NOTE — Interval H&P Note (Signed)
History and Physical Interval Note:  11/03/2015 11:42 AM  Jessica Good  has presented today for surgery, with the diagnosis of PSEUDOGOUT LEFT UNI KNEE  The various methods of treatment have been discussed with the patient and family. After consideration of risks, benefits and other options for treatment, the patient has consented to  Procedure(s): TOTAL KNEE REVISION (Left) as a surgical intervention .  The patient's history has been reviewed, patient examined, no change in status, stable for surgery.  I have reviewed the patient's chart and labs.  Questions were answered to the patient's satisfaction.     Melanye Hiraldo D

## 2015-11-03 NOTE — Transfer of Care (Signed)
Immediate Anesthesia Transfer of Care Note  Patient: Jessica Good  Procedure(s) Performed: Procedure(s): TOTAL KNEE REVISION (Left)  Patient Location: PACU  Anesthesia Type:Regional and Spinal  Level of Consciousness: awake, alert  and oriented  Airway & Oxygen Therapy: Patient Spontanous Breathing and Patient connected to nasal cannula oxygen  Post-op Assessment: Report given to RN and Post -op Vital signs reviewed and stable  Post vital signs: Reviewed and stable  Last Vitals:  Filed Vitals:   11/03/15 1129 11/03/15 1341  BP: 138/72 150/48  Pulse: 81 43  Temp: 36.8 C 36.6 C  Resp: 20 20    Last Pain:  Filed Vitals:   11/03/15 1342  PainSc: 10-Worst pain ever      Patients Stated Pain Goal: 2 ([id number])  Complications: No apparent anesthesia complications

## 2015-11-03 NOTE — OR Nursing (Signed)
Attempted to place BTK TED hose that came with patient to OR on left operative leg per PA's request. TED hose seemed very tight and improperly sized relative to her swelling. Passed this info on to PACU RN.

## 2015-11-03 NOTE — Anesthesia Procedure Notes (Signed)
Anesthesia Regional Block:  Adductor canal block  Pre-Anesthetic Checklist: ,, timeout performed, Correct Patient, Correct Site, Correct Laterality, Correct Procedure, Correct Position, site marked, Risks and benefits discussed,  Surgical consent,  Pre-op evaluation,  At surgeon's request and post-op pain management  Laterality: Left  Prep: chloraprep       Needles:  Injection technique: Single-shot  Needle Type: Echogenic Stimulator Needle     Needle Length: 9cm 9 cm Needle Gauge: 21 and 21 G    Additional Needles:  Procedures: ultrasound guided (picture in chart) Adductor canal block Narrative:  Start time: 11/03/2015 12:40 PM End time: 11/03/2015 12:50 PM Injection made incrementally with aspirations every 5 mL.  Performed by: Personally  Anesthesiologist: Lillia Abed  Additional Notes: Monitors applied. Patient sedated. Sterile prep and drape,hand hygiene and sterile gloves were used. Relevant anatomy identified.Needle position confirmed.Local anesthetic injected incrementally after negative aspiration. Local anesthetic spread visualized around nerve(s). Vascular puncture avoided. No complications. Image printed for medical record.The patient tolerated the procedure well.    Lillia Abed MD

## 2015-11-03 NOTE — Progress Notes (Signed)
Orthopedic Tech Progress Note Patient Details:  Jessica Good [birth year] [id number]  CPM Left Knee CPM Left Knee: On Left Knee Flexion (Degrees): 60 Left Knee Extension (Degrees): 0 Additional Comments: Trapeze bar and foot roll   [place] Pink 11/03/2015, 5:01 PM

## 2015-11-03 NOTE — Anesthesia Postprocedure Evaluation (Signed)
Anesthesia Post Note  Patient: Jessica Good  Procedure(s) Performed: Procedure(s) (LRB): TOTAL KNEE REVISION (Left)  Patient location during evaluation: PACU Anesthesia Type: General Level of consciousness: awake and alert Pain management: pain level controlled Vital Signs Assessment: post-procedure vital signs reviewed and stable Respiratory status: spontaneous breathing, nonlabored ventilation, respiratory function stable and patient connected to nasal cannula oxygen Cardiovascular status: blood pressure returned to baseline and stable Postop Assessment: no signs of nausea or vomiting Anesthetic complications: no    Last Vitals:  Filed Vitals:   11/03/15 1341 11/03/15 1700  BP: 150/48 140/88  Pulse: 43   Temp: 36.6 C   Resp: 20     Last Pain:  Filed Vitals:   11/03/15 1704  PainSc: 6                  Jeray Shugart A

## 2015-11-03 NOTE — Discharge Instructions (Signed)

## 2015-11-03 NOTE — Anesthesia Preprocedure Evaluation (Signed)
Anesthesia Evaluation  Patient identified by MRN, date of birth, ID band Patient awake    Reviewed: Allergy & Precautions, NPO status , Patient's Chart, lab work & pertinent test results  Airway Mallampati: III  TM Distance: >3 FB Neck ROM: Full    Dental  (+)    Pulmonary    breath sounds clear to auscultation       Cardiovascular hypertension, Pt. on medications  Rhythm:Regular Rate:Normal     Neuro/Psych Anxiety    GI/Hepatic GERD  ,  Endo/Other    Renal/GU      Musculoskeletal   Abdominal (+) + obese,  Abdomen: soft.    Peds  Hematology   Anesthesia Other Findings   Reproductive/Obstetrics                             Anesthesia Physical Anesthesia Plan  ASA: III  Anesthesia Plan:    Post-op Pain Management:    Induction:   Airway Management Planned:   Additional Equipment:   Intra-op Plan:   Post-operative Plan:   Informed Consent:   Dental advisory given  Plan Discussed with: Anesthesiologist and Surgeon  Anesthesia Plan Comments:         Anesthesia Quick Evaluation

## 2015-11-04 DIAGNOSIS — B9689 Other specified bacterial agents as the cause of diseases classified elsewhere: Secondary | ICD-10-CM

## 2015-11-04 DIAGNOSIS — Y838 Other surgical procedures as the cause of abnormal reaction of the patient, or of later complication, without mention of misadventure at the time of the procedure: Secondary | ICD-10-CM

## 2015-11-04 DIAGNOSIS — T8454XD Infection and inflammatory reaction due to internal left knee prosthesis, subsequent encounter: Secondary | ICD-10-CM

## 2015-11-04 DIAGNOSIS — M00862 Arthritis due to other bacteria, left knee: Secondary | ICD-10-CM

## 2015-11-04 DIAGNOSIS — D62 Acute posthemorrhagic anemia: Secondary | ICD-10-CM

## 2015-11-04 LAB — CBC
HEMATOCRIT: 31.6 % — AB (ref 36.0–46.0)
Hemoglobin: 9.9 g/dL — ABNORMAL LOW (ref 12.0–15.0)
MCH: 28.4 pg (ref 26.0–34.0)
MCHC: 31.3 g/dL (ref 30.0–36.0)
MCV: 90.5 fL (ref 78.0–100.0)
Platelets: 269 10*3/uL (ref 150–400)
RBC: 3.49 MIL/uL — ABNORMAL LOW (ref 3.87–5.11)
RDW: 13.6 % (ref 11.5–15.5)
WBC: 5.4 10*3/uL (ref 4.0–10.5)

## 2015-11-04 LAB — BASIC METABOLIC PANEL
ANION GAP: 6 (ref 5–15)
BUN: 8 mg/dL (ref 6–20)
CALCIUM: 8.4 mg/dL — AB (ref 8.9–10.3)
CO2: 27 mmol/L (ref 22–32)
Chloride: 103 mmol/L (ref 101–111)
Creatinine, Ser: 0.87 mg/dL (ref 0.44–1.00)
GFR calc Af Amer: >60 mL/min (ref >60)
GFR calc non Af Amer: >60 mL/min (ref >60)
GLUCOSE: 128 mg/dL — AB (ref 65–99)
POTASSIUM: 3.7 mmol/L (ref 3.5–5.1)
Sodium: 136 mmol/L (ref 135–145)

## 2015-11-04 LAB — GLUCOSE, CAPILLARY
GLUCOSE-CAPILLARY: 180 mg/dL — AB (ref 65–99)
GLUCOSE-CAPILLARY: 153 mg/dL — AB (ref 65–99)
Glucose-Capillary: 130 mg/dL — ABNORMAL HIGH (ref 65–99)
Glucose-Capillary: 151 mg/dL — ABNORMAL HIGH (ref 65–99)
Glucose-Capillary: 164 mg/dL — ABNORMAL HIGH (ref 65–99)

## 2015-11-04 MED ORDER — DEXTROSE 5 % IV SOLN
2.0000 g | Freq: Three times a day (TID) | INTRAVENOUS | Status: DC
  Administered 2015-11-04 – 2015-11-06 (×6): 2 g via INTRAVENOUS
  Filled 2015-11-04 (×7): qty 2

## 2015-11-04 NOTE — Consult Note (Signed)
[facility]  Total days of antibiotics 2        Day 2 cefazolin               Reason for Consult:PJI    Referring Physician: murphy  Principal Problem:   Primary localized osteoarthritis of left knee Active Problems:   Constipation due to opioid therapy   Gastroesophageal reflux disease without esophagitis   Arthritis of knee   Abnormality of gait   Morbid obesity (HCC)   Pseudogout of knee   Postoperative anemia due to acute blood loss    HPI: Jessica Good is a 65 y.o. 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 She reports that for the last 3 montsh having worsening pain to left knee where she was undergonig medical management for pseudogout. During this time period, she reports having intermittent chills, subjective fevers, but also in the setting of having been treated for uti x 2. She was admitted for evaluation of her left knee possibly redo vs. Total knee arthroplasty. Her work up included having aspiration confirms crystals consistent with pseudogout. CT arthrogram. shows joint space on this lateral side, but it has perhaps narrowed some - Unable to assess the meniscus, as the dye does not appear to be pooled around within the joint.There is no active infection. On 6/26 she underwent old HW removal and TKA implantation, in a one staged revision. Her OR tissue cx from 6/26 are showing rare gnr, and reincubating for identification. ID consulted for abtx recs. She is afebrile thus far, with tmax of 100.0 no leukocytosis. She reports taking cipro for uti a few weeks ago.  Past Medical History  Diagnosis Date  . Hypertension   . Anxiety   . Osteoarthritis   . Constipation due to pain medication   . Urgency of urination   . Edema of lower extremity     Takes lasix PRN  . Hiatal hernia   . IBS (irritable bowel syndrome)   . Cataract     . Hyperlipidemia   . Osteoporosis     osteoarthritis  . Sleep apnea     never dx  . Diabetes mellitus without complication ([place])     boarderline no meds  . GERD (gastroesophageal reflux disease)     with meds  . Adenomatous colon polyp   . Gallstones   . Gastritis   . Pseudogout   . Anemia     PMH  . Loose, teeth     in the front of mouth    Allergies:  Allergies  Allergen Reactions  . Demerol [Meperidine] Other (See Comments)    Makes her "crazy" / caused excessive sleepiness  . Latex Rash    Pt. Stated she has a " latex allergy" and it "gives her a rash".  . Sulfa Antibiotics Other (See Comments)    : Flu-like symptoms  . Adhesive [Tape] Rash    Reaction to bandaids     MEDICATIONS: . aspirin EC  325 mg Oral Q breakfast  . docusate sodium  100 mg Oral BID  . insulin aspart  0-24 Units Subcutaneous Q4H  . ketorolac  7.5 mg Intravenous Q6H  . lisinopril  20 mg Oral Daily    Social History  Substance Use Topics  . Smoking status: Never Smoker   . Smokeless tobacco: Never Used  . Alcohol Use: No  Family History  Problem Relation Age of Onset  . Diabetes Maternal Grandmother   . Breast cancer Neg Hx   . Celiac disease Neg Hx   . Cirrhosis Neg Hx   . Clotting disorder Neg Hx   . Colitis Neg Hx   . Colon cancer Neg Hx   . Colon polyps Neg Hx   . Crohn's disease Neg Hx   . Cystic fibrosis Neg Hx   . Esophageal cancer Neg Hx   . Hemochromatosis Neg Hx   . Inflammatory bowel disease Neg Hx   . Irritable bowel syndrome Neg Hx   . Kidney disease Neg Hx   . Liver cancer Neg Hx   . Liver disease Neg Hx   . Ovarian cancer Neg Hx   . Pancreatic cancer Mother     head of pancreas removed   . Prostate cancer Neg Hx   . Rectal cancer Neg Hx   . Stomach cancer Neg Hx   . Ulcerative colitis Neg Hx   . Uterine cancer Neg Hx   . Wilson's disease Neg Hx   . Hypertension Neg Hx   . Diabetes Father   . Heart disease Father     Review of Systems   Constitutional: +subjective chills/fever. Negative for fever, chills, diaphoresis, activity change, appetite change, fatigue and unexpected weight change.  HENT: Negative for congestion, sore throat, rhinorrhea, sneezing, trouble swallowing and sinus pressure.  Eyes: Negative for photophobia and visual disturbance.  Respiratory: Negative for cough, chest tightness, shortness of breath, wheezing and stridor.  Cardiovascular: Negative for chest pain, palpitations and leg swelling.  Gastrointestinal: Negative for nausea, vomiting, abdominal pain, diarrhea, constipation, blood in stool, abdominal distention and anal bleeding.  Genitourinary: Negative for dysuria, hematuria, flank pain and difficulty urinating.  Musculoskeletal: + left knee pain. Negative for myalgias, back pain, joint swelling, arthralgias and gait problem.  Skin: Negative for color change, pallor, rash and wound.  Neurological: Negative for dizziness, tremors, weakness and light-headedness.  Hematological: Negative for adenopathy. Does not bruise/bleed easily.  Psychiatric/Behavioral: Negative for behavioral problems, confusion, sleep disturbance, dysphoric mood, decreased concentration and agitation.     OBJECTIVE: Temp:  [98.6 F (37 C)-100 F (37.8 C)] 100 F (37.8 C) (06/27 1500) Pulse Rate:  [84-116] 89 (06/27 1500) BP: (108-169)/(33-95) 127/66 mmHg (06/27 1500) SpO2:  [89 %-100 %] 89 % (06/27 1500) Physical Exam  Constitutional:  oriented to person, place, and time. appears well-developed and well-nourished. No distress.  HENT: [place]/AT, PERRLA, no scleral icterus Mouth/Throat: Oropharynx is clear and moist. No oropharyngeal exudate.  Cardiovascular: Normal rate, regular rhythm and normal heart sounds. Exam reveals no gallop and no friction rub.  No murmur heard.  Pulmonary/Chest: Effort normal and breath sounds normal. No respiratory distress.  has no wheezes.  Neck = supple, no nuchal rigidity Abdominal: Soft.  Bowel sounds are normal.  exhibits no distension. There is no tenderness.  Lymphadenopathy: no cervical adenopathy. No axillary adenopathy Neurological: alert and oriented to person, place, and time.  Ext: left knee has surgical bandage, midline. No surrounding erythema  Skin: Skin is warm and dry. No rash noted. No erythema.  Psychiatric: a normal mood and affect.  behavior is normal.    LABS: Results for orders placed or performed during the hospital encounter of 11/03/15 (from the past 48 hour(s))  Glucose, capillary     Status: Abnormal   Collection Time: 11/03/15 11:36 AM  Result Value Ref Range   Glucose-Capillary 136 (H) 65 - 99 mg/dL  Aerobic/Anaerobic Culture (surgical/deep wound)     Status: None (Preliminary result)   Collection Time: 11/03/15  2:22 PM  Result Value Ref Range   Specimen Description TISSUE SYNOVIAL LEFT KNEE    Special Requests PT ON ANCEF    Gram Stain      ABUNDANT WBC PRESENT, PREDOMINANTLY MONONUCLEAR RARE GRAM NEGATIVE RODS    Culture CULTURE REINCUBATED FOR BETTER GROWTH    Report Status PENDING   Glucose, capillary     Status: None   Collection Time: 11/03/15  5:01 PM  Result Value Ref Range   Glucose-Capillary 88 65 - 99 mg/dL  Glucose, capillary     Status: Abnormal   Collection Time: 11/03/15  9:17 PM  Result Value Ref Range   Glucose-Capillary 128 (H) 65 - 99 mg/dL  Glucose, capillary     Status: Abnormal   Collection Time: 11/04/15 12:30 AM  Result Value Ref Range   Glucose-Capillary 151 (H) 65 - 99 mg/dL  Glucose, capillary     Status: Abnormal   Collection Time: 11/04/15  4:35 AM  Result Value Ref Range   Glucose-Capillary 164 (H) 65 - 99 mg/dL  CBC     Status: Abnormal   Collection Time: 11/04/15  5:50 AM  Result Value Ref Range   WBC 5.4 4.0 - 10.5 K/uL   RBC 3.49 (L) 3.87 - 5.11 MIL/uL   Hemoglobin 9.9 (L) 12.0 - 15.0 g/dL   HCT 31.6 (L) 36.0 - 46.0 %   MCV 90.5 78.0 - 100.0 fL   MCH 28.4 26.0 - 34.0 pg   MCHC 31.3 30.0 -  36.0 g/dL   RDW 13.6 11.5 - 15.5 %   Platelets 269 150 - 400 K/uL  Basic metabolic panel     Status: Abnormal   Collection Time: 11/04/15  5:50 AM  Result Value Ref Range   Sodium 136 135 - 145 mmol/L   Potassium 3.7 3.5 - 5.1 mmol/L   Chloride 103 101 - 111 mmol/L   CO2 27 22 - 32 mmol/L   Glucose, Bld 128 (H) 65 - 99 mg/dL   BUN 8 6 - 20 mg/dL   Creatinine, Ser 0.87 0.44 - 1.00 mg/dL   Calcium 8.4 (L) 8.9 - 10.3 mg/dL   GFR calc non Af Amer >60 >60 mL/min   GFR calc Af Amer >60 >60 mL/min    Comment: (NOTE) The eGFR has been calculated using the CKD EPI equation. This calculation has not been validated in all clinical situations. eGFR's persistently <60 mL/min signify possible Chronic Kidney Disease.    Anion gap 6 5 - 15  Glucose, capillary     Status: Abnormal   Collection Time: 11/04/15 11:37 AM  Result Value Ref Range   Glucose-Capillary 180 (H) 65 - 99 mg/dL    MICRO: 6/26 tissue cx + GNR on gram stain, growth is pending for ID IMAGING: Dg Knee 1-2 Views Left  11/03/2015  CLINICAL DATA:  Left arthroplasty.  Postop check. EXAM: LEFT KNEE - 1-2 VIEW COMPARISON:  None. FINDINGS: Status post total knee arthroplasty. Hardware appears intact and appropriately positioned. Expected postsurgical changes within the surrounding soft tissues. IMPRESSION: Status post total knee arthroplasty. No evidence of surgical complicating feature. Electronically Signed   By: Franki Cabot M.D.   On: 11/03/2015 19:54   Assessment/Plan:  65yo F with PJI s/p 1 staged revision from unicompartmental knee arthroplasty to TKA. Tissue cultures showing possible GNR infection. ? That it could have been seeded when she  had recent UTI. Plan to switch abtx to cefepime for now and await for identification on culture. Will check sed rate and crp. If she pathogen is sensitive to FQ, potentially can give FQ instead of IV therapy since they have good tissue penetration.  - will check hiv and hep c for health  maintenance  - will provide further recs as culture results return  Nyree Yonker B. [facility] [phone]

## 2015-11-04 NOTE — Progress Notes (Signed)
ANTIBIOTIC CONSULT NOTE - INITIAL  Pharmacy Consult for cefepime Indication: PJI  Allergies  Allergen Reactions  . Demerol [Meperidine] Other (See Comments)    Makes her "crazy" / caused excessive sleepiness  . Latex Rash    Pt. Stated she has a " latex allergy" and it "gives her a rash".  . Sulfa Antibiotics Other (See Comments)    : Flu-like symptoms  . Adhesive [Tape] Rash    Reaction to bandaids    Patient Measurements: Height: 5\' 1"  (154.9 cm) Weight: 242 lb (109.77 kg) IBW/kg (Calculated) : 47.8 Adjusted Body Weight:   Vital Signs: Temp: 100 F (37.8 C) (06/27 1500) Temp Source: Oral (06/27 1500) BP: 127/66 mmHg (06/27 1500) Pulse Rate: 89 (06/27 1500) Intake/Output from previous day: 06/26 0701 - 06/27 0700 In: 2000 [I.V.:2000] Out: 700 [Urine:600; Blood:100] Intake/Output from this shift: Total I/O In: 1080 [P.O.:1080] Out: -   Labs:  Recent Labs  11/04/15 0550  WBC 5.4  HGB 9.9*  PLT 269  CREATININE 0.87   Estimated Creatinine Clearance: 73.9 mL/min (by C-G formula based on Cr of 0.87). No results for input(s): VANCOTROUGH, VANCOPEAK, VANCORANDOM, GENTTROUGH, GENTPEAK, GENTRANDOM, TOBRATROUGH, TOBRAPEAK, TOBRARND, AMIKACINPEAK, AMIKACINTROU, AMIKACIN in the last 72 hours.   Microbiology: Recent Results (from the past 720 hour(s))  Surgical pcr screen     Status: None   Collection Time: 10/27/15 10:06 AM  Result Value Ref Range Status   MRSA, PCR NEGATIVE NEGATIVE Final   Staphylococcus aureus NEGATIVE NEGATIVE Final    Comment:        The Xpert SA Assay (FDA approved for NASAL specimens in patients over 3 years of age), is one component of a comprehensive surveillance program.  Test performance has been validated by [facility] for patients greater than or equal to 58 year old. It is not intended to diagnose infection nor to guide or monitor treatment.   Aerobic/Anaerobic Culture (surgical/deep wound)     Status: None (Preliminary  result)   Collection Time: 11/03/15  2:22 PM  Result Value Ref Range Status   Specimen Description TISSUE SYNOVIAL LEFT KNEE  Final   Special Requests PT ON ANCEF  Final   Gram Stain   Final    ABUNDANT WBC PRESENT, PREDOMINANTLY MONONUCLEAR RARE GRAM NEGATIVE RODS    Culture CULTURE REINCUBATED FOR BETTER GROWTH  Final   Report Status PENDING  Incomplete    Medical History: Past Medical History  Diagnosis Date  . Hypertension   . Anxiety   . Osteoarthritis   . Constipation due to pain medication   . Urgency of urination   . Edema of lower extremity     Takes lasix PRN  . Hiatal hernia   . IBS (irritable bowel syndrome)   . Cataract   . Hyperlipidemia   . Osteoporosis     osteoarthritis  . Sleep apnea     never dx  . Diabetes mellitus without complication ([place])     boarderline no meds  . GERD (gastroesophageal reflux disease)     with meds  . Adenomatous colon polyp   . Gallstones   . Gastritis   . Pseudogout   . Anemia     PMH  . Loose, teeth     in the front of mouth    Medications:  Scheduled:  . aspirin EC  325 mg Oral Q breakfast  . docusate sodium  100 mg Oral BID  . insulin aspart  0-24 Units Subcutaneous Q4H  . lisinopril  20 mg Oral Daily   Infusions:  . lactated ringers 100 mL/hr at 11/04/15 0847   Assessment: 65 yo female with PJI will be started on cefepime.  CrCl ~74  Goal of Therapy:  - resolution of infection  Plan:  - cefepime 2g iv q8h   Olinda Nola, Tsz-Yin 11/04/2015,6:06 PM

## 2015-11-04 NOTE — Progress Notes (Signed)
Orthopedic Tech Progress Note Patient Details:  Jessica Good Sep 20, 1950 [id number]  Patient ID: Jessica Good, female   DOB: 1950/11/26, 65 y.o.   MRN: [id number] Applied cpm 0-60  [name] 11/04/2015, 6:53 AM

## 2015-11-04 NOTE — Progress Notes (Signed)
Physical Therapy Treatment Patient Details Name: Jessica Good MRN: [id number] DOB: April 17, 1951 Today's Date: 11/04/2015    History of Present Illness 65 y/o WF s/p L total knee revision (11/03/15) due to pseudo gout in L unicompartmental knee.    PT Comments    Pt able to ambulate outside room in hallway in 2nd session today.  Pt able to ambulate with good sequence with RW.  At end of session, pt positioned with "bone foam" and was going to see how long she could tolerate.  Positioned with ice pack to A with pain. Recommend home with HHPT and husband support.  Follow Up Recommendations  Home health PT;Supervision for mobility/OOB     Equipment Recommendations  None recommended by PT    Recommendations for Other Services       Precautions / Restrictions Precautions Precautions: Knee Restrictions Weight Bearing Restrictions: Yes LLE Weight Bearing: Weight bearing as tolerated    Mobility  Bed Mobility Overal bed mobility: Needs Assistance Bed Mobility: Sit to Supine     Supine to sit: HOB elevated;Min guard Sit to supine: Min assist   General bed mobility comments: A for L LE  Transfers Overall transfer level: Needs assistance Equipment used: Rolling walker (2 wheeled) Transfers: Sit to/from Stand Sit to Stand: Min guard         General transfer comment: cues for technique  Ambulation/Gait Ambulation/Gait assistance: Min guard Ambulation Distance (Feet): 60 Feet Assistive device: Rolling walker (2 wheeled) Gait Pattern/deviations: Step-to pattern;Antalgic     General Gait Details: Ambulated with min/guard and recliner follow.  Did become lightheaded at end of gait and had to sit, but it subsided with sitting down.   Stairs            Wheelchair Mobility    Modified Rankin (Stroke Patients Only)       Balance Overall balance assessment: History of Falls;Needs assistance   Sitting balance-Leahy Scale: Good     Standing balance support:  Bilateral upper extremity supported Standing balance-Leahy Scale: Poor Standing balance comment: requires UE support                    Cognition Arousal/Alertness: Awake/alert Behavior During Therapy: WFL for tasks assessed/performed Overall Cognitive Status: Within Functional Limits for tasks assessed                      Exercises Total Joint Exercises Ankle Circles/Pumps: AROM;Both;10 reps Quad Sets: Strengthening;Left;5 reps Towel Squeeze: Both;10 reps Heel Slides: AAROM;Left;5 reps Hip ABduction/ADduction: AAROM;Left;10 reps;Supine Goniometric ROM: -10 to 83    General Comments General comments (skin integrity, edema, etc.): husband present      Pertinent Vitals/Pain Pain Assessment: 0-10 Pain Score: 7  Pain Location: L knee Pain Descriptors / Indicators: Grimacing;Sore;Operative site guarding Pain Intervention(s): Limited activity within patient's tolerance;Monitored during session;Repositioned;Ice applied    Home Living Family/patient expects to be discharged to:: Private residence Living Arrangements: Spouse/significant other   Type of Home: House Home Access: Ramped entrance   [place]: Two level;Able to live on main level with bedroom/bathroom Home Equipment: Wheelchair - [employer] - 2 wheels;Bedside commode      Prior Function Level of Independence: Needs assistance  Gait / Transfers Assistance Needed: Has not been able to walk in 3 months due to pain.  SPT only on R LE, not using RW with transfer       PT Goals (current goals can now be found in the care plan section) Acute  Rehab PT Goals Patient Stated Goal: go home with husband and HHPT PT Goal Formulation: With patient/family Time For Goal Achievement: 11/11/15 Potential to Achieve Goals: Good Progress towards PT goals: Progressing toward goals    Frequency  7X/week    PT Plan Current plan remains appropriate    Co-evaluation             End of Session Equipment  Utilized During Treatment: Gait belt;Left knee immobilizer (no orders for KI, but in room, so used with gait) Activity Tolerance: Patient tolerated treatment well Patient left: in bed;with call bell/phone within reach;with family/visitor present;Other (comment) (positioned in bone foam)     Time: [id number] PT Time Calculation (min) (ACUTE ONLY): 25 min  Charges:  $Gait Training: 8-22 mins $Therapeutic Exercise: 8-22 mins $Therapeutic Activity: 8-22 mins                    G Codes:      Icela Glymph LUBECK 11/04/2015, 1:57 PM

## 2015-11-04 NOTE — Op Note (Signed)
11/03/2015  10:55 AM  PATIENT:  Jessica Good    PRE-OPERATIVE DIAGNOSIS:  PSEUDOGOUT LEFT UNI KNEE  POST-OPERATIVE DIAGNOSIS:  Same  PROCEDURE:  TOTAL KNEE REVISION  SURGEON:  MURPHY, Ernesta Amble, MD  ASSISTANT: Roxan Hockey, PA-C, She was present and scrubbed throughout the case, critical for completion in a timely fashion, and for retraction, instrumentation, and closure.   ANESTHESIA:   spinal  PREOPERATIVE INDICATIONS:  Jessica Good is a  65 y.o. female with a diagnosis of PSEUDOGOUT LEFT UNI KNEE who failed conservative measures and elected for surgical management.    The risks benefits and alternatives were discussed with the patient preoperatively including but not limited to the risks of infection, bleeding, nerve injury, cardiopulmonary complications, the need for revision surgery, among others, and the patient was willing to proceed.  OPERATIVE IMPLANTS: biomet TKA, no augment needed  OPERATIVE FINDINGS: osteochondral injury to the lateral fem chondyle, well fixed implant.   BLOOD LOSS: min  COMPLICATIONS: none  TOURNIQUET TIME: roughly 133min  OPERATIVE PROCEDURE:  Patient was identified in the preoperative holding area and site was marked by me She was transported to the operating theater and placed on the table in supine position taking care to pad all bony prominences. After a preincinduction time out anesthesia was induced. The left extremity was prepped and draped in normal sterile fashion and a pre-incision timeout was performed. She received ancef for preoperative antibiotics.   Amended anterior incision dissected down to her capsule. The capsule it healed well. I then made a medial capsulotomy the vastus split. I examined her knee there is no frank purulence there was some fibrous tissue around the notch and some synovitis throughout the knee. I examined her lateral compartment she had good cartilage on the distal femur however with a few degrees of  flexion her tibia engaged where she actually had an osteochondral injury on her distal femur is engaged in about 20 to degrees of flexion. Unclear as to the mechanism that caused this injury.  Given her failure of a implant I did culture some synovial. I performed an extensive synovectomy of all inflamed synovium.  R motor polyethylene component  I then used an osteotome to remove her femoral implant became out taking most of the cement relating very little bone defect.  Next I used an osteotome to remove her tibial component it also came out removing all cemented with it and leaving very little bone defect limit initial cuts.  I then surveyed intramedullary guide and made a distal femur cut I replaced the tibiofemoral component to assess the height of this. I then removed that component made my distal femur cut. Set the rotation using [place] line in her intercondylar access and made my chamfer cuts and anterior and posterior cuts postoperative days.  I then turned my attention to the tibia using extramedullary guide to create a tibial cut the previous tibial cut was just right about at the right height anyway so with this new tibial cut we had a good remaining amount of tibia without the need to augment.  I finished prepping the tibia then placed trial components and was happy with the tracking instead of all component. I performed a patellar surface resssection using a solid guide and was happy with the patellar resection and placed a patellar trial. A separate the tracking of her knee.  She had good extension.  I then removed these components I completed the tibial prep. I then made sure I removed  all cement all inflamed synovium and been removed a performed a thorough irrigation with 3 L of saline and pulse lavage.  I then inserted the previously trialed implants with a good cement mantle around each.  I inserted the polyethylene component and the locking bolt.  I cemented the patella  component allow time for the cement to harden. Examination Extension good tracking. I then thoroughly irrigated her wound again posterior capsule watertight closure followed by the skin in layers. She is woken taken to the PACU in stable condition  POST OPERATIVE PLAN: Weightbearing as tolerated aspirin for DVT prophylaxis I watch her current cultures closely    This note was generated using a template and dragon dictation system. In light of that, I have reviewed the note and all aspects of it are applicable to this case. Any dictation errors are due to the computerized dictation system.

## 2015-11-04 NOTE — Evaluation (Signed)
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  Clinical Impression  Pt admitted with above diagnosis. Pt currently with functional limitations due to the deficits listed below (see PT Problem List).  Pt will benefit from skilled PT to increase their independence and safety with mobility to allow discharge to the venue listed below.  Pt has been w/c bound for last several months and had a large amount of fear with getting up.  She was able to ambulate 10' x 2 with close min/guard to occasional MIN A. Pt began crying during ambulation due to happiness of being able to walk.  Husband present and supportive and feel that home is a safe d/c plan with HHPT.  Discussed use of gait belt and how he could purchase one in gift shop.  Pt may require min/guard for a bit, just due to her fear of falling.     Follow Up Recommendations Home health PT;Supervision for mobility/OOB    Equipment Recommendations  None recommended by PT    Recommendations for Other Services       Precautions / Restrictions Precautions Precautions: Knee Restrictions Weight Bearing Restrictions: Yes LLE Weight Bearing: Weight bearing as tolerated      Mobility  Bed Mobility Overal bed mobility: Needs Assistance Bed Mobility: Supine to Sit     Supine to sit: HOB elevated;Min guard     General bed mobility comments: Pt able to come to EOB without physical A, but with heavy use of rail and bed elevated slightly  Transfers Overall transfer level: Needs assistance Equipment used: Rolling walker (2 wheeled) Transfers: Sit to/from Stand Sit to Stand: Min guard         General transfer comment: close min/guard for all transfers due to pt's fear. Transferred off of bed, recliner, and  BSC  Ambulation/Gait Ambulation/Gait assistance: Min guard;Min assist Ambulation Distance (Feet): 10 Feet (x2) Assistive device: Rolling walker (2 wheeled) Gait Pattern/deviations: Step-to pattern;Antalgic     General Gait Details: Pt ambulating with close MIN/guard with cues for sequencing.  Increased A needed when turning to sit onto surface and needs A with RW placement and proper sequencing.     Stairs            Wheelchair Mobility    Modified Rankin (Stroke Patients Only)       Balance Overall balance assessment: History of Falls;Needs assistance   Sitting balance-Leahy Scale: Good     Standing balance support: Bilateral upper extremity supported   Standing balance comment: requires UE support                             Pertinent Vitals/Pain Pain Assessment: 0-10 Pain Score: 5  Pain Location: L knee Pain Descriptors / Indicators: Operative site guarding;Aching;Sore    Home Living Family/patient expects to be discharged to:: Private residence Living Arrangements: Spouse/significant other   Type of Home: House Home Access: [place]: Two level;Able to live on main level with bedroom/bathroom Home Equipment: Wheelchair - [employer] - 2 wheels;Bedside commode      Prior Function Level of Independence: Needs assistance   Gait / Transfers Assistance Needed: Has not been able to walk in 3 months due to pain.  SPT only on R LE,  not using RW with transfer           Hand Dominance   Dominant Hand: Right    Extremity/Trunk Assessment   Upper Extremity Assessment: Defer to OT evaluation           Lower Extremity Assessment: LLE deficits/detail   LLE Deficits / Details: L knee AROM ~ 50 with fair quad set     Communication   Communication: No difficulties  Cognition Arousal/Alertness: Awake/alert Behavior During Therapy: WFL for tasks assessed/performed Overall Cognitive Status: Within Functional  Limits for tasks assessed                      General Comments General comments (skin integrity, edema, etc.): Husband present and supportive    Exercises Total Joint Exercises Ankle Circles/Pumps: AROM;Both;10 reps Quad Sets: Strengthening;Left;5 reps Heel Slides: AAROM;Left;10 reps      Assessment/Plan    PT Assessment Patient needs continued PT services  PT Diagnosis Difficulty walking   PT Problem List Decreased strength;Decreased range of motion;Decreased activity tolerance;Decreased balance;Decreased mobility;Decreased knowledge of use of DME;Pain  PT Treatment Interventions DME instruction;Gait training;Functional mobility training;Therapeutic activities;Therapeutic exercise;Balance training   PT Goals (Current goals can be found in the Care Plan section) Acute Rehab PT Goals Patient Stated Goal: go home with husband and HHPT PT Goal Formulation: With patient/family Time For Goal Achievement: 11/11/15 Potential to Achieve Goals: Good    Frequency 7X/week   Barriers to discharge        Co-evaluation               End of Session Equipment Utilized During Treatment: Gait belt;Left knee immobilizer (no orders for KI, but was in room, so wore for gait) Activity Tolerance: Patient tolerated treatment well Patient left: in chair;with call bell/phone within reach;with family/visitor present Nurse Communication: Mobility status (Nurse Tech)         Time: [id number] (no charge for toileting time or obtaining BSC)-1051 PT Time Calculation (min) (ACUTE ONLY): 54 min   Charges:   PT Evaluation $PT Eval Moderate Complexity: 1 Procedure PT Treatments $Therapeutic Exercise: 8-22 mins $Therapeutic Activity: 8-22 mins   PT G Codes:        Zuhayr Deeney LUBECK 11/04/2015, 11:33 AM

## 2015-11-04 NOTE — Care Management Important Message (Signed)
Important Message  Patient Details  Name: Jessica Good MRN: [id number] Date of Birth: [birth year]   Medicare Important Message Given:  Yes    Loann Quill 11/04/2015, 8:13 AM

## 2015-11-04 NOTE — Progress Notes (Signed)
   Assessment/Plan: 1 Day Post-Op  S/P Procedure(s) (LRB): TOTAL KNEE REVISION (Left)  by Dr. Ernesta Amble. Percell Miller on 11/03/15  Principal Problem:   Primary localized osteoarthritis of left knee Active Problems:   Constipation due to opioid therapy   Gastroesophageal reflux disease without esophagitis   Arthritis of knee   Abnormality of gait   Morbid obesity (HCC)   Pseudogout of knee   Postoperative anemia due to acute blood loss - likely with dilutional component.    PLAN: Advance diet Up with therapy Discharge home with home health - Please contact if SNF recommended based on PT evaluation.   Weight Bearing: Weight Bearing as Tolerated (WBAT)  Dressings: prn VTE prophylaxis: Aspirin Dispo: Home pending PT Evaluation  Subjective: Patient reports pain as moderate and controlled w/ PO and IV medicine.  Tolerating liquids.  Not oob yet.  No CP, SOB.  Objective:   VITALS:   Filed Vitals:   11/03/15 1947 11/03/15 2031 11/04/15 0029 11/04/15 0430  BP: 137/65 119/88 108/33 116/51  Pulse: 93 116 105 94  Temp:  98.6 F (37 C) 98.8 F (37.1 C) 98.8 F (37.1 C)  TempSrc:   Oral Oral  Resp:      Height:      Weight:      SpO2: 100% 100% 95% 94%    Lab Results  Component Value Date   WBC 5.4 11/04/2015   HGB 9.9* 11/04/2015   HCT 31.6* 11/04/2015   MCV 90.5 11/04/2015   PLT 269 11/04/2015   BMET    Component Value Date/Time   NA 136 11/04/2015 0550   K 3.7 11/04/2015 0550   CL 103 11/04/2015 0550   CO2 27 11/04/2015 0550   GLUCOSE 128* 11/04/2015 0550   BUN 8 11/04/2015 0550   CREATININE 0.87 11/04/2015 0550   CALCIUM 8.4* 11/04/2015 0550   GFRNONAA >60 11/04/2015 0550   GFRAA >60 11/04/2015 0550    Physical Exam General: NAD.  Supine in bed Resp: clear to auscultation bilaterally Cardio: regular rate and rhythm ABD soft  Neurovascular intact Sensation intact distally Intact pulses distally Dorsiflexion/Plantar flexion intact Incision: dressing  C/D/I   Prudencio Burly III 11/04/2015, 8:07 AM

## 2015-11-05 ENCOUNTER — Encounter (HOSPITAL_COMMUNITY): Payer: Self-pay | Admitting: Orthopedic Surgery

## 2015-11-05 LAB — CBC
HEMATOCRIT: 32.3 % — AB (ref 36.0–46.0)
HEMOGLOBIN: 10.3 g/dL — AB (ref 12.0–15.0)
MCH: 29.1 pg (ref 26.0–34.0)
MCHC: 31.9 g/dL (ref 30.0–36.0)
MCV: 91.2 fL (ref 78.0–100.0)
Platelets: 264 10*3/uL (ref 150–400)
RBC: 3.54 MIL/uL — AB (ref 3.87–5.11)
RDW: 13.7 % (ref 11.5–15.5)
WBC: 5.1 10*3/uL (ref 4.0–10.5)

## 2015-11-05 LAB — GLUCOSE, CAPILLARY
GLUCOSE-CAPILLARY: 161 mg/dL — AB (ref 65–99)
GLUCOSE-CAPILLARY: 111 mg/dL — AB (ref 65–99)
GLUCOSE-CAPILLARY: 148 mg/dL — AB (ref 65–99)
GLUCOSE-CAPILLARY: 145 mg/dL — AB (ref 65–99)
Glucose-Capillary: 105 mg/dL — ABNORMAL HIGH (ref 65–99)
Glucose-Capillary: 141 mg/dL — ABNORMAL HIGH (ref 65–99)

## 2015-11-05 NOTE — Progress Notes (Signed)
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    PT Comments    Pt performed decreased gait from previous session.  Pt guarded and required increased time during intervention.  Will f/u this pm to progress mobility.    Follow Up Recommendations  Home health PT;Supervision/Assistance - 24 hour     Equipment Recommendations  None recommended by PT    Recommendations for Other Services       Precautions / Restrictions Precautions Precautions: Knee Precaution Booklet Issued: Yes (comment) Precaution Comments: reviewed positioning Restrictions Weight Bearing Restrictions: Yes LLE Weight Bearing: Weight bearing as tolerated    Mobility  Bed Mobility Overal bed mobility: Needs Assistance Bed Mobility: Supine to Sit     Supine to sit: HOB elevated;Min assist     General bed mobility comments: Pt required assistance with LLE but able to scoot and elevate upper trunk unassisted.  Assist required secondary to pain.    Transfers Overall transfer level: Needs assistance Equipment used: Rolling walker (2 wheeled) Transfers: Sit to/from Stand Sit to Stand: Min guard         General transfer comment: Cues for technique.  Pt required cues for hand placement and upper trunk control.  Pt remains to move slowly and guard LLE.    Ambulation/Gait Ambulation/Gait assistance: Min guard Ambulation Distance (Feet): 44 Feet Assistive device: Rolling walker (2 wheeled) Gait Pattern/deviations: Step-to pattern;Antalgic;Decreased stride length;Decreased stance time - left;Decreased step length - right     General Gait Details: Pt remains to require close chair follow.  pt guarded and limited secondary to pain.  Pt required cues for sequencing and upper trunk control.  Encouraged pt to weight  shift L and increased step length on R.     Stairs            Wheelchair Mobility    Modified Rankin (Stroke Patients Only)       Balance Overall balance assessment: History of Falls   Sitting balance-Leahy Scale: Good       Standing balance-Leahy Scale: Poor                      Cognition Arousal/Alertness: Awake/alert Behavior During Therapy: WFL for tasks assessed/performed Overall Cognitive Status: Within Functional Limits for tasks assessed                      Exercises Total Joint Exercises Ankle Circles/Pumps: AROM;Both;10 reps;Supine Quad Sets: Strengthening;Left;10 reps;Supine Towel Squeeze: AROM;Both;10 reps;Supine Short Arc Quad: AAROM;10 reps;Supine;Left Heel Slides: AAROM;10 reps;Supine;Left Hip ABduction/ADduction: AROM;Left Straight Leg Raises: AAROM;Left;10 reps;Supine Goniometric ROM: 6-83.      General Comments        Pertinent Vitals/Pain Pain Assessment: 0-10 Pain Score: 9  Pain Location: L knee Pain Descriptors / Indicators: Grimacing;Guarding;Sore;Operative site guarding Pain Intervention(s): Monitored during session;Repositioned;Ice applied    Home Living                      Prior Function            PT Goals (current goals can now be found in the care plan section) Acute Rehab PT Goals Patient Stated Goal: go home with husband and HHPT Potential to Achieve Goals: Good Progress towards PT goals: Progressing toward goals  Frequency  7X/week    PT Plan Current plan remains appropriate    Co-evaluation             End of Session Equipment Utilized During Treatment: Gait belt   Patient left: in bed;with call bell/phone within reach;with family/visitor present     Time: [id number] PT Time Calculation (min) (ACUTE ONLY): 31 min  Charges:  $Gait Training: 8-22 mins $Therapeutic Exercise: 8-22 mins                    G Codes:      Cristela Blue 11/16/15, 4:25 PM  Governor Rooks, PTA pager [phone]

## 2015-11-05 NOTE — Progress Notes (Signed)
    [facility] for Infectious Disease    Date of Admission:  11/03/2015   Total days of antibiotics 3        Day 2 cefepime           ID: Jessica Good is a 65 y.o. 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 Slept well last night  Medications:  . aspirin EC  325 mg Oral Q breakfast  . ceFEPime (MAXIPIME) IV  2 g Intravenous Q8H  . docusate sodium  100 mg Oral BID  . insulin aspart  0-24 Units Subcutaneous Q4H  . lisinopril  20 mg Oral Daily    Objective: Vital signs in last 24 hours: Temp:  [98.6 F (37 C)-100 F (37.8 C)] 98.6 F (37 C) (06/28 0448) Pulse Rate:  [87-89] 87 (06/28 0448) Resp:  [16] 16 (06/28 0448) BP: (111-129)/(57-76) 111/57 mmHg (06/28 0448) SpO2:  [89 %-99 %] 99 % (06/28 0448)  Physical Exam  Constitutional:  oriented to person, place, and time. appears well-developed and well-nourished. In mild distress.  HENT: [place]/AT, PERRLA, no scleral icterus Mouth/Throat: Oropharynx is clear and moist. No oropharyngeal exudate.  Cardiovascular: Normal rate, regular rhythm and normal heart sounds. Exam reveals no gallop and no friction rub.  No murmur heard.  Pulmonary/Chest: Effort normal and breath sounds normal. No respiratory distress.  has no wheezes.  Neck = supple, no nuchal rigidity Abdominal: Soft. Bowel sounds are normal.  exhibits no distension. There is no tenderness.  Lymphadenopathy: no cervical adenopathy. No axillary adenopathy Neurological: alert and oriented to person, place, and time.  Skin: Skin is warm and dry. No rash noted. No erythema.    Ext: left leg is in PT mobilizer, no surrounding erythema to incision  Lab Results  Recent Labs  11/04/15 0550 11/05/15 0727  WBC 5.4 5.1  HGB 9.9* 10.3*  HCT 31.6* 32.3*  NA 136  --   K 3.7  --   CL 103  --   CO2 27  --   BUN 8  --   CREATININE 0.87  --    No results found for: Micheline Rough  Microbiology: 6/26 OR culture - GNR Studies/Results: Dg Knee 1-2 Views Left  11/03/2015  CLINICAL DATA:  Left arthroplasty.  Postop check. EXAM: LEFT KNEE - 1-2 VIEW COMPARISON:  None. FINDINGS: Status post total knee arthroplasty. Hardware appears intact and appropriately positioned. Expected postsurgical changes within the surrounding soft tissues. IMPRESSION: Status post total knee arthroplasty. No evidence of surgical complicating feature. Electronically Signed   By: Franki Cabot M.D.   On: 11/03/2015 19:54     Assessment/Plan: GNR prosthetic joint infection = continue with cefepime. Await GNR identification to see whether can treat with orals vs.requiring picc line. Will check sed rate and crp today  Knee pain = defer to primary team for management  Baxter Flattery [facility] for Infectious Diseases Cell: [phone] Pager: [phone]  11/05/2015, 1:58 PM

## 2015-11-05 NOTE — Progress Notes (Signed)
   Assessment/Plan: 2 Days Post-Op  S/P Procedure(s) (LRB): TOTAL KNEE REVISION (Left)  by Dr. Ernesta Amble. Percell Miller on 11/03/15  Principal Problem:   Primary localized osteoarthritis of left knee Active Problems:   Constipation due to opioid therapy   Gastroesophageal reflux disease without esophagitis   Arthritis of knee   Abnormality of gait   Morbid obesity (HCC)   Pseudogout of knee   Postoperative anemia due to acute blood loss    PLAN: Advance diet Up with therapy Continue Antibiotics - Tissue cultures showing possible GNR infection  May be able to transition to PO - ID following.  Weight Bearing: Weight Bearing as Tolerated (WBAT)  Dressings: prn VTE prophylaxis: Aspirin Dispo: Home  Pending ABX   Subjective: Patient reports pain as moderate and controlled w/ PO medicine.  Tolerating diet.   No CP, SOB.  OOB walking w/ PT.  Objective:   VITALS:   Filed Vitals:   11/04/15 1500 11/04/15 1932 11/04/15 2130 11/05/15 0448  BP: 127/66  129/76 111/57  Pulse: 89   87  Temp: 100 F (37.8 C) 99.8 F (37.7 C) 99.5 F (37.5 C) 98.6 F (37 C)  TempSrc: Oral  Oral Oral  Resp:   16 16  Height:      Weight:      SpO2: 89%  94% 99%    Physical Exam General: NAD.  Supine in bed Resp: clear to auscultation bilaterally Cardio: regular rate and rhythm ABD soft  Neurovascular intact Sensation intact distally Intact pulses distally Dorsiflexion/Plantar flexion intact Incision: dressing C/D/I   Jessica Good 11/05/2015, 6:45 AM

## 2015-11-05 NOTE — Progress Notes (Signed)
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    PT Comments    Pt performed increased gait distance, less pain noted this pm.  Pt remains to require encouragement and motivation during activity.    Follow Up Recommendations  Home health PT;Supervision/Assistance - 24 hour     Equipment Recommendations  None recommended by PT    Recommendations for Other Services       Precautions / Restrictions Precautions Precautions: Knee Precaution Booklet Issued: Yes (comment) Precaution Comments: reviewed positioning Restrictions Weight Bearing Restrictions: Yes LLE Weight Bearing: Weight bearing as tolerated    Mobility  Bed Mobility Overal bed mobility: Needs Assistance Bed Mobility: Supine to Sit;Sit to Supine     Supine to sit: Min assist;HOB elevated Sit to supine: Min assist   General bed mobility comments: Pt required assistance with LLE but able to scoot and elevate upper trunk unassisted.  Assist required secondary to pain.  Sit to supine assist provided for LLE against gravity.    Transfers Overall transfer level: Needs assistance Equipment used: Rolling walker (2 wheeled) Transfers: Sit to/from Stand Sit to Stand: Supervision         General transfer comment: Cues for technique.  Pt required cues for hand placement and upper trunk control.  Pt remains to move slowly and guard LLE.    Ambulation/Gait Ambulation/Gait assistance: Min guard Ambulation Distance (Feet): 82 Feet (required x1 standing break (lengthy rest break).  ) Assistive device: Rolling walker (2 wheeled) Gait Pattern/deviations: Step-to pattern;Decreased stance time - left;Decreased step length - right;Antalgic;Trunk flexed     General Gait Details: Pt performed increased gait distance with max  cues to advance gait distance and encourage activity.  Pt remains to require cues for weight shifting to L to improve gait quality.     Stairs            Wheelchair Mobility    Modified Rankin (Stroke Patients Only)       Balance Overall balance assessment: History of Falls   Sitting balance-Leahy Scale: Good       Standing balance-Leahy Scale: Poor                      Cognition Arousal/Alertness: Awake/alert Behavior During Therapy: WFL for tasks assessed/performed Overall Cognitive Status: Within Functional Limits for tasks assessed                      Exercises Total Joint Exercises Ankle Circles/Pumps: AROM;Both;10 reps;Supine Quad Sets: Strengthening;Left;10 reps;Supine Towel Squeeze: AROM;Both;10 reps;Supine Short Arc Quad: AAROM;10 reps;Supine;Left Heel Slides: AAROM;10 reps;Supine;Left Hip ABduction/ADduction: AROM;Left Straight Leg Raises: AAROM;Left;10 reps;Supine Goniometric ROM: 6-83.      General Comments        Pertinent Vitals/Pain Pain Assessment: 0-10 Pain Score: 4  Pain Location: L knee Pain Descriptors / Indicators: Grimacing;Guarding;Sore;Operative site guarding Pain Intervention(s): Monitored during session;Repositioned;Ice applied    Home Living                      Prior Function            PT Goals (current goals can now be found in the care plan section) Acute Rehab PT Goals Patient Stated Goal: go home with husband and HHPT Potential to Achieve Goals: Good  Progress towards PT goals: Progressing toward goals    Frequency  7X/week    PT Plan Current plan remains appropriate    Co-evaluation             End of Session Equipment Utilized During Treatment: Gait belt Activity Tolerance: Patient tolerated treatment well Patient left: in bed;with call bell/phone within reach;with family/visitor present     Time: [id number] PT Time Calculation (min) (ACUTE ONLY): 23 min  Charges:   $Gait Training: 8-22 mins $Therapeutic Exercise: 8-22 mins                    G Codes:      Cristela Blue 11-19-15, 4:31 PM Governor Rooks, PTA pager [phone]

## 2015-11-05 NOTE — Progress Notes (Signed)
Orthopedic Tech Progress Note Patient Details:  Jessica Good 06/05/1950 [id number]  Patient ID: [name], female   DOB: 1951/03/17, 65 y.o.   MRN: [id number] Pt refused cpm. Will call when ready  [name] 11/05/2015, 6:30 AM

## 2015-11-05 NOTE — Evaluation (Signed)
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   Clinical Impression   Pt was using a w/c as primary means of mobility since April and assisted for LB bathing and dressing and ADL transfers and all IADL due to severe L knee pain. She was in 10/10 pain at the time of this evaluation. Will plan to follow acutely. Pt has excellent support of her husband.    Follow Up Recommendations  No OT follow up;Supervision/Assistance - 24 hour    Equipment Recommendations  None recommended by OT    Recommendations for Other Services       Precautions / Restrictions Precautions Precautions: Knee;Fall Precaution Booklet Issued: Yes (comment) Precaution Comments: reviewed positioning Restrictions Weight Bearing Restrictions: Yes LLE Weight Bearing: Weight bearing as tolerated      Mobility Bed Mobility    General bed mobility comments: pt in chair  Transfers Overall transfer level: Needs assistance Equipment used: Rolling walker (2 wheeled) Transfers: Sit to/from Stand Sit to Stand: Min guard         General transfer comment: increased time and verbal cues for technique, increased guarding    Balance Overall balance assessment: History of Falls   Sitting balance-Leahy Scale: Good       Standing balance-Leahy Scale: Poor                              ADL Overall ADL's : Needs assistance/impaired Eating/Feeding: Independent;Sitting   Grooming: Wash/dry hands;Wash/dry face;Sitting;Set up   Upper Body Bathing: Set up;Sitting   Lower Body Bathing: Sit to/from stand;Total assistance   Upper Body Dressing : Set up;Sitting   Lower Body Dressing: Total assistance;Sit to/from stand                 General ADL Comments: Pt with heavy reliance on UEs in  standing to avoid WB on L LE.     Vision     Perception     Praxis      Pertinent Vitals/Pain Pain Assessment: Faces Pain Score: 10 Faces Pain Scale: Hurts worst Pain Location: L knee Pain Descriptors / Indicators: Aching;Grimacing;Guarding;Crying Pain Intervention(s): Limited activity within patient's tolerance;Monitored during session;Repositioned;Premedicated before session     Hand Dominance Right   Extremity/Trunk Assessment Upper Extremity Assessment Upper Extremity Assessment: Overall WFL for tasks assessed   Lower Extremity Assessment Lower Extremity Assessment: Defer to PT evaluation       Communication Communication Communication: No difficulties   Cognition Arousal/Alertness: Awake/alert Behavior During Therapy: WFL for tasks assessed/performed Overall Cognitive Status: Within Functional Limits for tasks assessed                     General Comments       Exercises       Shoulder Instructions      Home Living Family/patient expects to be discharged to:: Private residence Living Arrangements: Spouse/significant other Available Help at Discharge: Family;Available 24 hours/day Type of Home: House Home Access: Ramped entrance     Home Layout: Two level;Able to live on main level with bedroom/bathroom     Bathroom Shower/Tub: Occupational psychologist: Handicapped height     Home Equipment: Wheelchair - [employer] - 2 wheels;Bedside commode;Grab bars - tub/shower;Hand held shower head;Adaptive equipment  Adaptive Equipment: Reacher;Sock aid;Long-handled shoe horn;Long-handled sponge        Prior Functioning/Environment Level of Independence: Needs assistance  Gait / Transfers Assistance Needed: Has not been able to walk in 3 months due to pain.  SPT only on R LE, not using RW with transfer ADL's / Homemaking Assistance Needed: assisted for LB bathing and dressing, ADL transfers and all IADL since April         OT  Diagnosis: Generalized weakness;Acute pain   OT Problem List: Decreased strength;Decreased activity tolerance;Impaired balance (sitting and/or standing);Decreased knowledge of use of DME or AE;Pain;Obesity;Decreased range of motion   OT Treatment/Interventions: Self-care/ADL training;DME and/or AE instruction;Patient/family education;Balance training;Therapeutic activities    OT Goals(Current goals can be found in the care plan section) Acute Rehab OT Goals Patient Stated Goal: go home with husband and HHPT OT Goal Formulation: With patient Time For Goal Achievement: 11/12/15 Potential to Achieve Goals: Good ADL Goals Pt Will Perform Grooming: with supervision;standing Pt Will Transfer to Toilet: with supervision;ambulating;bedside commode Pt Will Perform Toileting - Clothing Manipulation and hygiene: with supervision;sit to/from stand Pt Will Perform Tub/Shower Transfer: Shower transfer;with min guard assist;ambulating;grab bars;rolling walker;shower seat  OT Frequency: Min 2X/week   Barriers to D/C:            Co-evaluation              End of Session Equipment Utilized During Treatment: Gait belt;Rolling walker CPM Left Knee Additional Comments: Pt removed from CPM on arrival.   Activity Tolerance: Patient limited by pain Patient left: in chair;with call bell/phone within reach;with family/visitor present   Time: 1134-1205 OT Time Calculation (min): 31 min Charges:  OT General Charges $OT Visit: 1 Procedure OT Evaluation $OT Eval Moderate Complexity: 1 Procedure G-Codes:    Malka So 11/05/2015, 4:41 PM  [phone]

## 2015-11-05 NOTE — Progress Notes (Signed)
222Orthopedic Tech Progress Note Patient Details:  Jessica Good [birth year] [id number] Ortho visit put on cpm at [place] Patient ID: [name], female   DOB: 10/09/1950, 65 y.o.   MRN: [id number]   [name] 11/05/2015, 6:48 PM

## 2015-11-06 DIAGNOSIS — B954 Other streptococcus as the cause of diseases classified elsewhere: Secondary | ICD-10-CM

## 2015-11-06 LAB — HIV ANTIBODY (ROUTINE TESTING W REFLEX): HIV Screen 4th Generation wRfx: NONREACTIVE

## 2015-11-06 LAB — GLUCOSE, CAPILLARY
GLUCOSE-CAPILLARY: 123 mg/dL — AB (ref 65–99)
GLUCOSE-CAPILLARY: 173 mg/dL — AB (ref 65–99)
Glucose-Capillary: 103 mg/dL — ABNORMAL HIGH (ref 65–99)
Glucose-Capillary: 112 mg/dL — ABNORMAL HIGH (ref 65–99)
Glucose-Capillary: 131 mg/dL — ABNORMAL HIGH (ref 65–99)
Glucose-Capillary: 135 mg/dL — ABNORMAL HIGH (ref 65–99)

## 2015-11-06 LAB — C-REACTIVE PROTEIN: CRP: 21.2 mg/dL — ABNORMAL HIGH (ref <1.0)

## 2015-11-06 LAB — SEDIMENTATION RATE: SED RATE: 84 mm/h — AB (ref 0–22)

## 2015-11-06 MED ORDER — DEXTROSE 5 % IV SOLN
2.0000 g | INTRAVENOUS | Status: DC
  Administered 2015-11-06 – 2015-11-07 (×2): 2 g via INTRAVENOUS
  Filled 2015-11-06 (×2): qty 2

## 2015-11-06 NOTE — Care Management Important Message (Signed)
Important Message  Patient Details  Name: Jessica Good MRN: [id number] Date of Birth: 30-Jun-1950   Medicare Important Message Given:  Yes    Loann Quill 11/06/2015, 9:58 AM

## 2015-11-06 NOTE — Progress Notes (Signed)
[facility]  Patient Status:  Active pt with [facility]  [name] is providing the following services: [name] will provide home health care services for pt. [facility] will partner with [name] to provide IV Rocephin at home as ordered at DC. We will follow pt with [facility] until DC to ensure home Infusion needs are met.   If patient discharges after hours, please call [phone].   Larry Sierras 11/06/2015, 5:31 PM

## 2015-11-06 NOTE — Progress Notes (Signed)
   Assessment/Plan: 3 Days Post-Op  S/P Procedure(s) (LRB): TOTAL KNEE REVISION (Left)  by Dr. Ernesta Amble. Percell Miller on 11/03/15  Principal Problem:   Primary localized osteoarthritis of left knee Active Problems:   Constipation due to opioid therapy   Gastroesophageal reflux disease without esophagitis   Arthritis of knee   Abnormality of gait   Morbid obesity (HCC)   Pseudogout of knee   Postoperative anemia due to acute blood loss    PLAN: Up with therapy Continue Antibiotics - Tissue cultures showing few GNR   May need PICC or be able to transition to PO - ID following.  Weight Bearing: Weight Bearing as Tolerated (WBAT)  Dressings: prn VTE prophylaxis: Aspirin Dispo: Home  Pending ABX treatment plan  Subjective: Patient reports pain as decreasing and controlled w/ PO medicine.  Tolerating diet.   No CP, SOB.  OOB walking w/ PT.  Objective:   VITALS:   Filed Vitals:   11/05/15 0448 11/05/15 1500 11/05/15 2010 11/06/15 0558  BP: 111/57 120/68 116/47 120/59  Pulse: 87 88 117 96  Temp: 98.6 F (37 C) 98.5 F (36.9 C) 99.6 F (37.6 C) 98.3 F (36.8 C)  TempSrc: Oral Oral Oral Oral  Resp: 16 18 17 16   Height:      Weight:      SpO2: 99% 99% 100% 97%    Physical Exam General: NAD.  Supine in bed Resp: No increased wob Cardio: regular rate and rhythm MSK: Neurovascular intact Sensation intact distally Intact pulses distally Dorsiflexion/Plantar flexion intact Incision: dressing C/D/I   Jessica Good 11/06/2015, 6:12 AM

## 2015-11-06 NOTE — Progress Notes (Signed)
[facility] for Infectious Disease    Date of Admission:  11/03/2015   Total days of antibiotics 4        Day 3 cefepime           ID: Jessica Good is a 65 y.o. 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 Slept well last night  Medications:  . aspirin EC  325 mg Oral Q breakfast  . cefTRIAXone (ROCEPHIN)  IV  2 g Intravenous Q24H  . docusate sodium  100 mg Oral BID  . insulin aspart  0-24 Units Subcutaneous Q4H  . lisinopril  20 mg Oral Daily    Objective: Vital signs in last 24 hours: Temp:  [98.3 F (36.8 C)-99.8 F (37.7 C)] 99.8 F (37.7 C) (06/29 1500) Pulse Rate:  [84-117] 84 (06/29 1500) Resp:  [16-17] 16 (06/29 1500) BP: (111-120)/(47-94) 111/94 mmHg (06/29 1500) SpO2:  [97 %-100 %] 100 % (06/29 1500)  Physical Exam  Constitutional:  oriented to person, place, and time. appears well-developed and well-nourished. In mild distress.  HENT: [place]/AT, PERRLA, no scleral icterus Mouth/Throat: Oropharynx is clear and moist. No oropharyngeal exudate.  Cardiovascular: Normal rate, regular rhythm and normal heart sounds. Exam reveals no gallop and no friction rub.  No murmur heard.  Pulmonary/Chest: Effort normal and breath sounds normal. No respiratory distress.  has no wheezes.  Neck = supple, no nuchal rigidity Abdominal: Soft. Bowel sounds are normal.  exhibits no distension. There is no tenderness.  Lymphadenopathy: no cervical adenopathy. No axillary adenopathy Neurological: alert and oriented to person, place, and time.  Skin: Skin is warm and dry. No  rash noted. No erythema.  Ext: incisional bandage in place, no surrounding erythema to incision  Lab Results  Recent Labs  11/04/15 0550 11/05/15 0727  WBC 5.4 5.1  HGB 9.9* 10.3*  HCT 31.6* 32.3*  NA 136  --   K 3.7  --   CL 103  --   CO2 27  --   BUN 8  --   CREATININE 0.87  --    Lab Results  Component Value Date   ESRSEDRATE 84* 11/06/2015    Microbiology: 6/26 OR culture - strep viridans group Studies/Results: No results found.   Assessment/Plan: Strep viridans prosthetic joint infection = will change abtx to ceftriaxone 2gm iv daily to tx for 6 wk using 6/27 as day 1. Will get picc line and have [facility] administer abtx delivery  Knee pain = defer to primary team for management  Will sign off, and have follow up in 4-6 wk  --------------------------Home health order listed below-----------------------------  Diagnosis: pji  Culture Result: viridans strep  Allergies  Allergen Reactions  . Demerol [Meperidine] Other (See Comments)    Makes her "crazy" / caused excessive sleepiness  . Latex Rash    Pt. Stated she has a " latex allergy" and it "gives her a rash".  . Sulfa Antibiotics Other (See Comments)    : Flu-like symptoms  . Adhesive [Tape] Rash    Reaction to bandaids  Discharge antibiotics: Per pharmacy protocol  Ceftriaxone 2gm iv daily  Duration: 6 wk End Date: Aug 8th  [facility] Care Per Protocol:  Labs weekly while on IV antibiotics: _x_ CBC with differential _x_ CMP _x_ CRP _x_ ESR   Fax weekly labs to [phone]  Clinic Follow Up Appt: In 4-6 wk  '@dr'  Urbano Milhouse    [facility] for Infectious Diseases Cell: [phone] Pager: [phone]  11/06/2015, 4:21 PM

## 2015-11-06 NOTE — Progress Notes (Signed)
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    PT Comments    Pt performed seated activities and reports increased pain with knee flexion.  Pt tearful during session and required words of encouragement.  Pt able to advance gait distance.  Will f/u this pm to continue PT intervention per POC.    Follow Up Recommendations  Home health PT;Supervision/Assistance - 24 hour     Equipment Recommendations  None recommended by PT    Recommendations for Other Services       Precautions / Restrictions Precautions Precautions: Knee;Fall Precaution Booklet Issued: Yes (comment) Precaution Comments: reviewed positioning (Pt tearful when re-educated to keep foam under L ankle to improve knee extension.  Pt reports," Nobody knows how bad I hurt.") Restrictions Weight Bearing Restrictions: Yes LLE Weight Bearing: Weight bearing as tolerated    Mobility  Bed Mobility               General bed mobility comments: pt in chair on arrival.    Transfers Overall transfer level: Needs assistance Equipment used: Rolling walker (2 wheeled) Transfers: Sit to/from Stand Sit to Stand: Min guard         General transfer comment: increased time and verbal cues for technique, increased guarding  Ambulation/Gait Ambulation/Gait assistance: Min guard Ambulation Distance (Feet): 120 Feet Assistive device: Rolling walker (2 wheeled) Gait Pattern/deviations: Step-to pattern;Decreased stride length;Antalgic;Trunk flexed;Decreased stance time - left;Decreased step length - right     General Gait Details: Pt performed increased gait distance with max cues to advance gait distance and encourage activity.  Pt remains to require cues for weight shifting to L to improve gait quality.  Cues for forward  gaze and upper trunk control.     Stairs            Wheelchair Mobility    Modified Rankin (Stroke Patients Only)       Balance Overall balance assessment: History of Falls   Sitting balance-Leahy Scale: Good       Standing balance-Leahy Scale: Poor                      Cognition Arousal/Alertness: Awake/alert Behavior During Therapy: WFL for tasks assessed/performed Overall Cognitive Status: Within Functional Limits for tasks assessed                      Exercises Total Joint Exercises Long Arc QuadSinclair Ship;Left;10 reps Knee Flexion: AROM;AAROM;Left;10 reps (1x5 AAROM with 10 sec hold and 1x5 reps AROM with 10 sec hold.  Pt required cues for pacing and using pain as guide.  ) Goniometric ROM: 6-75. Other Exercises Other Exercises: Pt required increased time to complete seated flexion activities.  Pt became tearful during seated activities.  Pt motivated but seems to have a poor tolerance for pain.  This requires more rest breaks between sets.      General Comments        Pertinent Vitals/Pain Pain Assessment: 0-10 Pain Score: 10-Worst pain ever Pain Location: L knee. L posterior/lateral hip Pain Descriptors / Indicators: Aching;Guarding;Grimacing;Crying;Throbbing;Tightness Pain Intervention(s): Monitored during session;Repositioned;Ice applied    Home Living                      Prior Function  PT Goals (current goals can now be found in the care plan section) Acute Rehab PT Goals Patient Stated Goal: go home with husband and HHPT Potential to Achieve Goals: Good Progress towards PT goals: Progressing toward goals    Frequency  7X/week    PT Plan Current plan remains appropriate    Co-evaluation             End of Session Equipment Utilized During Treatment: Gait belt Activity Tolerance: Patient tolerated treatment well Patient left: with call bell/phone within reach;with family/visitor present;in  chair     Time: [id number] PT Time Calculation (min) (ACUTE ONLY): 34 min  Charges:  $Gait Training: 8-22 mins $Therapeutic Activity: 8-22 mins                    G Codes:      Cristela Blue 23-Nov-2015, 12:50 PM  Governor Rooks, PTA pager [phone]

## 2015-11-06 NOTE — Progress Notes (Signed)
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    PT Comments    Pt performed decreased gait distance as pt requesting to limit gait to control pain.  Will f/u in am in prep for d/c.    Follow Up Recommendations  Home health PT;Supervision/Assistance - 24 hour     Equipment Recommendations  None recommended by PT    Recommendations for Other Services       Precautions / Restrictions Precautions Precautions: Knee;Fall Precaution Booklet Issued: Yes (comment) Precaution Comments: reviewed positioning Restrictions Weight Bearing Restrictions: Yes LLE Weight Bearing: Weight bearing as tolerated    Mobility  Bed Mobility Overal bed mobility: Needs Assistance Bed Mobility: Supine to Sit     Supine to sit: Min assist Sit to supine: Min assist   General bed mobility comments: Pt required assist to and from bed.  Pt assisted with LLE and educated on using strap for assistance from self.    Transfers Overall transfer level: Needs assistance Equipment used: Rolling walker (2 wheeled) Transfers: Sit to/from Stand Sit to Stand: Min guard         General transfer comment: increased time and verbal cues for technique, increased guarding  Ambulation/Gait Ambulation/Gait assistance: Min guard Ambulation Distance (Feet): 80 Feet Assistive device: Rolling walker (2 wheeled) Gait Pattern/deviations: Step-through pattern;Step-to pattern;Antalgic;Trunk flexed;Decreased weight shift to left;Decreased step length - right     General Gait Details: Pt performed increased gait distance with max cues to advance gait distance and encourage activity.  Pt remains to require cues for weight shifting to L to improve gait quality.  Cues for forward gaze and upper trunk control.     Stairs            Wheelchair Mobility    Modified Rankin (Stroke Patients Only)       Balance Overall balance assessment: Needs assistance   Sitting balance-Leahy Scale: Good       Standing balance-Leahy Scale: Poor                      Cognition Arousal/Alertness: Awake/alert Behavior During Therapy: WFL for tasks assessed/performed Overall Cognitive Status: Within Functional Limits for tasks assessed                      Exercises Deferred therapeutic exercise due to MD entering to discuss tx options with PICC line.      General Comments        Pertinent Vitals/Pain Pain Assessment: 0-10 Pain Score: 5  Pain Location: L knee/ thigh Pain Descriptors / Indicators: Grimacing;Guarding;Aching Pain Intervention(s): Monitored during session;Repositioned    Home Living                      Prior Function            PT Goals (current goals can now be found in the care plan section) Acute Rehab PT Goals Patient Stated Goal: go home with husband and HHPT Potential to Achieve Goals: Good Progress towards PT goals: Progressing toward goals    Frequency  7X/week    PT Plan Current plan remains appropriate    Co-evaluation             End of Session Equipment Utilized During Treatment: Gait belt Activity  Tolerance: Patient tolerated treatment well Patient left: with call bell/phone within reach;with family/visitor present;in chair     Time: [id number] PT Time Calculation (min) (ACUTE ONLY): 24 min  Charges:  $Gait Training: 8-22 mins $Therapeutic Activity: 8-22 mins                Governor Rooks, PTA pager [phone]    G Codes:      Cristela Blue 2015-11-30, 4:20 PM Governor Rooks, PTA pager [phone]

## 2015-11-06 NOTE — Progress Notes (Signed)
Orthopedic Tech Progress Note Patient Details:  Jessica Good January 07, 1951 [id number]  Patient ID: Jessica Good, female   DOB: [birth year], 65 y.o.   MRN: [id number] Applied cpm 0-55  [name] 11/06/2015, 6:00 AM

## 2015-11-07 DIAGNOSIS — M25562 Pain in left knee: Secondary | ICD-10-CM | POA: Insufficient documentation

## 2015-11-07 DIAGNOSIS — T8450XA Infection and inflammatory reaction due to unspecified internal joint prosthesis, initial encounter: Secondary | ICD-10-CM | POA: Insufficient documentation

## 2015-11-07 DIAGNOSIS — Z9889 Other specified postprocedural states: Secondary | ICD-10-CM | POA: Insufficient documentation

## 2015-11-07 LAB — GLUCOSE, CAPILLARY
GLUCOSE-CAPILLARY: 122 mg/dL — AB (ref 65–99)
Glucose-Capillary: 120 mg/dL — ABNORMAL HIGH (ref 65–99)
Glucose-Capillary: 137 mg/dL — ABNORMAL HIGH (ref 65–99)

## 2015-11-07 LAB — HEPATITIS C ANTIBODY

## 2015-11-07 MED ORDER — SODIUM CHLORIDE 0.9% FLUSH: 10.0000 mL | Freq: Two times a day (BID) | INTRAVENOUS | Status: DC

## 2015-11-07 MED ORDER — SODIUM CHLORIDE 0.9% FLUSH: 10.0000 mL | INTRAVENOUS | Status: DC | PRN

## 2015-11-07 MED ORDER — DEXTROSE 5 % IV SOLN: 2.0000 g | INTRAVENOUS | Status: AC

## 2015-11-07 NOTE — Progress Notes (Signed)
Peripherally Inserted Central Catheter/Midline Placement  The IV Nurse has discussed with the patient and/or persons authorized to consent for the patient, the purpose of this procedure and the potential benefits and risks involved with this procedure.  The benefits include less needle sticks, lab draws from the catheter and patient may be discharged home with the catheter.  Risks include, but not limited to, infection, bleeding, blood clot (thrombus formation), and puncture of an artery; nerve damage and irregular heat beat.  Alternatives to this procedure were also discussed.  PICC/Midline Placement Documentation  PICC Single Lumen [id number] PICC Right Basilic 42 cm 0 cm (Active)  Indication for Insertion or Continuance of Line Home intravenous therapies (PICC only) 11/07/2015 11:13 AM  Exposed Catheter (cm) 0 cm 11/07/2015 11:13 AM  Site Assessment Clean;Dry;Intact 11/07/2015 11:13 AM  Line Status Flushed;Saline locked;Blood return noted 11/07/2015 11:13 AM  Dressing Type Transparent 11/07/2015 11:13 AM  Dressing Status Clean;Dry;Intact 11/07/2015 11:13 AM  Dressing Change Due 11/14/15 11/07/2015 11:13 AM       [facility] Payment 11/07/2015, 11:14 AM

## 2015-11-07 NOTE — Progress Notes (Signed)
Second call out in attempt to obtain order for [facility] [facility] to follow pt in home for IV meds/PICC line.

## 2015-11-07 NOTE — Progress Notes (Signed)
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    PT Comments    Pt presents with decreased pain and improved endurance/motivation.  Will f/u this pm to address stair training.    Follow Up Recommendations  Home health PT;Supervision/Assistance - 24 hour     Equipment Recommendations  None recommended by PT    Recommendations for Other Services       Precautions / Restrictions Precautions Precautions: Knee;Fall Precaution Booklet Issued: Yes (comment) Precaution Comments: reviewed positioning Restrictions Weight Bearing Restrictions: Yes LLE Weight Bearing: Weight bearing as tolerated    Mobility  Bed Mobility Overal bed mobility: Needs Assistance Bed Mobility: Supine to Sit     Supine to sit: Min assist Sit to supine: Min assist   General bed mobility comments: Pt required assist to and from bed.  Pt assisted with LLE and educated on using strap for assistance from self.    Transfers Overall transfer level: Needs assistance Equipment used: Rolling walker (2 wheeled) Transfers: Sit to/from Stand Sit to Stand: Supervision         General transfer comment: Pt performed with good technique, cues provided for safety.    Ambulation/Gait Ambulation/Gait assistance: Min guard Ambulation Distance (Feet): 124 Feet Assistive device: Rolling walker (2 wheeled) Gait Pattern/deviations: Step-to pattern;Antalgic;Decreased stance time - left;Trunk flexed;Step-through pattern     General Gait Details: Pt presents with improved endurance, c/o mild lightheadedness during last 20 ft of  gait training.  Required cues for L heel strike.  Pt progressed from step to, to step through.     Stairs Stairs:  (will address stairs this pm.  )          Wheelchair Mobility     Modified Rankin (Stroke Patients Only)       Balance Overall balance assessment: Needs assistance   Sitting balance-Leahy Scale: Good       Standing balance-Leahy Scale: Poor                      Cognition Arousal/Alertness: Awake/alert Behavior During Therapy: WFL for tasks assessed/performed Overall Cognitive Status: Within Functional Limits for tasks assessed                      Exercises Total Joint Exercises Ankle Circles/Pumps: AROM;Both;10 reps;Supine Quad Sets: Strengthening;Left;10 reps;Supine Towel Squeeze: AROM;Both;10 reps;Supine Short Arc Quad: AAROM;10 reps;Supine;Left Heel Slides: AAROM;10 reps;Supine;Left Hip ABduction/ADduction: AROM;Left;10 reps;Supine Straight Leg Raises: AAROM;Left;10 reps;Supine    General Comments        Pertinent Vitals/Pain Pain Assessment: 0-10 Pain Score: 6  Pain Location: L knee/ thigh Pain Descriptors / Indicators: Grimacing;Guarding;Aching Pain Intervention(s): Monitored during session;Repositioned    Home Living                      Prior Function            PT Goals (current goals can now be found in the care plan section) Acute Rehab PT Goals Patient Stated Goal: go home with husband and HHPT Potential to Achieve Goals: Good Progress towards PT goals: Progressing toward goals    Frequency  7X/week    PT Plan Current plan remains appropriate    Co-evaluation             End  of Session Equipment Utilized During Treatment: Gait belt Activity Tolerance: Patient tolerated treatment well Patient left: with call bell/phone within reach;with family/visitor present;in chair     Time: [id number] PT Time Calculation (min) (ACUTE ONLY): 33 min  Charges:  $Gait Training: 8-22 mins $Therapeutic Exercise: 8-22 mins                    G Codes:      Cristela Blue 11-25-15, 12:59 PM Governor Rooks, PTA pager [phone]

## 2015-11-07 NOTE — Care Management Note (Addendum)
Case Management Note  Patient Details  Name: Jessica Good MRN: [id number] Date of Birth: [birth year]  Subjective/Objective:   Pt admitted on 11/03/15 with pseudo gout LT knee s/p TKR on 11/04/15.  PTA, pt resided at home with spouse.                    Action/Plan: Pt for dc home today with spouse and [facility] services.  She will need home IV Rocephin x 6 weeks, with 11/04/15 as day one.  [facility] to provide IV abx therapy; [facility] to provide skilled nursing and physical therapy for home.  Spouse is able and willing to assist with infusions.  Pt and husband state they have all needed DME at home.   Left message for [facility] Martenson, attending MD's PA to enter [facility] order and face to face.     Expected Discharge Date:     11/07/15             Expected Discharge Plan:  [place]  In-House Referral:  NA  Discharge planning Services  CM Consult  Post Acute Care Choice:    Choice offered to:  Patient  DME Arranged:  IV pump/equipment DME Agency:  [facility] (now [facility])  [place] Arranged:  RN, PT, OT [place] Agency:  [facility] (now [facility])  Status of Service:   Completed, will sign off  If discussed at Long Length of Stay Meetings, dates discussed:    Additional Comments:   Reinaldo Raddle, RN, BSN  Trauma/Neuro ICU Case Manager [phone]

## 2015-11-07 NOTE — Discharge Summary (Signed)
Discharge Summary  Patient ID: MERRIE EPLER MRN: [id number] DOB/AGE: 06-21-1950 65 y.o.  Admit date: 11/03/2015 Discharge date: 11/07/2015  Admission Diagnoses:  Primary localized osteoarthritis of left knee  Discharge Diagnoses:  Principal Problem:   Primary localized osteoarthritis of left knee Active Problems:   Constipation due to opioid therapy   Gastroesophageal reflux disease without esophagitis   Arthritis of knee   Abnormality of gait   Morbid obesity (HCC)   Pseudogout of knee   Postoperative anemia due to acute blood loss   Past Medical History  Diagnosis Date  . Hypertension   . Anxiety   . Osteoarthritis   . Constipation due to pain medication   . Urgency of urination   . Edema of lower extremity     Takes lasix PRN  . Hiatal hernia   . IBS (irritable bowel syndrome)   . Cataract   . Hyperlipidemia   . Osteoporosis     osteoarthritis  . Sleep apnea     never dx  . Diabetes mellitus without complication ([place])     boarderline no meds  . GERD (gastroesophageal reflux disease)     with meds  . Adenomatous colon polyp   . Gallstones   . Gastritis   . Pseudogout   . Anemia     PMH  . Loose, teeth     in the front of mouth    Surgeries: Procedure(s): TOTAL KNEE REVISION on 11/03/2015   Consultants (if any):  Infectious Disease  Discharged Condition: Improved  Hospital Course: HEIKE POUNDS is an 65 y.o. female who was admitted 11/03/2015 with a diagnosis of Primary localized osteoarthritis of left knee and went to the operating room on 11/03/2015 and underwent the above named procedures.  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  She was seen by Dr. Baxter Flattery, infectious disease who recommends the following:  Discharge antibiotics: Per pharmacy protocol Ceftriaxone 2gm iv daily  Duration: 6 wk End Date: Aug 8th  [facility] Care Per Protocol:  Labs weekly while on IV antibiotics: _x_  CBC with differential _x_ CMP _x_ CRP _x_ ESR   Fax weekly labs to [phone]  Clinic Follow Up Appt: In 4-6 wk  Baxter Flattery [facility] for Infectious Diseases Cell: [phone] Pager: [phone]  [facility] is providing the following services: [name] will provide home health care services for pt. [facility] will partner with [name] to provide IV Rocephin at home as ordered at DC. We will follow pt with [facility] until DC to ensure home Infusion needs are met.   She was given perioperative antibiotics:  Anti-infectives    Start     Dose/Rate Route Frequency Ordered Stop   11/07/15 0000  cefTRIAXone 2 g in dextrose 5 % 50 mL     2 g 100 mL/hr over 30 Minutes Intravenous Every 24 hours 11/07/15 0614 01/16/16 2359   11/06/15 1330  cefTRIAXone (ROCEPHIN) 2 g in dextrose 5 % 50 mL IVPB     2 g 100 mL/hr over 30 Minutes Intravenous Every 24 hours 11/06/15 1221     11/04/15 1900  ceFEPIme (MAXIPIME) 2 g in dextrose 5 % 50 mL IVPB  Status:  Discontinued     2 g 100 mL/hr over 30 Minutes Intravenous Every 8 hours 11/04/15 1808 11/06/15 1221   11/03/15 2200  ceFAZolin (ANCEF) IVPB 2g/100 mL premix     2 g 200 mL/hr over 30 Minutes Intravenous Every 6  hours 11/03/15 2032 11/04/15 0500   11/03/15 1054  ceFAZolin (ANCEF) IVPB 2g/100 mL premix     2 g 200 mL/hr over 30 Minutes Intravenous On call to O.R. 11/03/15 1054 11/03/15 1310    .  She was given sequential compression devices, early ambulation, and Asprin for DVT prophylaxis.  She benefited maximally from the hospital stay and there were no complications.    Recent vital signs:  Filed Vitals:   11/06/15 1500 11/07/15 0454  BP: 111/94 125/59  Pulse: 84 84  Temp: 99.8 F (37.7 C) 98.4 F (36.9 C)  Resp: 16 16    Recent laboratory studies:  Lab Results  Component Value Date   HGB 10.3* 11/05/2015   HGB 9.9* 11/04/2015   HGB 11.5* 10/27/2015   Lab Results  Component Value Date   WBC 5.1 11/05/2015   PLT 264  11/05/2015   Lab Results  Component Value Date   INR 1.08 10/27/2015   Lab Results  Component Value Date   NA 136 11/04/2015   K 3.7 11/04/2015   CL 103 11/04/2015   CO2 27 11/04/2015   BUN 8 11/04/2015   CREATININE 0.87 11/04/2015   GLUCOSE 128* 11/04/2015    Discharge Medications:     Medication List    STOP taking these medications        cyclobenzaprine 10 MG tablet  Commonly known as:  FLEXERIL     Oxycodone HCl 10 MG Tabs      TAKE these medications        acetaminophen 500 MG tablet  Commonly known as:  TYLENOL  Take 500 mg by mouth every 6 (six) hours as needed for moderate pain.     ALKA SELTZER PLUS PO  Take 1 tablet by mouth daily as needed (heartburn).     aspirin 325 MG tablet  Take 1 tablet (325 mg total) by mouth daily.     aspirin EC 325 MG tablet  Take 1 tablet (325 mg total) by mouth daily.     cefTRIAXone 2 g in dextrose 5 % 50 mL  Inject 2 g into the vein daily.     cholecalciferol 1000 units tablet  Commonly known as:  VITAMIN D  Take 1,000 Units by mouth daily.     diclofenac 75 MG EC tablet  Commonly known as:  VOLTAREN  Take 75 mg by mouth 2 (two) times daily.     furosemide 40 MG tablet  Commonly known as:  LASIX  Take 40 mg by mouth daily as needed for fluid.     lisinopril 20 MG tablet  Commonly known as:  PRINIVIL,ZESTRIL  Take 20 mg by mouth daily.     methocarbamol 500 MG tablet  Commonly known as:  ROBAXIN  Take 1 tablet (500 mg total) by mouth every 6 (six) hours as needed for muscle spasms.     multivitamin with minerals Tabs tablet  Take 1 tablet by mouth daily.     omeprazole 20 MG capsule  Commonly known as:  PRILOSEC  Take 1 capsule (20 mg total) by mouth daily.     ondansetron 4 MG tablet  Commonly known as:  ZOFRAN  Take 1 tablet (4 mg total) by mouth every 8 (eight) hours as needed for nausea or vomiting.     oxyCODONE-acetaminophen 5-325 MG tablet  Commonly known as:  ROXICET  Take 1-2 tablets by  mouth every 4 (four) hours as needed for severe pain.     sucralfate  1 g tablet  Commonly known as:  CARAFATE  Take 1 tablet (1 g total) by mouth 4 (four) times daily -  with meals and at bedtime.     vitamin B-12 500 MCG tablet  Commonly known as:  CYANOCOBALAMIN  Take 500 mcg by mouth daily.     vitamin C 500 MG tablet  Commonly known as:  ASCORBIC ACID  Take 500 mg by mouth daily.        Diagnostic Studies: Dg Knee 1-2 Views Left  November 30, 2015  CLINICAL DATA:  Left arthroplasty.  Postop check. EXAM: LEFT KNEE - 1-2 VIEW COMPARISON:  None. FINDINGS: Status post total knee arthroplasty. Hardware appears intact and appropriately positioned. Expected postsurgical changes within the surrounding soft tissues. IMPRESSION: Status post total knee arthroplasty. No evidence of surgical complicating feature. Electronically Signed   By: Franki Cabot M.D.   On: 30-Nov-2015 19:54   Ct Knee Left W Contrast  10/10/2015  CLINICAL DATA:  Left knee pain. Lateral knee pain. History of medial hemiarthroplasty. EXAM: CT OF THE LEFT KNEE WITH CONTRAST (ARTHROGRAM) TECHNIQUE: Multidetector CT imaging was performed following the standard protocol following intra-articular injection of diluted iodinated contrast. COMPARISON:  None. FINDINGS: Bones/Joint/Cartilage Medial femorotibial compartment hemiarthroplasty with severe beam hardening artifact obscuring the adjacent soft tissue and osseous structures especially along the medial half of the knee joint. Intraarticular contrast within the left knee joint is located in the suprapatellar joint space. There is contrast also present outside the joint space. The lateral meniscus is nondiagnostic secondary to artifact resulting from the hemi arthroplasty. No hardware failure or complication. Severe osteoarthritis of the lateral femorotibial compartment with joint space narrowing and marginal osteophytosis. Severe lateral patellofemoral compartment osteoarthritis with  subchondral cystic changes and joint space narrowing. No fracture or dislocation. Normal alignment. Tendons Intact quadriceps and patellar tendons. Muscles Normal. Soft tissue No fluid collection or hematoma.  No soft tissue mass. IMPRESSION: 1. Medial femorotibial compartment hemiarthroplasty with severe beam hardening artifact obscuring the adjacent soft tissue and osseous structures N making evaluation of the lateral meniscus nondiagnostic. 2. Severe osteoarthritis of the lateral femorotibial compartment and lateral patellofemoral compartment. Electronically Signed   By: Kathreen Devoid   On: 10/10/2015 17:14   Dg Fluoro Guided Needle Plc Aspiration/injection Loc  10/10/2015  CLINICAL DATA:  Previous medial knee hemiarthroplasty. Lateral pain post twisting injury. EXAM: LEFT IKNEE INJECTION UNDER FLUOROSCOPY FOR CT FLUOROSCOPY TIME:  12 sec, 51  uGym2 DAP TECHNIQUE: The procedure, risks (including but not limited to bleeding, infection, organ damage ), benefits, and alternatives were explained to the patient. Questions regarding the procedure were encouraged and answered. The patient understands and consents to the procedure. Overlying skin prepped with Betadine, draped in the usual sterile fashion, and infiltrated locally with Lidocaine. 18 gauge needle advanced into the patellofemoral compartment from a lateral approach. 1 ml of Lidocaine injected easily. A standard mixture of dilute Isovue was then used to opacify the knee. Some lateral extracapsular contrast was noted. No immediate complication. IMPRESSION: Technically successful left knee injection for CT. Electronically Signed   By: Lucrezia Europe M.D.   On: 10/10/2015 16:34    Disposition: 01-Home or Self Care        Follow-up Information    Follow up with MURPHY, TIMOTHY D, MD In 2 weeks.   Specialty:  Orthopedic Surgery   Contact information:   [address] [place] [address] [phone]       Follow up with [facility]   Why:  Someone from [facility] ([facility] will contact you to arrange start date and time for therapy.    Contact information:   [address] [place] [address] [phone]       Follow up with Carlyle Basques, MD In 4 weeks.   Specialty:  Infectious Diseases   Contact information:   [place] [address] [phone]       Signed: Prudencio Burly III PA-C 11/07/2015, 6:28 AM

## 2015-11-08 DIAGNOSIS — M1990 Unspecified osteoarthritis, unspecified site: Secondary | ICD-10-CM | POA: Diagnosis not present

## 2015-11-08 DIAGNOSIS — M810 Age-related osteoporosis without current pathological fracture: Secondary | ICD-10-CM | POA: Diagnosis not present

## 2015-11-08 DIAGNOSIS — T8454XA Infection and inflammatory reaction due to internal left knee prosthesis, initial encounter: Secondary | ICD-10-CM | POA: Diagnosis not present

## 2015-11-08 DIAGNOSIS — R7303 Prediabetes: Secondary | ICD-10-CM | POA: Diagnosis not present

## 2015-11-08 DIAGNOSIS — I10 Essential (primary) hypertension: Secondary | ICD-10-CM | POA: Diagnosis not present

## 2015-11-09 DIAGNOSIS — R7303 Prediabetes: Secondary | ICD-10-CM | POA: Diagnosis not present

## 2015-11-09 DIAGNOSIS — M810 Age-related osteoporosis without current pathological fracture: Secondary | ICD-10-CM | POA: Diagnosis not present

## 2015-11-09 DIAGNOSIS — I10 Essential (primary) hypertension: Secondary | ICD-10-CM | POA: Diagnosis not present

## 2015-11-09 DIAGNOSIS — T8454XA Infection and inflammatory reaction due to internal left knee prosthesis, initial encounter: Secondary | ICD-10-CM | POA: Diagnosis not present

## 2015-11-09 DIAGNOSIS — M1990 Unspecified osteoarthritis, unspecified site: Secondary | ICD-10-CM | POA: Diagnosis not present

## 2015-11-10 DIAGNOSIS — I10 Essential (primary) hypertension: Secondary | ICD-10-CM | POA: Diagnosis not present

## 2015-11-10 DIAGNOSIS — L089 Local infection of the skin and subcutaneous tissue, unspecified: Secondary | ICD-10-CM | POA: Diagnosis not present

## 2015-11-10 DIAGNOSIS — M1990 Unspecified osteoarthritis, unspecified site: Secondary | ICD-10-CM | POA: Diagnosis not present

## 2015-11-10 DIAGNOSIS — B9789 Other viral agents as the cause of diseases classified elsewhere: Secondary | ICD-10-CM | POA: Diagnosis not present

## 2015-11-10 DIAGNOSIS — M810 Age-related osteoporosis without current pathological fracture: Secondary | ICD-10-CM | POA: Diagnosis not present

## 2015-11-10 DIAGNOSIS — R7303 Prediabetes: Secondary | ICD-10-CM | POA: Diagnosis not present

## 2015-11-10 DIAGNOSIS — T8454XA Infection and inflammatory reaction due to internal left knee prosthesis, initial encounter: Secondary | ICD-10-CM | POA: Diagnosis not present

## 2015-11-10 LAB — AEROBIC/ANAEROBIC CULTURE (SURGICAL/DEEP WOUND)

## 2015-11-10 LAB — AEROBIC/ANAEROBIC CULTURE W GRAM STAIN (SURGICAL/DEEP WOUND)

## 2015-11-12 DIAGNOSIS — R7303 Prediabetes: Secondary | ICD-10-CM | POA: Diagnosis not present

## 2015-11-12 DIAGNOSIS — I10 Essential (primary) hypertension: Secondary | ICD-10-CM | POA: Diagnosis not present

## 2015-11-12 DIAGNOSIS — M1990 Unspecified osteoarthritis, unspecified site: Secondary | ICD-10-CM | POA: Diagnosis not present

## 2015-11-12 DIAGNOSIS — M810 Age-related osteoporosis without current pathological fracture: Secondary | ICD-10-CM | POA: Diagnosis not present

## 2015-11-12 DIAGNOSIS — T8454XA Infection and inflammatory reaction due to internal left knee prosthesis, initial encounter: Secondary | ICD-10-CM | POA: Diagnosis not present

## 2015-11-13 DIAGNOSIS — Z96652 Presence of left artificial knee joint: Secondary | ICD-10-CM | POA: Diagnosis not present

## 2015-11-14 DIAGNOSIS — T8489XD Other specified complication of internal orthopedic prosthetic devices, implants and grafts, subsequent encounter: Secondary | ICD-10-CM | POA: Diagnosis not present

## 2015-11-17 DIAGNOSIS — L089 Local infection of the skin and subcutaneous tissue, unspecified: Secondary | ICD-10-CM | POA: Diagnosis not present

## 2015-11-17 DIAGNOSIS — M810 Age-related osteoporosis without current pathological fracture: Secondary | ICD-10-CM | POA: Diagnosis not present

## 2015-11-17 DIAGNOSIS — I10 Essential (primary) hypertension: Secondary | ICD-10-CM | POA: Diagnosis not present

## 2015-11-17 DIAGNOSIS — T8454XA Infection and inflammatory reaction due to internal left knee prosthesis, initial encounter: Secondary | ICD-10-CM | POA: Diagnosis not present

## 2015-11-17 DIAGNOSIS — M1990 Unspecified osteoarthritis, unspecified site: Secondary | ICD-10-CM | POA: Diagnosis not present

## 2015-11-17 DIAGNOSIS — R7303 Prediabetes: Secondary | ICD-10-CM | POA: Diagnosis not present

## 2015-11-19 DIAGNOSIS — M810 Age-related osteoporosis without current pathological fracture: Secondary | ICD-10-CM | POA: Diagnosis not present

## 2015-11-19 DIAGNOSIS — I10 Essential (primary) hypertension: Secondary | ICD-10-CM | POA: Diagnosis not present

## 2015-11-19 DIAGNOSIS — T8454XA Infection and inflammatory reaction due to internal left knee prosthesis, initial encounter: Secondary | ICD-10-CM | POA: Diagnosis not present

## 2015-11-19 DIAGNOSIS — M1990 Unspecified osteoarthritis, unspecified site: Secondary | ICD-10-CM | POA: Diagnosis not present

## 2015-11-19 DIAGNOSIS — R7303 Prediabetes: Secondary | ICD-10-CM | POA: Diagnosis not present

## 2015-11-20 DIAGNOSIS — M810 Age-related osteoporosis without current pathological fracture: Secondary | ICD-10-CM | POA: Diagnosis not present

## 2015-11-20 DIAGNOSIS — I10 Essential (primary) hypertension: Secondary | ICD-10-CM | POA: Diagnosis not present

## 2015-11-20 DIAGNOSIS — T8454XA Infection and inflammatory reaction due to internal left knee prosthesis, initial encounter: Secondary | ICD-10-CM | POA: Diagnosis not present

## 2015-11-20 DIAGNOSIS — R7303 Prediabetes: Secondary | ICD-10-CM | POA: Diagnosis not present

## 2015-11-20 DIAGNOSIS — M1990 Unspecified osteoarthritis, unspecified site: Secondary | ICD-10-CM | POA: Diagnosis not present

## 2015-11-21 DIAGNOSIS — I10 Essential (primary) hypertension: Secondary | ICD-10-CM | POA: Diagnosis not present

## 2015-11-21 DIAGNOSIS — M1990 Unspecified osteoarthritis, unspecified site: Secondary | ICD-10-CM | POA: Diagnosis not present

## 2015-11-21 DIAGNOSIS — T8454XA Infection and inflammatory reaction due to internal left knee prosthesis, initial encounter: Secondary | ICD-10-CM | POA: Diagnosis not present

## 2015-11-21 DIAGNOSIS — M810 Age-related osteoporosis without current pathological fracture: Secondary | ICD-10-CM | POA: Diagnosis not present

## 2015-11-21 DIAGNOSIS — R7303 Prediabetes: Secondary | ICD-10-CM | POA: Diagnosis not present

## 2015-11-24 DIAGNOSIS — T8454XA Infection and inflammatory reaction due to internal left knee prosthesis, initial encounter: Secondary | ICD-10-CM | POA: Diagnosis not present

## 2015-11-24 DIAGNOSIS — R7303 Prediabetes: Secondary | ICD-10-CM | POA: Diagnosis not present

## 2015-11-24 DIAGNOSIS — M1990 Unspecified osteoarthritis, unspecified site: Secondary | ICD-10-CM | POA: Diagnosis not present

## 2015-11-24 DIAGNOSIS — I10 Essential (primary) hypertension: Secondary | ICD-10-CM | POA: Diagnosis not present

## 2015-11-24 DIAGNOSIS — M810 Age-related osteoporosis without current pathological fracture: Secondary | ICD-10-CM | POA: Diagnosis not present

## 2015-11-24 DIAGNOSIS — Z471 Aftercare following joint replacement surgery: Secondary | ICD-10-CM | POA: Diagnosis not present

## 2015-11-24 DIAGNOSIS — M8600 Acute hematogenous osteomyelitis, unspecified site: Secondary | ICD-10-CM | POA: Diagnosis not present

## 2015-11-24 DIAGNOSIS — M8610 Other acute osteomyelitis, unspecified site: Secondary | ICD-10-CM | POA: Diagnosis not present

## 2015-11-26 ENCOUNTER — Telehealth: Payer: Self-pay

## 2015-11-26 NOTE — Telephone Encounter (Signed)
Patient called wanting to know if it was OK to take a shower while having the PICC line in place. RN suggested covering with saran wrap and a little piece of tape to keep site from getting wet. LPN relayed message. Patient verbalized understanding. Rodman Key, LPN

## 2015-12-01 DIAGNOSIS — R7303 Prediabetes: Secondary | ICD-10-CM | POA: Diagnosis not present

## 2015-12-01 DIAGNOSIS — M1990 Unspecified osteoarthritis, unspecified site: Secondary | ICD-10-CM | POA: Diagnosis not present

## 2015-12-01 DIAGNOSIS — T8454XA Infection and inflammatory reaction due to internal left knee prosthesis, initial encounter: Secondary | ICD-10-CM | POA: Diagnosis not present

## 2015-12-01 DIAGNOSIS — M810 Age-related osteoporosis without current pathological fracture: Secondary | ICD-10-CM | POA: Diagnosis not present

## 2015-12-01 DIAGNOSIS — D649 Anemia, unspecified: Secondary | ICD-10-CM | POA: Diagnosis not present

## 2015-12-01 DIAGNOSIS — I10 Essential (primary) hypertension: Secondary | ICD-10-CM | POA: Diagnosis not present

## 2015-12-02 LAB — FUNGAL ORGANISM REFLEX

## 2015-12-02 LAB — FUNGUS CULTURE WITH STAIN

## 2015-12-02 LAB — FUNGUS CULTURE RESULT

## 2015-12-03 DIAGNOSIS — R7303 Prediabetes: Secondary | ICD-10-CM | POA: Diagnosis not present

## 2015-12-03 DIAGNOSIS — I10 Essential (primary) hypertension: Secondary | ICD-10-CM | POA: Diagnosis not present

## 2015-12-03 DIAGNOSIS — M810 Age-related osteoporosis without current pathological fracture: Secondary | ICD-10-CM | POA: Diagnosis not present

## 2015-12-03 DIAGNOSIS — M1990 Unspecified osteoarthritis, unspecified site: Secondary | ICD-10-CM | POA: Diagnosis not present

## 2015-12-03 DIAGNOSIS — T8454XA Infection and inflammatory reaction due to internal left knee prosthesis, initial encounter: Secondary | ICD-10-CM | POA: Diagnosis not present

## 2015-12-05 DIAGNOSIS — R7303 Prediabetes: Secondary | ICD-10-CM | POA: Diagnosis not present

## 2015-12-05 DIAGNOSIS — T8454XA Infection and inflammatory reaction due to internal left knee prosthesis, initial encounter: Secondary | ICD-10-CM | POA: Diagnosis not present

## 2015-12-05 DIAGNOSIS — M810 Age-related osteoporosis without current pathological fracture: Secondary | ICD-10-CM | POA: Diagnosis not present

## 2015-12-05 DIAGNOSIS — M1990 Unspecified osteoarthritis, unspecified site: Secondary | ICD-10-CM | POA: Diagnosis not present

## 2015-12-05 DIAGNOSIS — I10 Essential (primary) hypertension: Secondary | ICD-10-CM | POA: Diagnosis not present

## 2015-12-08 DIAGNOSIS — M1990 Unspecified osteoarthritis, unspecified site: Secondary | ICD-10-CM | POA: Diagnosis not present

## 2015-12-08 DIAGNOSIS — I10 Essential (primary) hypertension: Secondary | ICD-10-CM | POA: Diagnosis not present

## 2015-12-08 DIAGNOSIS — M810 Age-related osteoporosis without current pathological fracture: Secondary | ICD-10-CM | POA: Diagnosis not present

## 2015-12-08 DIAGNOSIS — T8454XA Infection and inflammatory reaction due to internal left knee prosthesis, initial encounter: Secondary | ICD-10-CM | POA: Diagnosis not present

## 2015-12-08 DIAGNOSIS — R7303 Prediabetes: Secondary | ICD-10-CM | POA: Diagnosis not present

## 2015-12-10 ENCOUNTER — Encounter: Payer: Self-pay | Admitting: Internal Medicine

## 2015-12-10 ENCOUNTER — Telehealth: Payer: Self-pay

## 2015-12-10 ENCOUNTER — Ambulatory Visit (INDEPENDENT_AMBULATORY_CARE_PROVIDER_SITE_OTHER): Payer: Medicare Other | Admitting: Internal Medicine

## 2015-12-10 VITALS — BP 135/76 | HR 76 | Temp 97.8°F | Wt 248.1 lb

## 2015-12-10 DIAGNOSIS — T8450XS Infection and inflammatory reaction due to unspecified internal joint prosthesis, sequela: Secondary | ICD-10-CM | POA: Diagnosis present

## 2015-12-10 MED ORDER — AMOXICILLIN 500 MG PO CAPS: 500.0000 mg | ORAL_CAPSULE | Freq: Three times a day (TID) | ORAL | 4 refills | Status: DC

## 2015-12-10 NOTE — Telephone Encounter (Signed)
Called and spoke with Coretta at [facility] to give Dr. Storm Frisk order to continue antibiotics until August 8th and then discontinue the PICC line Rodman Key, LPN

## 2015-12-10 NOTE — Progress Notes (Signed)
RFV: hospital follow up for joint infection Subjective:    Patient ID: Jessica Good, female    DOB: [birth year], 65 y.o.   MRN: [id number]  HPI Jessica Good is a 65 y.o. 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 She reports that for the last 3 montsh having worsening pain to left knee where she was undergonig medical management for pseudogout. During this time period, she reports having intermittent chills, subjective fevers, but also in the setting of having been treated for uti x 2. She was admitted for evaluation of her left knee possibly redo vs. Total knee arthroplasty. Her work up included having aspiration confirms crystals consistent with pseudogout. CT arthrogram. shows joint space on this lateral side, but it has perhaps narrowed some - Unable to assess the meniscus, as the dye does not appear to be pooled around within the joint.There is no active infection. On 6/26 she underwent old HW removal and TKA implantation, in a one staged revision. Her OR tissue cx from 6/26 are showing rare gnr, and reincubating for identification. ID consulted for abtx recs. She is afebrile thus far, with tmax of 100.0 no leukocytosis. She reports taking cipro for uti a few weeks ago.She was discharged on 6 wks of ceftriaxone for which she should complete on 12/16/2015.  Micro: 6/26: few strep viridans Current Outpatient Prescriptions on File Prior to Visit  Medication Sig Dispense Refill  . acetaminophen (TYLENOL) 500 MG tablet Take 500 mg by mouth every 6 (six) hours as needed for moderate pain.    Marland Kitchen aspirin 325 MG tablet Take 1 tablet (325 mg total) by mouth daily. 30 tablet 0  . aspirin EC 325 MG tablet Take 1 tablet (325 mg total) by mouth daily. 30 tablet 0  . cefTRIAXone 2 g in dextrose 5 % 50 mL Inject 2 g into the vein daily. 42 Dose 0  . lisinopril (PRINIVIL,ZESTRIL) 20 MG tablet  Take 20 mg by mouth daily.     . methocarbamol (ROBAXIN) 500 MG tablet Take 1 tablet (500 mg total) by mouth every 6 (six) hours as needed for muscle spasms. 40 tablet 0  . oxyCODONE-acetaminophen (ROXICET) 5-325 MG tablet Take 1-2 tablets by mouth every 4 (four) hours as needed for severe pain. 40 tablet 0  . Phenyleph-Doxylamine-DM-APAP (ALKA SELTZER PLUS PO) Take 1 tablet by mouth daily as needed (heartburn).    . sucralfate (CARAFATE) 1 G tablet Take 1 tablet (1 g total) by mouth 4 (four) times daily -  with meals and at bedtime. 360 tablet 3  . cholecalciferol (VITAMIN D) 1000 UNITS tablet Take 1,000 Units by mouth daily.    . diclofenac (VOLTAREN) 75 MG EC tablet Take 75 mg by mouth 2 (two) times daily.    . furosemide (LASIX) 40 MG tablet Take 40 mg by mouth daily as needed for fluid.     . Multiple Vitamin (MULTIVITAMIN WITH MINERALS) TABS Take 1 tablet by mouth daily.    Marland Kitchen omeprazole (PRILOSEC) 20 MG capsule Take 1 capsule (20 mg total) by mouth daily. (Patient not taking: Reported on 12/10/2015) 30 capsule 0  . ondansetron (ZOFRAN) 4 MG tablet Take 1 tablet (4 mg total) by mouth every 8 (eight) hours as needed for nausea or vomiting. (Patient not taking: Reported on 12/10/2015) 40 tablet 0  . vitamin B-12 (CYANOCOBALAMIN) 500 MCG tablet Take 500 mcg by mouth daily.    Marland Kitchen  vitamin C (ASCORBIC ACID) 500 MG tablet Take 500 mg by mouth daily.     No current facility-administered medications on file prior to visit.    Active Ambulatory Problems    Diagnosis Date Noted  . Constipation due to opioid therapy 02/13/2014  . Gastroesophageal reflux disease without esophagitis 02/13/2014  . Arthritis of knee 04/09/2014  . Primary localized osteoarthritis of left knee 11/27/2014  . Abnormality of gait 07/10/2015  . Morbid obesity ([place]) 07/10/2015  . Pseudogout of knee 11/03/2015  . Postoperative anemia due to acute blood loss 11/04/2015  . H/O arthroplasty   . Prosthetic joint infection ([place])   .  Right knee pain    Resolved Ambulatory Problems    Diagnosis Date Noted  . No Resolved Ambulatory Problems   Past Medical History:  Diagnosis Date  . Adenomatous colon polyp   . Anemia   . Anxiety   . Cataract   . Constipation due to pain medication   . Diabetes mellitus without complication ([place])   . Edema of lower extremity   . Gallstones   . Gastritis   . GERD (gastroesophageal reflux disease)   . Hiatal hernia   . Hyperlipidemia   . Hypertension   . IBS (irritable bowel syndrome)   . Loose, teeth   . Osteoarthritis   . Osteoporosis   . Pseudogout   . Sleep apnea   . Urgency of urination    Social History  Substance Use Topics  . Smoking status: Never Smoker  . Smokeless tobacco: Never Used  . Alcohol use No  family history includes Diabetes in her father and maternal grandmother; Heart disease in her father; Pancreatic cancer in her mother.  Review of Systems  Constitutional: Negative for fever, chills, diaphoresis, activity change, appetite change, fatigue and unexpected weight change.  HENT: Negative for congestion, sore throat, rhinorrhea, sneezing, trouble swallowing and sinus pressure.  Eyes: Negative for photophobia and visual disturbance.  Respiratory: Negative for cough, chest tightness, shortness of breath, wheezing and stridor.  Cardiovascular: Negative for chest pain, palpitations and leg swelling.  Gastrointestinal: Negative for nausea, vomiting, abdominal pain, diarrhea, constipation, blood in stool, abdominal distention and anal bleeding.  Genitourinary: Negative for dysuria, hematuria, flank pain and difficulty urinating.  Musculoskeletal: Negative for myalgias, back pain, joint swelling, arthralgias and gait problem.  Skin: Negative for color change, pallor, rash and wound.  Neurological: Negative for dizziness, tremors, weakness and light-headedness.  Hematological: Negative for adenopathy. Does not bruise/bleed easily.  Psychiatric/Behavioral:  Negative for behavioral problems, confusion, sleep disturbance, dysphoric mood, decreased concentration and agitation.       Objective:   Physical Exam BP 135/76 (BP Location: Left Wrist, Patient Position: Sitting, Cuff Size: Normal)   Pulse 76   Temp 97.8 F (36.6 C)   Wt 248 lb 1.9 oz (112.5 kg)   BMI 46.88 kg/m  Physical Exam  Constitutional:  oriented to person, place, and time. appears well-developed and well-nourished. No distress.  HENT: [place]/AT, PERRLA, no scleral icterus Mouth/Throat: Oropharynx is clear and moist. No oropharyngeal exudate.  Cardiovascular: Normal rate, regular rhythm and normal heart sounds. Exam reveals no gallop and no friction rub.  No murmur heard.  Pulmonary/Chest: Effort normal and breath sounds normal. No respiratory distress.  has no wheezes.  Neck = supple, no nuchal rigidity Abdominal: Soft. Bowel sounds are normal.  exhibits no distension. There is no tenderness.  Lymphadenopathy: no cervical adenopathy. No axillary adenopathy Neurological: alert and oriented to person, place, and time.  Skin: Skin is warm and dry. No rash noted. No erythema.  Psychiatric: a normal mood and affect.  behavior is normal.   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 She is on 5th of 6 wks of abtx course of ceftriaxone for strep viridans infection. Inflammatory markers have normalized. Will get picc line pulled on 8/8, then commence amox 564m tid for addn 4.549month

## 2015-12-11 DIAGNOSIS — R7303 Prediabetes: Secondary | ICD-10-CM | POA: Diagnosis not present

## 2015-12-11 DIAGNOSIS — I10 Essential (primary) hypertension: Secondary | ICD-10-CM | POA: Diagnosis not present

## 2015-12-11 DIAGNOSIS — M1990 Unspecified osteoarthritis, unspecified site: Secondary | ICD-10-CM | POA: Diagnosis not present

## 2015-12-11 DIAGNOSIS — M810 Age-related osteoporosis without current pathological fracture: Secondary | ICD-10-CM | POA: Diagnosis not present

## 2015-12-11 DIAGNOSIS — T8454XA Infection and inflammatory reaction due to internal left knee prosthesis, initial encounter: Secondary | ICD-10-CM | POA: Diagnosis not present

## 2015-12-15 DIAGNOSIS — T8489XD Other specified complication of internal orthopedic prosthetic devices, implants and grafts, subsequent encounter: Secondary | ICD-10-CM | POA: Diagnosis not present

## 2015-12-16 DIAGNOSIS — M810 Age-related osteoporosis without current pathological fracture: Secondary | ICD-10-CM | POA: Diagnosis not present

## 2015-12-16 DIAGNOSIS — T8454XA Infection and inflammatory reaction due to internal left knee prosthesis, initial encounter: Secondary | ICD-10-CM | POA: Diagnosis not present

## 2015-12-16 DIAGNOSIS — R7303 Prediabetes: Secondary | ICD-10-CM | POA: Diagnosis not present

## 2015-12-16 DIAGNOSIS — M1990 Unspecified osteoarthritis, unspecified site: Secondary | ICD-10-CM | POA: Diagnosis not present

## 2015-12-16 DIAGNOSIS — I10 Essential (primary) hypertension: Secondary | ICD-10-CM | POA: Diagnosis not present

## 2015-12-16 DIAGNOSIS — E876 Hypokalemia: Secondary | ICD-10-CM | POA: Diagnosis not present

## 2015-12-17 ENCOUNTER — Telehealth: Payer: Self-pay | Admitting: *Deleted

## 2015-12-17 ENCOUNTER — Telehealth: Payer: Self-pay | Admitting: Infectious Disease

## 2015-12-17 DIAGNOSIS — E876 Hypokalemia: Secondary | ICD-10-CM | POA: Diagnosis not present

## 2015-12-17 DIAGNOSIS — N390 Urinary tract infection, site not specified: Secondary | ICD-10-CM | POA: Diagnosis not present

## 2015-12-17 DIAGNOSIS — M25519 Pain in unspecified shoulder: Secondary | ICD-10-CM | POA: Diagnosis not present

## 2015-12-17 LAB — ACID FAST CULTURE WITH REFLEXED SENSITIVITIES: ACID FAST CULTURE - AFSCU3: NEGATIVE

## 2015-12-17 NOTE — Telephone Encounter (Signed)
Patient is going to see her PCP today. Jessica Good

## 2015-12-17 NOTE — Telephone Encounter (Signed)
RN spoke with the patient.  Shared Dr. Derek M[place] message.  Patient will need to find transportation to either the ED or her PCP.  She is going today.

## 2015-12-17 NOTE — Telephone Encounter (Signed)
Lorne Skeens, RN has spoken with the patient, relayed results and instructions. Landis Gandy, RN

## 2015-12-17 NOTE — Telephone Encounter (Signed)
Received critical lab value of calcium of 5.8 this morning at 5:46  Blood was drawn yesterday am at 11am.  Patient should go to PCP or ED for redraw and if lab is real then she should be emergently treated for low calcium  I called the pateint at 548 am and she did not pick up o nher mobile number

## 2015-12-17 NOTE — Telephone Encounter (Signed)
Patient is seeing her PCP today.

## 2015-12-17 NOTE — Telephone Encounter (Signed)
Ok excellent

## 2015-12-17 NOTE — Telephone Encounter (Signed)
Patient called stating she was seen by her PCP today and has a UTI and they wanted to prescribe her amoxicillin. She also wanted to know if it was ok to have a pedicure and to go swimming in a lake or pool. She is having more frequent stools, right now three times daily. She was given an Rx for calcium due to abnormal calcium lab. Jessica Good

## 2015-12-17 NOTE — Telephone Encounter (Signed)
-----   Message from Jessica Hayward, MD sent at 12/17/2015  5:52 AM EDT ----- Received critical lab at 535 this am and have called pt but could not leave a message calcium was 5.4. Protein 4.2. They did not have an albumin. She should have her labs redrawn today at PCP or ED and have hypocalcemia corrected

## 2015-12-17 NOTE — Telephone Encounter (Signed)
-----   Message from Truman Hayward, MD sent at 12/17/2015  5:52 AM EDT ----- Received critical lab at 535 this am and have called pt but could not leave a message calcium was 5.4. Protein 4.2. They did not have an albumin. She should have her labs redrawn today at PCP or ED and have hypocalcemia corrected

## 2015-12-18 DIAGNOSIS — M6281 Muscle weakness (generalized): Secondary | ICD-10-CM | POA: Diagnosis not present

## 2015-12-18 DIAGNOSIS — M25462 Effusion, left knee: Secondary | ICD-10-CM | POA: Diagnosis not present

## 2015-12-18 DIAGNOSIS — M25562 Pain in left knee: Secondary | ICD-10-CM | POA: Diagnosis not present

## 2015-12-18 DIAGNOSIS — M25662 Stiffness of left knee, not elsewhere classified: Secondary | ICD-10-CM | POA: Diagnosis not present

## 2015-12-19 NOTE — Telephone Encounter (Signed)
I will call her and find out more details and give advice.

## 2015-12-22 DIAGNOSIS — D72819 Decreased white blood cell count, unspecified: Secondary | ICD-10-CM | POA: Diagnosis not present

## 2015-12-22 DIAGNOSIS — E871 Hypo-osmolality and hyponatremia: Secondary | ICD-10-CM | POA: Diagnosis not present

## 2015-12-22 DIAGNOSIS — S46912A Strain of unspecified muscle, fascia and tendon at shoulder and upper arm level, left arm, initial encounter: Secondary | ICD-10-CM | POA: Diagnosis not present

## 2015-12-23 ENCOUNTER — Encounter: Payer: Self-pay | Admitting: Sports Medicine

## 2015-12-23 ENCOUNTER — Ambulatory Visit (INDEPENDENT_AMBULATORY_CARE_PROVIDER_SITE_OTHER): Payer: Medicare Other | Admitting: Sports Medicine

## 2015-12-23 DIAGNOSIS — M25571 Pain in right ankle and joints of right foot: Secondary | ICD-10-CM

## 2015-12-23 DIAGNOSIS — M25562 Pain in left knee: Secondary | ICD-10-CM

## 2015-12-23 NOTE — Assessment & Plan Note (Addendum)
Likely secondary to valgus deviation at the tibiotalar joint.  Compression should be helpful. - See above for plan

## 2015-12-23 NOTE — Assessment & Plan Note (Addendum)
Patient is here with complaints of 3-4 weeks of left-sided knee pain. 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 Patient's range of motion and strength of her affected knee appeared relatively normal for this stage of recovery.  Observation of her gait yielded substantial valgus stress to the left ankle with associated valgus stress at the ipsilateral knee. Due to this stress from valgus gait shift it that she is experiencing significant irritation over the adductor muscle group attaching to the pes anserine. - Encouraged regular icing - Encouraged continuation and compliance with physical therapy. - Additional medial support was placed within the left-sided orthotic at the region of the hindfoot. - Patient was sized for compressive ankle sleeve to help with additional ankle support as patient will likely require higher ankle support to fully correct her valgus deviation. Some gentle compression with an ace wrap while gradually increasing her walking may be helpful with her symptoms.

## 2015-12-23 NOTE — Progress Notes (Signed)
HPI  CC: Left foot and left knee pain Patient is here with complaints of left foot and knee pain. She states that her pain began 3-4 weeks ago. Pain is present with any and all mobilization. Pain is located to the medial aspect of the knee and the medial aspect and longitudinal arch of the foot.   Patient states that 7 weeks ago she underwent a revision of an infected partial knee replacement (from one year prior) that was converted to a total knee arthroplasty. She reports that because of the complications of this knee replacement she has spent a significant amount of time without the ability to ambulate appropriately. She endorses swelling and warmth of the affected knee as well as some swelling in her distal left foot and ankle. She denies any fevers. She denies significant pain with range of motion.  Past HX; TKR of RT knee in 2015 with no issues   ROS. No Right knee pain No giving way Chronic foot pain that was relieved by orthotics prior to the knee issues  CC and VS noted.  Objective: BP (!) 141/77   Ht 5\' 1"  (1.549 m)   Wt 240 lb (108.9 kg)   BMI 45.35 kg/m  Gen: NAD, alert, cooperative. CV: Well-perfused. Resp: Non-labored. Neuro: Sensation intact throughout. Knee, Left: Normal postsurgical knee with no erythema or effusion or obvious bony abnormalities. Palpation with warmth throughout and medial tenderness near the insertion of the pes anserine. No patellar tenderness or condyle tenderness. ROM normal in flexion and extension for post-op status;. No evidence of ligamental instability. Patellar and quadriceps tendons unremarkable. Hamstring and quadriceps strength is normal. Ankle, Left: Some visible swelling. Range of motion is full in all directions. No evidence of instability; but significant valgus deviation at the tibiotalar joint during ambulation. Pronation with navicular drop/ midfoot shift. Left ankle is now subluxed medially on foot and not corrected by  orthotics.  Tenderness noted diffusely over the longitudinal arch. No tenderness on posterior aspects of lateral and medial malleolus. Able to walk 4 steps with the aid of a cane.   Assessment and plan:  Left knee pain Patient is here with complaints of 3-4 weeks of left-sided knee pain. 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 Patient's range of motion and strength of her affected knee appeared relatively normal for this stage of recovery. Observation of her gait yielded substantial valgus stress to the left ankle with associated valgus stress at the ipsilateral knee. Due to this stress as well as the physical size of the knee arthroplasty it is likely that she is experiencing significant irritation over the adducting muscle group attaching to the pes anserine. - Encouraged regular icing - Encouraged continuation and compliance with physical therapy. - Additional medial support was placed within the left-sided orthotic at the region of the hindfoot. - Patient was sized for compressive ankle sleeve to help with additional ankle support as patient will likely require higher ankle support to fully correct her valgus deviation.  Right ankle pain Likely secondary to valgus deviation at the tibiotalar joint. - See above for plan   Meds ordered this encounter  Medications  . calcitRIOL (ROCALTROL) 0.25 MCG capsule    Sig: TAKE 1 CAPSULE(S) TWICE A DAY BY ORAL ROUTE FOR 30 DAYS.    Refill:  0  . HYDROcodone-acetaminophen (NORCO/VICODIN) 5-325 MG tablet    Sig: Take 1 tablet by mouth every 8 (eight) hours as needed.  Refill:  0  . Oxycodone HCl 10 MG TABS    Sig: TAKE 1 TABLET BY MOUTH EVERY 4 TO 6 HOURS FOR 10 DAYS    Refill:  0  . ibuprofen (ADVIL,MOTRIN) 800 MG tablet    Sig: Take 800 mg by mouth 3 (three) times daily as needed.    Refill:  1  . ciprofloxacin (CIPRO) 250 MG tablet    Sig: Take 250 mg by mouth 2 (two)  times daily.    Refill:  0  . ciprofloxacin (CIPRO) 500 MG tablet    Sig: Take 500 mg by mouth 2 (two) times daily.    Refill:  0  . methylPREDNISolone (MEDROL DOSEPAK) 4 MG TBPK tablet    Sig: TAKE AS DIRECTED FOR 6 DAYS    Refill:  0     Elberta Leatherwood, MD,MS,  PGY3 12/23/2015 6:24 PM

## 2015-12-25 DIAGNOSIS — M6281 Muscle weakness (generalized): Secondary | ICD-10-CM | POA: Diagnosis not present

## 2015-12-25 DIAGNOSIS — M25562 Pain in left knee: Secondary | ICD-10-CM | POA: Diagnosis not present

## 2015-12-25 DIAGNOSIS — M25662 Stiffness of left knee, not elsewhere classified: Secondary | ICD-10-CM | POA: Diagnosis not present

## 2015-12-25 DIAGNOSIS — M25462 Effusion, left knee: Secondary | ICD-10-CM | POA: Diagnosis not present

## 2015-12-30 DIAGNOSIS — M25662 Stiffness of left knee, not elsewhere classified: Secondary | ICD-10-CM | POA: Diagnosis not present

## 2015-12-30 DIAGNOSIS — M25462 Effusion, left knee: Secondary | ICD-10-CM | POA: Diagnosis not present

## 2015-12-30 DIAGNOSIS — M25562 Pain in left knee: Secondary | ICD-10-CM | POA: Diagnosis not present

## 2015-12-30 DIAGNOSIS — M6281 Muscle weakness (generalized): Secondary | ICD-10-CM | POA: Diagnosis not present

## 2016-01-01 ENCOUNTER — Telehealth: Payer: Self-pay

## 2016-01-01 NOTE — Telephone Encounter (Signed)
Recall EUS Oct abnormal stomach.  Note sent to myself to set up

## 2016-01-13 DIAGNOSIS — M25562 Pain in left knee: Secondary | ICD-10-CM | POA: Diagnosis not present

## 2016-01-13 DIAGNOSIS — M25662 Stiffness of left knee, not elsewhere classified: Secondary | ICD-10-CM | POA: Diagnosis not present

## 2016-01-13 DIAGNOSIS — M6281 Muscle weakness (generalized): Secondary | ICD-10-CM | POA: Diagnosis not present

## 2016-01-13 DIAGNOSIS — M25462 Effusion, left knee: Secondary | ICD-10-CM | POA: Diagnosis not present

## 2016-01-15 DIAGNOSIS — M25562 Pain in left knee: Secondary | ICD-10-CM | POA: Diagnosis not present

## 2016-01-15 DIAGNOSIS — M6281 Muscle weakness (generalized): Secondary | ICD-10-CM | POA: Diagnosis not present

## 2016-01-15 DIAGNOSIS — M25662 Stiffness of left knee, not elsewhere classified: Secondary | ICD-10-CM | POA: Diagnosis not present

## 2016-01-15 DIAGNOSIS — M25462 Effusion, left knee: Secondary | ICD-10-CM | POA: Diagnosis not present

## 2016-01-20 DIAGNOSIS — M25462 Effusion, left knee: Secondary | ICD-10-CM | POA: Diagnosis not present

## 2016-01-20 DIAGNOSIS — M25662 Stiffness of left knee, not elsewhere classified: Secondary | ICD-10-CM | POA: Diagnosis not present

## 2016-01-20 DIAGNOSIS — M25562 Pain in left knee: Secondary | ICD-10-CM | POA: Diagnosis not present

## 2016-01-20 DIAGNOSIS — M6281 Muscle weakness (generalized): Secondary | ICD-10-CM | POA: Diagnosis not present

## 2016-01-22 ENCOUNTER — Ambulatory Visit (INDEPENDENT_AMBULATORY_CARE_PROVIDER_SITE_OTHER): Payer: Medicare Other | Admitting: Internal Medicine

## 2016-01-22 VITALS — BP 146/89 | HR 85 | Temp 97.7°F | Wt 250.0 lb

## 2016-01-22 DIAGNOSIS — M6281 Muscle weakness (generalized): Secondary | ICD-10-CM | POA: Diagnosis not present

## 2016-01-22 DIAGNOSIS — M25662 Stiffness of left knee, not elsewhere classified: Secondary | ICD-10-CM | POA: Diagnosis not present

## 2016-01-22 DIAGNOSIS — T8450XS Infection and inflammatory reaction due to unspecified internal joint prosthesis, sequela: Secondary | ICD-10-CM | POA: Diagnosis not present

## 2016-01-22 DIAGNOSIS — Z23 Encounter for immunization: Secondary | ICD-10-CM | POA: Diagnosis not present

## 2016-01-22 DIAGNOSIS — R6883 Chills (without fever): Secondary | ICD-10-CM | POA: Diagnosis not present

## 2016-01-22 DIAGNOSIS — M25462 Effusion, left knee: Secondary | ICD-10-CM | POA: Diagnosis not present

## 2016-01-22 DIAGNOSIS — M25562 Pain in left knee: Secondary | ICD-10-CM | POA: Diagnosis not present

## 2016-01-22 LAB — CBC WITH DIFFERENTIAL/PLATELET
BASOS ABS: 0 {cells}/uL (ref 0–200)
Basophils Relative: 0 %
EOS ABS: 80 {cells}/uL (ref 15–500)
EOS PCT: 2 %
HCT: 38.9 % (ref 35.0–45.0)
HEMOGLOBIN: 12.7 g/dL (ref 11.7–15.5)
LYMPHS ABS: 1320 {cells}/uL (ref 850–3900)
Lymphocytes Relative: 33 %
MCH: 28.5 pg (ref 27.0–33.0)
MCHC: 32.6 g/dL (ref 32.0–36.0)
MCV: 87.4 fL (ref 80.0–100.0)
MONOS PCT: 7 %
MPV: 10.1 fL (ref 7.5–12.5)
Monocytes Absolute: 280 cells/uL (ref 200–950)
NEUTROS PCT: 58 %
Neutro Abs: 2320 cells/uL (ref 1500–7800)
Platelets: 262 10*3/uL (ref 140–400)
RBC: 4.45 MIL/uL (ref 3.80–5.10)
RDW: 13.7 % (ref 11.0–15.0)
WBC: 4 10*3/uL (ref 3.8–10.8)

## 2016-01-22 LAB — BASIC METABOLIC PANEL
BUN: 11 mg/dL (ref 7–25)
CO2: 29 mmol/L (ref 20–31)
CREATININE: 0.65 mg/dL (ref 0.50–0.99)
Calcium: 9.2 mg/dL (ref 8.6–10.4)
Chloride: 105 mmol/L (ref 98–110)
GLUCOSE: 95 mg/dL (ref 65–99)
Potassium: 4.5 mmol/L (ref 3.5–5.3)
Sodium: 142 mmol/L (ref 135–146)

## 2016-01-22 MED ORDER — TRAMADOL HCL 50 MG PO TABS: 50.0000 mg | ORAL_TABLET | Freq: Two times a day (BID) | ORAL | 0 refills | Status: DC | PRN

## 2016-01-22 NOTE — Progress Notes (Signed)
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   MRN: [id number]  HPI Shaleka is a 65yo F with history of unicompartmental knee arthroplasty in July 2016 which started to cause her pain roughly 3 months from the procedure. She was treated for pseduogout but then also complained of chills, subjective fevers. She underwent HW remova with TKA implantation, in a 1 staged revision on 6/26, with cultures growing few strep viridans. Her inflammatory markers were elevated at that time. She was discharged on 6 wks of ceftriaxone which she completed on 8/8 and transitioned to amoxicillin 500mg  TID to complete a 6 month treatment course. She has been taking the abtx without difficulty though she reports still having swelling to her knee and pain especially with more weight bearing and participation with physical therapy. She is no longer taking any opiates which provided better pain management than 600mg  ibuprofen BID which she takes presently. In the last few weeks she has had occasional chills which makes her concerned that she is having another infection.  Outpatient Encounter Prescriptions as of 01/22/2016  Medication Sig  . acetaminophen (TYLENOL) 500 MG tablet Take 500 mg by mouth every 6 (six) hours as needed for moderate pain.  Marland Kitchen amoxicillin (AMOXIL) 500 MG capsule Take 1 capsule (500 mg total) by mouth 3 (three) times daily. Start taking on aug 9th  . aspirin 325 MG tablet Take 1 tablet (325 mg total) by mouth daily.  Marland Kitchen aspirin EC 325 MG tablet Take 1 tablet (325 mg total) by mouth daily.  . calcitRIOL (ROCALTROL) 0.25 MCG capsule TAKE 1 CAPSULE(S) TWICE A DAY BY ORAL ROUTE FOR 30 DAYS.  Marland Kitchen cholecalciferol (VITAMIN D) 1000 UNITS tablet Take 1,000 Units by mouth daily.  . furosemide (LASIX) 40 MG tablet Take 40 mg by mouth daily as needed for fluid.   Marland Kitchen HYDROcodone-acetaminophen (NORCO/VICODIN) 5-325 MG tablet Take 1 tablet by mouth every 8  (eight) hours as needed.  Marland Kitchen ibuprofen (ADVIL,MOTRIN) 800 MG tablet Take 800 mg by mouth 3 (three) times daily as needed.  Marland Kitchen lisinopril (PRINIVIL,ZESTRIL) 20 MG tablet Take 20 mg by mouth daily.   . Multiple Vitamin (MULTIVITAMIN WITH MINERALS) TABS Take 1 tablet by mouth daily.  . vitamin B-12 (CYANOCOBALAMIN) 500 MCG tablet Take 500 mcg by mouth daily.  . vitamin C (ASCORBIC ACID) 500 MG tablet Take 500 mg by mouth daily.  . ciprofloxacin (CIPRO) 250 MG tablet Take 250 mg by mouth 2 (two) times daily.  . ciprofloxacin (CIPRO) 500 MG tablet Take 500 mg by mouth 2 (two) times daily.  . diclofenac (VOLTAREN) 75 MG EC tablet Take 75 mg by mouth 2 (two) times daily.  . methocarbamol (ROBAXIN) 500 MG tablet Take 1 tablet (500 mg total) by mouth every 6 (six) hours as needed for muscle spasms. (Patient not taking: Reported on 01/22/2016)  . methylPREDNISolone (MEDROL DOSEPAK) 4 MG TBPK tablet TAKE AS DIRECTED FOR 6 DAYS  . omeprazole (PRILOSEC) 20 MG capsule Take 1 capsule (20 mg total) by mouth daily. (Patient not taking: Reported on 01/22/2016)  . ondansetron (ZOFRAN) 4 MG tablet Take 1 tablet (4 mg total) by mouth every 8 (eight) hours as needed for nausea or vomiting. (Patient not taking: Reported on 01/22/2016)  . Oxycodone HCl 10 MG TABS TAKE 1 TABLET BY MOUTH EVERY 4 TO 6 HOURS FOR 10 DAYS  . oxyCODONE-acetaminophen (ROXICET) 5-325 MG tablet Take 1-2 tablets by mouth every  4 (four) hours as needed for severe pain. (Patient not taking: Reported on 01/22/2016)  . Phenyleph-Doxylamine-DM-APAP (ALKA SELTZER PLUS PO) Take 1 tablet by mouth daily as needed (heartburn).  . sucralfate (CARAFATE) 1 G tablet Take 1 tablet (1 g total) by mouth 4 (four) times daily -  with meals and at bedtime. (Patient not taking: Reported on 01/22/2016)   No facility-administered encounter medications on file as of 01/22/2016.      Patient Active Problem List   Diagnosis Date Noted  . Right ankle pain 12/23/2015  . H/O  arthroplasty   . Prosthetic joint infection ([place])   . Left knee pain   . Postoperative anemia due to acute blood loss 11/04/2015  . Pseudogout of knee 11/03/2015  . Abnormality of gait 07/10/2015  . Morbid obesity ([place]) 07/10/2015  . Primary localized osteoarthritis of left knee 11/27/2014  . Arthritis of knee 04/09/2014  . Constipation due to opioid therapy 02/13/2014  . Gastroesophageal reflux disease without esophagitis 02/13/2014     Health Maintenance Due  Topic Date Due  . FOOT EXAM  01/13/1960  . OPHTHALMOLOGY EXAM  01/13/1960  . TETANUS/TDAP  01/12/1969  . MAMMOGRAM  01/13/2000  . ZOSTAVAX  01/12/2010  . DEXA SCAN  01/13/2015  . PNA vac Low Risk Adult (1 of 2 - PCV13) 01/13/2015  . INFLUENZA VACCINE  12/09/2015     Review of Systems  Physical Exam   BP (!) 146/89   Pulse 85   Temp 97.7 F (36.5 C) (Oral)   Wt 250 lb (113.4 kg)   SpO2 98%   BMI 47.24 kg/m  Physical Exam  Constitutional:  oriented to person, place, and time. appears well-developed and well-nourished. No distress.  HENT: [place]/AT, PERRLA, no scleral icterus Mouth/Throat: Oropharynx is clear and moist. No oropharyngeal exudate.  Cardiovascular: Normal rate, regular rhythm and normal heart sounds. Exam reveals no gallop and no friction rub.  No murmur heard.  Pulmonary/Chest: Effort normal and breath sounds normal. No respiratory distress.  has no wheezes.   Skin: Skin is warm and dry. No rash noted. No erythema. Left knee feels slightly warm but no appreciable swelling Psychiatric: a normal mood and affect.  behavior is normal.   CBC Lab Results  Component Value Date   WBC 5.1 11/05/2015   RBC 3.54 (L) 11/05/2015   HGB 10.3 (L) 11/05/2015   HCT 32.3 (L) 11/05/2015   PLT 264 11/05/2015   MCV 91.2 11/05/2015   MCH 29.1 11/05/2015   MCHC 31.9 11/05/2015   RDW 13.7 11/05/2015   LYMPHSABS 1.4 10/27/2015   MONOABS 0.4 10/27/2015   EOSABS 0.1 10/27/2015   BASOSABS 0.0 10/27/2015   BMET Lab  Results  Component Value Date   NA 136 11/04/2015   K 3.7 11/04/2015   CL 103 11/04/2015   CO2 27 11/04/2015   GLUCOSE 128 (H) 11/04/2015   BUN 8 11/04/2015   CREATININE 0.87 11/04/2015   CALCIUM 8.4 (L) 11/04/2015   GFRNONAA >60 11/04/2015   GFRAA >60 11/04/2015     Assessment and Plan   pji = continue on amoxicillin until end of December to complete 6 months of treatment. we will check inflammatory markers, continue on amoxicillin for now  Chills= will check blood cx  Knee pain = will do a trial of tramadol to see if it improves her sx  Health maintenance = will give flu vac and pneumococcal vaccine

## 2016-01-23 ENCOUNTER — Other Ambulatory Visit: Payer: Self-pay

## 2016-01-23 DIAGNOSIS — Z23 Encounter for immunization: Secondary | ICD-10-CM

## 2016-01-23 LAB — SEDIMENTATION RATE: SED RATE: 9 mm/h (ref 0–30)

## 2016-01-23 LAB — C-REACTIVE PROTEIN: CRP: 11.2 mg/L — AB (ref <8.0)

## 2016-01-26 DIAGNOSIS — T8489XD Other specified complication of internal orthopedic prosthetic devices, implants and grafts, subsequent encounter: Secondary | ICD-10-CM | POA: Diagnosis not present

## 2016-01-28 ENCOUNTER — Telehealth: Payer: Self-pay

## 2016-01-28 LAB — CULTURE, BLOOD (SINGLE)
ORGANISM ID, BACTERIA: NO GROWTH
ORGANISM ID, BACTERIA: NO GROWTH

## 2016-01-28 NOTE — Telephone Encounter (Signed)
Patient called wanting to know results of recent lab work. Results given. Patient wants to know since there was no growth from the blood work but the C-reactive protein is elevated is there any thing you want her to do. Please advise. Rodman Key, LPN

## 2016-01-30 NOTE — Telephone Encounter (Signed)
I called her back to results. Referred her to see dr. Percell Miller soon

## 2016-02-04 ENCOUNTER — Telehealth: Payer: Self-pay

## 2016-02-04 NOTE — Telephone Encounter (Signed)
-----   Message from Barron Alvine, RN sent at 01/01/2016  2:56 PM EDT ----- Pt needs to have recall EUS for abnormal stomach per recall letter.

## 2016-02-05 NOTE — Telephone Encounter (Signed)
Left message on machine to call back  

## 2016-02-10 NOTE — Telephone Encounter (Signed)
The [place] until [place] and would like to call back to set up.

## 2016-03-17 ENCOUNTER — Ambulatory Visit: Payer: Medicare Other | Admitting: Internal Medicine

## 2016-03-23 ENCOUNTER — Ambulatory Visit (INDEPENDENT_AMBULATORY_CARE_PROVIDER_SITE_OTHER): Payer: Medicare Other | Admitting: Internal Medicine

## 2016-03-23 ENCOUNTER — Ambulatory Visit
Admission: RE | Admit: 2016-03-23 | Discharge: 2016-03-23 | Disposition: A | Discharge status: Home / Self Care | Payer: Medicare Other | Source: Ambulatory Visit | Attending: Internal Medicine | Admitting: Internal Medicine

## 2016-03-23 VITALS — BP 162/75 | HR 77 | Wt 250.0 lb

## 2016-03-23 DIAGNOSIS — M7989 Other specified soft tissue disorders: Secondary | ICD-10-CM | POA: Diagnosis not present

## 2016-03-23 DIAGNOSIS — M25561 Pain in right knee: Secondary | ICD-10-CM

## 2016-03-23 DIAGNOSIS — T8450XS Infection and inflammatory reaction due to unspecified internal joint prosthesis, sequela: Secondary | ICD-10-CM | POA: Diagnosis not present

## 2016-03-23 NOTE — Progress Notes (Signed)
Patient ID: Jessica Good, female   DOB: 1951-03-19, 65 y.o.   MRN: [id number]  HPI  Jessica Good is 65yo F with hx of PJI from unicompartmental knee arthroplasty, where HW removed, but had 1 staged revision on 6/26 with new implant. OR cx grew strep viridans, where she was treated with 6 wk of iv ceftiraxone then transitioned to amoxicillin TID for 6 months up until December 27th. She reports she has increase strength in her lower extremties abeing able to ambulate for longer periods of time. She is now having some pain with her right knee (also a TKA) has not told dr Percell Miller yet until next week's appt Outpatient Encounter Prescriptions as of 03/23/2016  Medication Sig  . acetaminophen (TYLENOL) 500 MG tablet Take 500 mg by mouth every 6 (six) hours as needed for moderate pain.  Marland Kitchen amoxicillin (AMOXIL) 500 MG capsule Take 1 capsule (500 mg total) by mouth 3 (three) times daily. Start taking on aug 9th  . aspirin 325 MG tablet Take 1 tablet (325 mg total) by mouth daily.  Marland Kitchen aspirin EC 325 MG tablet Take 1 tablet (325 mg total) by mouth daily.  . calcitRIOL (ROCALTROL) 0.25 MCG capsule TAKE 1 CAPSULE(S) TWICE A DAY BY ORAL ROUTE FOR 30 DAYS.  Marland Kitchen cholecalciferol (VITAMIN D) 1000 UNITS tablet Take 1,000 Units by mouth daily.  . diclofenac (VOLTAREN) 75 MG EC tablet Take 75 mg by mouth 2 (two) times daily.  . furosemide (LASIX) 40 MG tablet Take 40 mg by mouth daily as needed for fluid.   Marland Kitchen HYDROcodone-acetaminophen (NORCO/VICODIN) 5-325 MG tablet Take 1 tablet by mouth every 8 (eight) hours as needed.  Marland Kitchen ibuprofen (ADVIL,MOTRIN) 800 MG tablet Take 800 mg by mouth 3 (three) times daily as needed.  Marland Kitchen lisinopril (PRINIVIL,ZESTRIL) 20 MG tablet Take 20 mg by mouth daily.   . methocarbamol (ROBAXIN) 500 MG tablet Take 1 tablet (500 mg total) by mouth every 6 (six) hours as needed for muscle spasms.  . methylPREDNISolone (MEDROL DOSEPAK) 4 MG TBPK tablet TAKE AS DIRECTED FOR 6 DAYS  . Multiple Vitamin  (MULTIVITAMIN WITH MINERALS) TABS Take 1 tablet by mouth daily.  Marland Kitchen omeprazole (PRILOSEC) 20 MG capsule Take 1 capsule (20 mg total) by mouth daily.  . ondansetron (ZOFRAN) 4 MG tablet Take 1 tablet (4 mg total) by mouth every 8 (eight) hours as needed for nausea or vomiting.  . Oxycodone HCl 10 MG TABS TAKE 1 TABLET BY MOUTH EVERY 4 TO 6 HOURS FOR 10 DAYS  . oxyCODONE-acetaminophen (ROXICET) 5-325 MG tablet Take 1-2 tablets by mouth every 4 (four) hours as needed for severe pain.  Marland Kitchen Phenyleph-Doxylamine-DM-APAP (ALKA SELTZER PLUS PO) Take 1 tablet by mouth daily as needed (heartburn).  . sucralfate (CARAFATE) 1 G tablet Take 1 tablet (1 g total) by mouth 4 (four) times daily -  with meals and at bedtime.  . traMADol (ULTRAM) 50 MG tablet Take 1 tablet (50 mg total) by mouth every 12 (twelve) hours as needed.  . vitamin B-12 (CYANOCOBALAMIN) 500 MCG tablet Take 500 mcg by mouth daily.  . vitamin C (ASCORBIC ACID) 500 MG tablet Take 500 mg by mouth daily.  . [DISCONTINUED] ciprofloxacin (CIPRO) 250 MG tablet Take 250 mg by mouth 2 (two) times daily.  . [DISCONTINUED] ciprofloxacin (CIPRO) 500 MG tablet Take 500 mg by mouth 2 (two) times daily.   No facility-administered encounter medications on file as of 03/23/2016.      Patient Active Problem List  Diagnosis Date Noted  . Right ankle pain 12/23/2015  . H/O arthroplasty   . Prosthetic joint infection ([place])   . Left knee pain   . Postoperative anemia due to acute blood loss 11/04/2015  . Pseudogout of knee 11/03/2015  . Abnormality of gait 07/10/2015  . Morbid obesity ([place]) 07/10/2015  . Primary localized osteoarthritis of left knee 11/27/2014  . Arthritis of knee 04/09/2014  . Constipation due to opioid therapy 02/13/2014  . Gastroesophageal reflux disease without esophagitis 02/13/2014     Health Maintenance Due  Topic Date Due  . FOOT EXAM  01/13/1960  . OPHTHALMOLOGY EXAM  01/13/1960  . TETANUS/TDAP  01/12/1969  . MAMMOGRAM   01/13/2000  . ZOSTAVAX  01/12/2010  . DEXA SCAN  01/13/2015     Review of Systems  Physical Exam   BP (!) 162/75   Pulse 77   Wt 250 lb (113.4 kg)   BMI 47.24 kg/m   Physical Exam  Constitutional:  oriented to person, place, and time. appears well-developed and well-nourished. No distress.  HENT: [place]/AT, PERRLA, no scleral icterus Mouth/Throat: Oropharynx is clear and moist. No oropharyngeal exudate.  Cardiovascular: Normal rate, regular rhythm and normal heart sounds. Exam reveals no gallop and no friction rub.  No murmur heard.  Pulmonary/Chest: Effort normal and breath sounds normal. No respiratory distress.  has no wheezes.  Ext: left knee slightly enlarged in comparison to right knee Skin: Skin is warm and dry. No rash noted. No erythema.  Psychiatric: a normal mood and affect.  behavior is normal.   CBC Lab Results  Component Value Date   WBC 4.0 01/22/2016   RBC 4.45 01/22/2016   HGB 12.7 01/22/2016   HCT 38.9 01/22/2016   PLT 262 01/22/2016   MCV 87.4 01/22/2016   MCH 28.5 01/22/2016   MCHC 32.6 01/22/2016   RDW 13.7 01/22/2016   LYMPHSABS 1,320 01/22/2016   MONOABS 280 01/22/2016   EOSABS 80 01/22/2016   BASOSABS 0 01/22/2016   BMET Lab Results  Component Value Date   NA 142 01/22/2016   K 4.5 01/22/2016   CL 105 01/22/2016   CO2 29 01/22/2016   GLUCOSE 95 01/22/2016   BUN 11 01/22/2016   CREATININE 0.65 01/22/2016   CALCIUM 9.2 01/22/2016   GFRNONAA >60 11/04/2015   GFRAA >60 11/04/2015     Assessment and Plan  Left prosthetic joint infection = will continue with amox til end December. Plan to check inflammatory markers today  Right knee pain = tka in 2015. Will check plain films. To follow up with dr. Percell Miller next week

## 2016-03-24 LAB — SEDIMENTATION RATE: Sed Rate: 6 mm/hr (ref 0–30)

## 2016-03-24 LAB — C-REACTIVE PROTEIN: CRP: 6.3 mg/L (ref <8.0)

## 2016-03-25 ENCOUNTER — Telehealth: Payer: Self-pay

## 2016-03-25 NOTE — Telephone Encounter (Signed)
Patient is calling regarding a message left on her voicemail from Dr Baxter Flattery earlier today explaining abnormal xray.     I will have Dr Baxter Flattery contact patient.   Laverle Patter, RN

## 2016-03-31 DIAGNOSIS — T8489XD Other specified complication of internal orthopedic prosthetic devices, implants and grafts, subsequent encounter: Secondary | ICD-10-CM | POA: Diagnosis not present

## 2016-04-12 ENCOUNTER — Other Ambulatory Visit: Payer: Self-pay | Admitting: Orthopedic Surgery

## 2016-04-12 DIAGNOSIS — M19042 Primary osteoarthritis, left hand: Secondary | ICD-10-CM | POA: Diagnosis not present

## 2016-04-12 DIAGNOSIS — M19041 Primary osteoarthritis, right hand: Secondary | ICD-10-CM | POA: Diagnosis not present

## 2016-04-12 DIAGNOSIS — M25512 Pain in left shoulder: Secondary | ICD-10-CM | POA: Diagnosis not present

## 2016-04-20 ENCOUNTER — Ambulatory Visit
Admission: RE | Admit: 2016-04-20 | Discharge: 2016-04-20 | Disposition: A | Discharge status: Home / Self Care | Payer: Medicare Other | Source: Ambulatory Visit | Attending: Orthopedic Surgery | Admitting: Orthopedic Surgery

## 2016-04-20 DIAGNOSIS — M25512 Pain in left shoulder: Secondary | ICD-10-CM

## 2016-04-20 DIAGNOSIS — S46012A Strain of muscle(s) and tendon(s) of the rotator cuff of left shoulder, initial encounter: Secondary | ICD-10-CM | POA: Diagnosis not present

## 2016-05-10 ENCOUNTER — Other Ambulatory Visit: Payer: Self-pay | Admitting: Internal Medicine

## 2016-05-11 NOTE — Telephone Encounter (Signed)
Last note states to continue Amoxicillin through December 2017.   Do you want to refill ?

## 2016-05-14 ENCOUNTER — Telehealth: Payer: Self-pay | Admitting: *Deleted

## 2016-05-14 NOTE — Telephone Encounter (Signed)
Call from [facility] pharmacist requesting a refill on the amoxicillin and per Dr Storm Frisk last office note this medication was to continue until the end of December. Refill denied and pharmacist will notify the patient. Jessica Good

## 2016-05-17 DIAGNOSIS — M25512 Pain in left shoulder: Secondary | ICD-10-CM | POA: Diagnosis not present

## 2016-05-27 ENCOUNTER — Ambulatory Visit: Payer: Medicare Other | Admitting: Internal Medicine

## 2016-06-17 ENCOUNTER — Ambulatory Visit (INDEPENDENT_AMBULATORY_CARE_PROVIDER_SITE_OTHER): Payer: Medicare Other | Admitting: Internal Medicine

## 2016-06-17 ENCOUNTER — Encounter: Payer: Self-pay | Admitting: Internal Medicine

## 2016-06-17 VITALS — BP 168/94 | HR 84 | Temp 98.2°F | Ht 61.0 in | Wt 256.0 lb

## 2016-06-17 DIAGNOSIS — T8450XS Infection and inflammatory reaction due to unspecified internal joint prosthesis, sequela: Secondary | ICD-10-CM | POA: Diagnosis not present

## 2016-06-17 IMAGING — CR DG KNEE 1-2V PORT*L*
2 series · 2 of 2 positions shown · non-contrast
Comparison: None.

CLINICAL DATA: Status post medial joint compartment prosthesis
placement

EXAM:
PORTABLE LEFT KNEE - 1-2 VIEW

[AP]
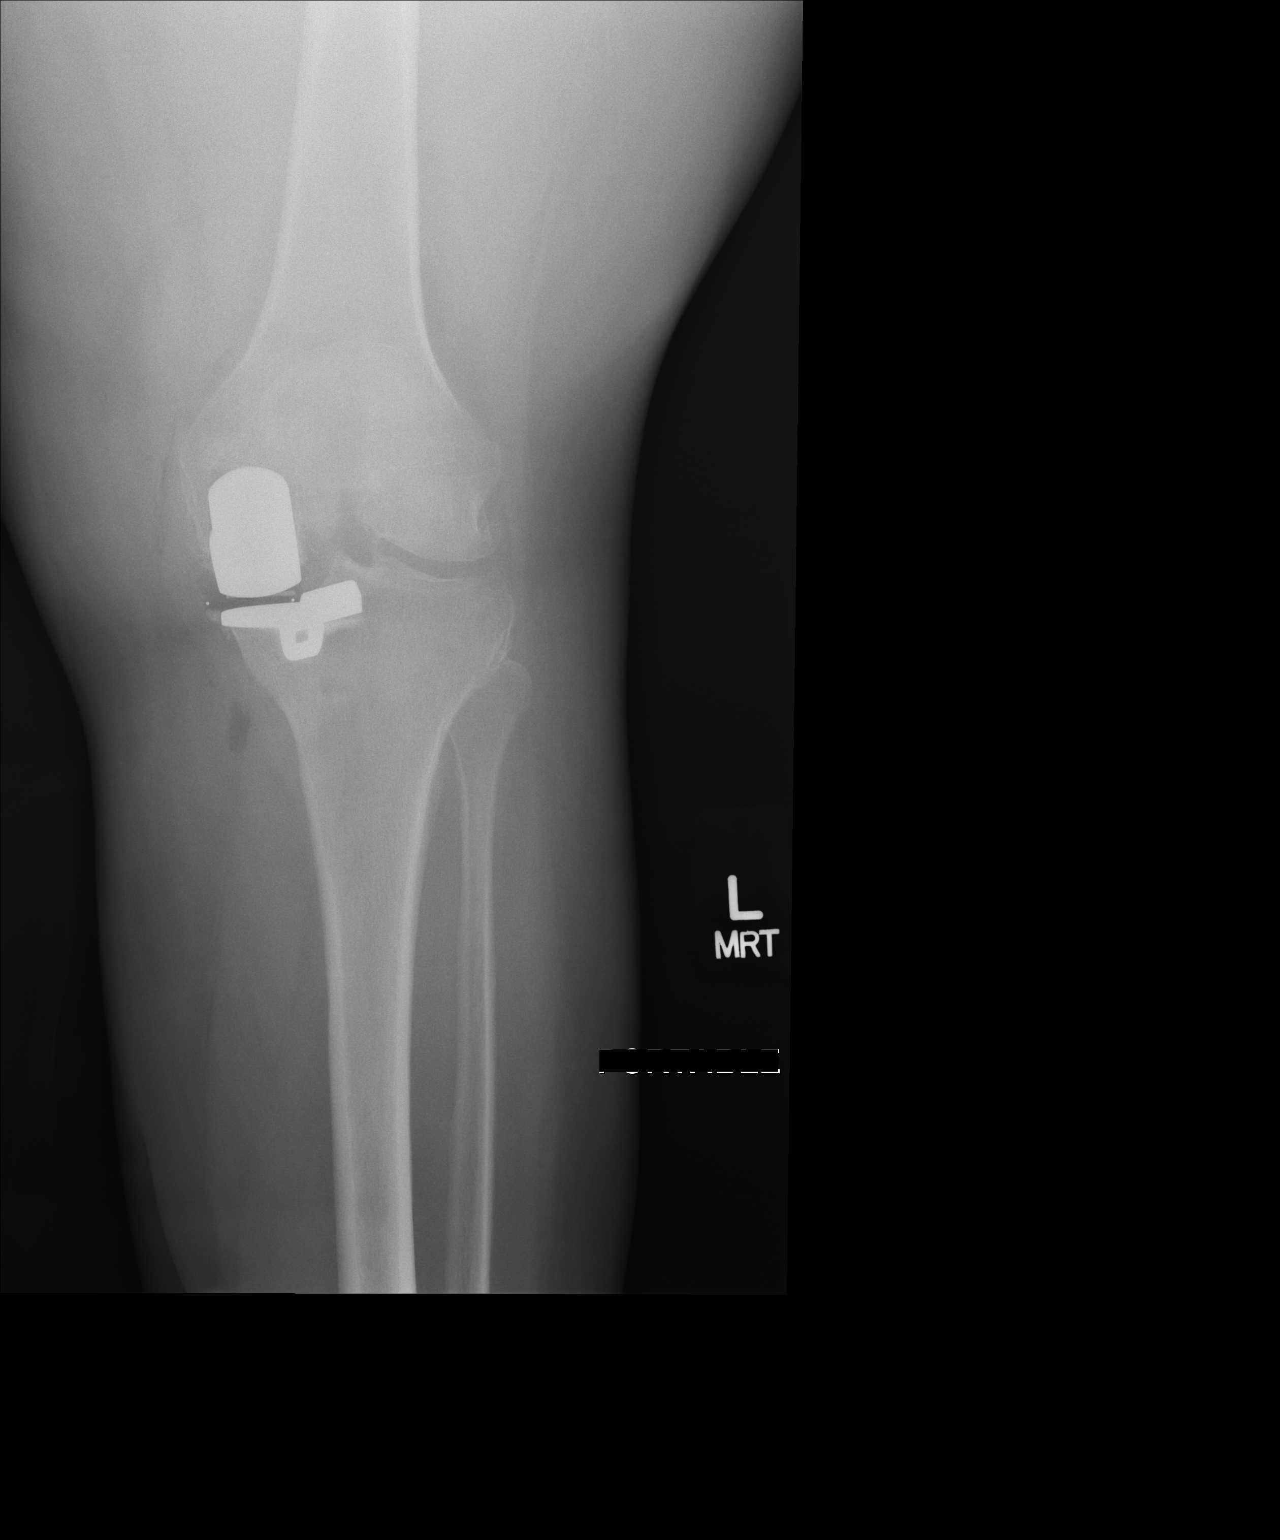

[xtable lateral]
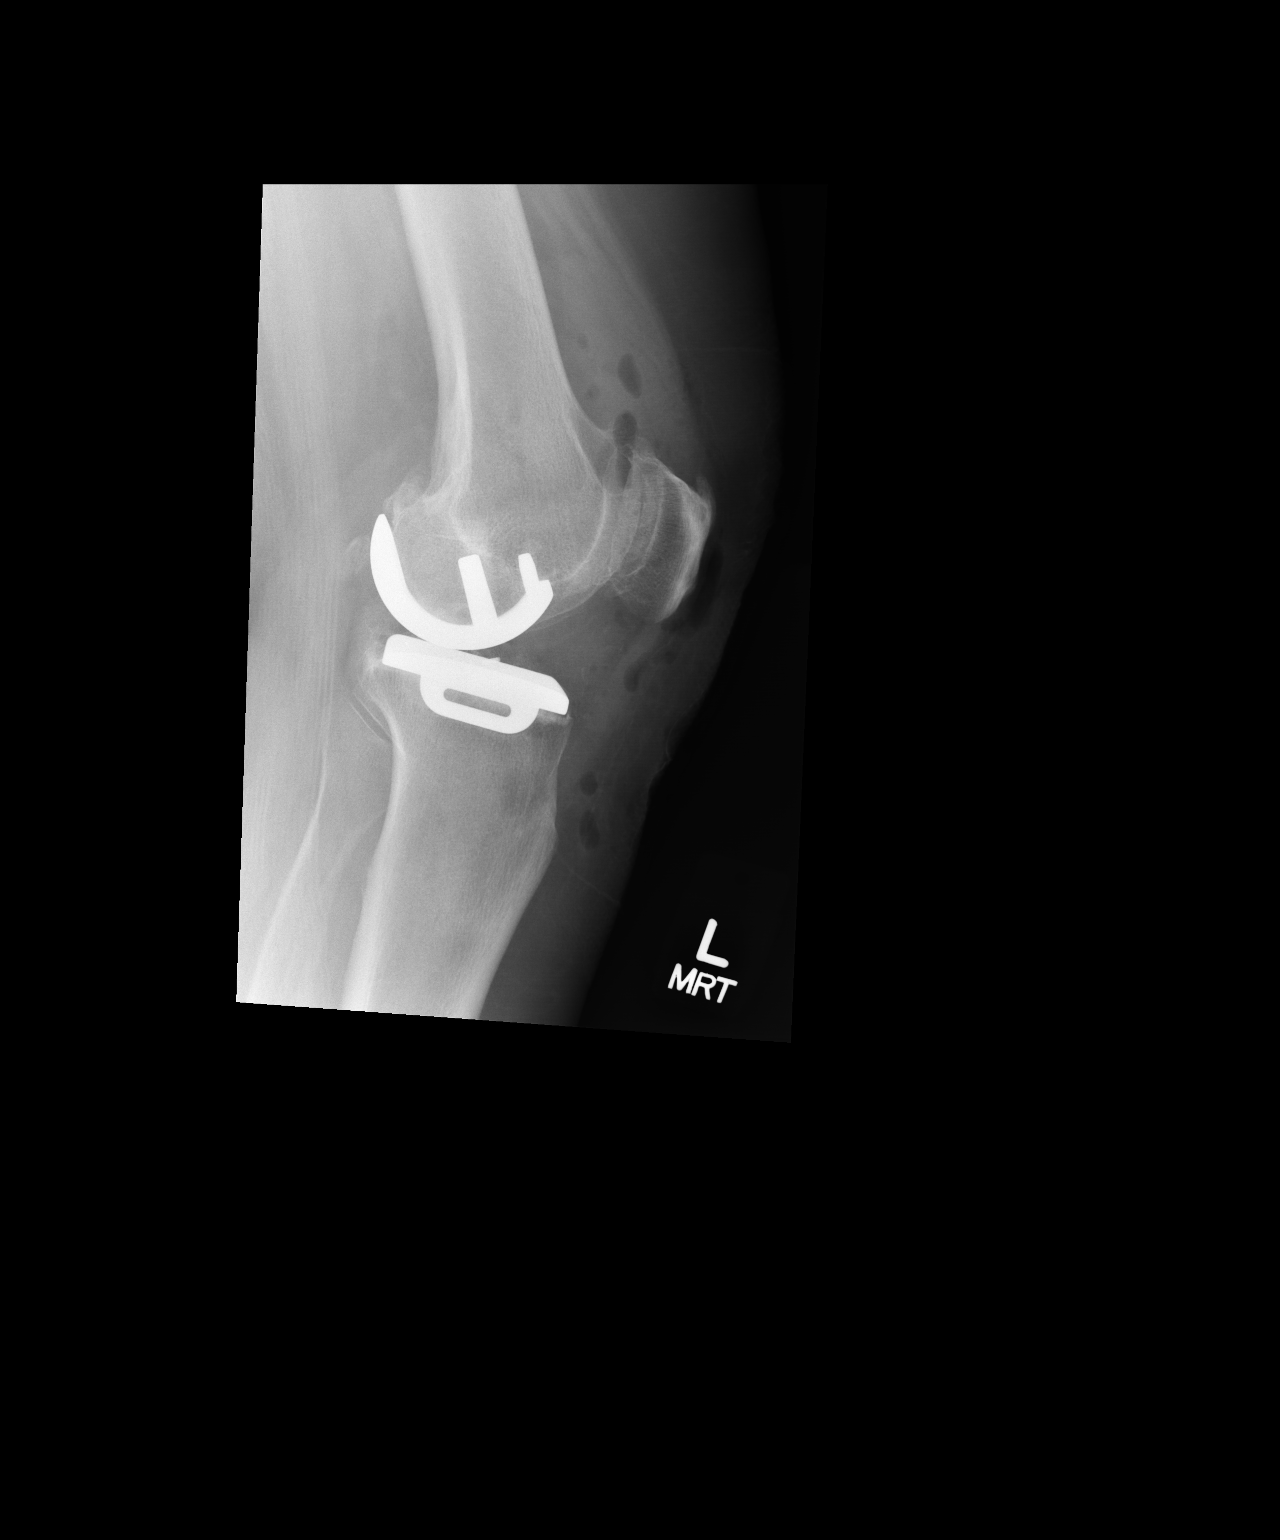

[2 of 2 positions shown; findings below may reference images not displayed]

FINDINGS: The patient has undergone placement of a prosthetic medial femoral
condyles and a prosthetic medial tibial plateau. Radiographic
positioning of the prosthetic structures is good. The lateral joint
space is reasonably well maintained. There is beaking of the tibial
spines. There is a small joint effusion and there is air in the
anterior soft tissues.
IMPRESSION: The patient has undergone unicompartmental left knee replacement.
There is no immediate postprocedure complication.

## 2016-06-17 NOTE — Progress Notes (Signed)
RFV: prosthetic joint infection  Patient ID: Jessica Good, female   DOB: Apr 27, 1951, 66 y.o.   MRN: [id number]  HPI Jessica Good is a 66yo F who has finished 6 month abtx course for strep viridans unicompartmental PJI on 6/26. She finished iv therapy then chronic suppression with amoxicillin through the end of December.   She has been off meds for roughly 6 wk. She has upcoming dental extraction and is concerned if she needs prophylaxis Outpatient Encounter Prescriptions as of 06/17/2016  Medication Sig  . acetaminophen (TYLENOL) 500 MG tablet Take 500 mg by mouth every 6 (six) hours as needed for moderate pain.  Marland Kitchen amoxicillin (AMOXIL) 500 MG capsule Take 1 capsule (500 mg total) by mouth 3 (three) times daily. Start taking on aug 9th  . aspirin 325 MG tablet Take 1 tablet (325 mg total) by mouth daily.  Marland Kitchen aspirin EC 325 MG tablet Take 1 tablet (325 mg total) by mouth daily.  . calcitRIOL (ROCALTROL) 0.25 MCG capsule TAKE 1 CAPSULE(S) TWICE A DAY BY ORAL ROUTE FOR 30 DAYS.  Marland Kitchen cholecalciferol (VITAMIN D) 1000 UNITS tablet Take 1,000 Units by mouth daily.  . diclofenac (VOLTAREN) 75 MG EC tablet Take 75 mg by mouth 2 (two) times daily.  . furosemide (LASIX) 40 MG tablet Take 40 mg by mouth daily as needed for fluid.   Marland Kitchen HYDROcodone-acetaminophen (NORCO/VICODIN) 5-325 MG tablet Take 1 tablet by mouth every 8 (eight) hours as needed.  Marland Kitchen ibuprofen (ADVIL,MOTRIN) 800 MG tablet Take 800 mg by mouth 3 (three) times daily as needed.  Marland Kitchen lisinopril (PRINIVIL,ZESTRIL) 20 MG tablet Take 20 mg by mouth daily.   . methocarbamol (ROBAXIN) 500 MG tablet Take 1 tablet (500 mg total) by mouth every 6 (six) hours as needed for muscle spasms.  . methylPREDNISolone (MEDROL DOSEPAK) 4 MG TBPK tablet TAKE AS DIRECTED FOR 6 DAYS  . Multiple Vitamin (MULTIVITAMIN WITH MINERALS) TABS Take 1 tablet by mouth daily.  Marland Kitchen omeprazole (PRILOSEC) 20 MG capsule Take 1 capsule (20 mg total) by mouth daily.  . ondansetron  (ZOFRAN) 4 MG tablet Take 1 tablet (4 mg total) by mouth every 8 (eight) hours as needed for nausea or vomiting.  . Oxycodone HCl 10 MG TABS TAKE 1 TABLET BY MOUTH EVERY 4 TO 6 HOURS FOR 10 DAYS  . oxyCODONE-acetaminophen (ROXICET) 5-325 MG tablet Take 1-2 tablets by mouth every 4 (four) hours as needed for severe pain.  Marland Kitchen Phenyleph-Doxylamine-DM-APAP (ALKA SELTZER PLUS PO) Take 1 tablet by mouth daily as needed (heartburn).  . sucralfate (CARAFATE) 1 G tablet Take 1 tablet (1 g total) by mouth 4 (four) times daily -  with meals and at bedtime.  . traMADol (ULTRAM) 50 MG tablet Take 1 tablet (50 mg total) by mouth every 12 (twelve) hours as needed.  . vitamin B-12 (CYANOCOBALAMIN) 500 MCG tablet Take 500 mcg by mouth daily.  . vitamin C (ASCORBIC ACID) 500 MG tablet Take 500 mg by mouth daily.   No facility-administered encounter medications on file as of 06/17/2016.      Patient Active Problem List   Diagnosis Date Noted  . Right ankle pain 12/23/2015  . H/O arthroplasty   . Prosthetic joint infection ([place])   . Left knee pain   . Postoperative anemia due to acute blood loss 11/04/2015  . Pseudogout of knee 11/03/2015  . Abnormality of gait 07/10/2015  . Morbid obesity ([place]) 07/10/2015  . Primary localized osteoarthritis of left knee 11/27/2014  . Arthritis  of knee 04/09/2014  . Constipation due to opioid therapy 02/13/2014  . Gastroesophageal reflux disease without esophagitis 02/13/2014     Health Maintenance Due  Topic Date Due  . FOOT EXAM  01/13/1960  . OPHTHALMOLOGY EXAM  01/13/1960  . TETANUS/TDAP  01/12/1969  . MAMMOGRAM  01/13/2000  . ZOSTAVAX  01/12/2010  . DEXA SCAN  01/13/2015  . HEMOGLOBIN A1C  04/27/2016     Review of Systems  Physical Exam  BP (!) 168/94   Pulse 84   Temp 98.2 F (36.8 C) (Oral)   Ht 5\' 1"  (1.549 m)   Wt 256 lb (116.1 kg)   BMI 48.37 kg/m  Physical Exam  Constitutional:  oriented to person, place, and time. appears well-developed and  well-nourished. No distress.  HENT: [place]/AT, PERRLA, no scleral icterus Mouth/Throat: Oropharynx is clear and moist. No oropharyngeal exudate.  Cardiovascular: Normal rate, regular rhythm and normal heart sounds. Exam reveals no gallop and no friction rub.  No murmur heard.  Pulmonary/Chest: Effort normal and breath sounds normal. No respiratory distress.  has no wheezes.  Skin: no warmth to knee or swelling Psychiatric: a normal mood and affect.  behavior is normal.   CBC Lab Results  Component Value Date   WBC 4.0 01/22/2016   RBC 4.45 01/22/2016   HGB 12.7 01/22/2016   HCT 38.9 01/22/2016   PLT 262 01/22/2016   MCV 87.4 01/22/2016   MCH 28.5 01/22/2016   MCHC 32.6 01/22/2016   RDW 13.7 01/22/2016   LYMPHSABS 1,320 01/22/2016   MONOABS 280 01/22/2016   EOSABS 80 01/22/2016    BMET Lab Results  Component Value Date   NA 142 01/22/2016   K 4.5 01/22/2016   CL 105 01/22/2016   CO2 29 01/22/2016   GLUCOSE 95 01/22/2016   BUN 11 01/22/2016   CREATININE 0.65 01/22/2016   CALCIUM 9.2 01/22/2016   GFRNONAA >60 11/04/2015   GFRAA >60 11/04/2015   Lab Results  Component Value Date   ESRSEDRATE 7 06/17/2016   Lab Results  Component Value Date   CRP 7.1 06/17/2016      Assessment and Plan  Prosthetic joint infection, sequelae = Exam looks great. Do not anticipate that we would need to restart chronic suppression. We will check sed rate nad crp.  Upcoming teeth extraction = would consider prophylaxis amox 500mg  x 1 before nad x 1 after

## 2016-06-18 LAB — C-REACTIVE PROTEIN: CRP: 7.1 mg/L (ref <8.0)

## 2016-06-18 LAB — SEDIMENTATION RATE: Sed Rate: 7 mm/hr (ref 0–30)

## 2016-07-29 DIAGNOSIS — M65312 Trigger thumb, left thumb: Secondary | ICD-10-CM | POA: Diagnosis not present

## 2016-08-04 ENCOUNTER — Telehealth: Payer: Self-pay | Admitting: Internal Medicine

## 2016-08-04 ENCOUNTER — Other Ambulatory Visit: Payer: Self-pay

## 2016-08-04 DIAGNOSIS — K625 Hemorrhage of anus and rectum: Secondary | ICD-10-CM

## 2016-08-04 NOTE — Telephone Encounter (Signed)
Left message for pt to call back. Lab order in epic.  Spoke with pt and she is aware. She will come for CBC tomorrow and we will call pt on Monday for an update. Pt knows if bleeding continues or worsens she should contact PCP or urgent care.

## 2016-08-04 NOTE — Telephone Encounter (Signed)
Pt states she had rectal bleeding several times yesterday, reports there was some clotted material there also on the tissue. Pt states it almost colored the toilet bowl red. Report she has had one episode today. Pt was supposed to have a recall EUS done in 01/2016 but did not schedule, stated she would call back to schedule. Pt is concerned now and wants to know what she should do. States she has a history of IBS and she has been having cramping in her lower abdominal area. Please advise as there are no midlevel appts available until the middle of the second week of April.

## 2016-08-04 NOTE — Telephone Encounter (Signed)
Given lack of appointments the remainder of this week, she may need to try to be seen by primary or urgent care for this issue if it continues. This may be hemorrhoidal in nature, but I do think it should be evaluated Please check CBC Please call patient on Monday for an update. I may be able to work her in next week as well if not seen by PCP this week EUS was in the stomach and would not explain rectal bleeding

## 2016-08-05 ENCOUNTER — Other Ambulatory Visit (INDEPENDENT_AMBULATORY_CARE_PROVIDER_SITE_OTHER): Payer: Medicare Other

## 2016-08-05 DIAGNOSIS — K625 Hemorrhage of anus and rectum: Secondary | ICD-10-CM | POA: Diagnosis not present

## 2016-08-05 LAB — CBC WITH DIFFERENTIAL/PLATELET
BASOS PCT: 0.8 % (ref 0.0–3.0)
Basophils Absolute: 0.1 10*3/uL (ref 0.0–0.1)
EOS PCT: 1.1 % (ref 0.0–5.0)
Eosinophils Absolute: 0.1 10*3/uL (ref 0.0–0.7)
HEMATOCRIT: 37.9 % (ref 36.0–46.0)
HEMOGLOBIN: 12.7 g/dL (ref 12.0–15.0)
LYMPHS PCT: 22.9 % (ref 12.0–46.0)
Lymphs Abs: 1.6 10*3/uL (ref 0.7–4.0)
MCHC: 33.6 g/dL (ref 30.0–36.0)
MCV: 90.8 fl (ref 78.0–100.0)
Monocytes Absolute: 0.4 10*3/uL (ref 0.1–1.0)
Monocytes Relative: 6 % (ref 3.0–12.0)
NEUTROS ABS: 4.7 10*3/uL (ref 1.4–7.7)
Neutrophils Relative %: 69.2 % (ref 43.0–77.0)
PLATELETS: 211 10*3/uL (ref 150.0–400.0)
RBC: 4.17 Mil/uL (ref 3.87–5.11)
RDW: 13.3 % (ref 11.5–15.5)
WBC: 6.8 10*3/uL (ref 4.0–10.5)

## 2016-08-09 NOTE — Telephone Encounter (Signed)
Agree this is most likely, however, I would like to be notified if she has any further rectal bleeding Also if she has abd pain, change in bowel habits, new diarrhea or constipation

## 2016-08-09 NOTE — Telephone Encounter (Signed)
Pt aware. States she is having some bloating and discomfort in her lower abdomen.

## 2016-08-09 NOTE — Telephone Encounter (Signed)
Would ask her to come tomorrow in 845 slot if still available If she cannot then she can be schedule with app or next available

## 2016-08-09 NOTE — Telephone Encounter (Signed)
Pt states she has not had any more bleeding since the second day, thinks it was an internal hemorrhoid. Pt wants to know if Dr. Hilarie Fredrickson would still want to see her. Please advise.

## 2016-08-09 NOTE — Telephone Encounter (Signed)
Another opening on schedule for tomorrow at 10:30am. Pt scheduled to see Dr. Hilarie Fredrickson tomorrow at 10:30am, she is aware of appt.

## 2016-08-10 ENCOUNTER — Encounter: Payer: Self-pay | Admitting: Internal Medicine

## 2016-08-10 ENCOUNTER — Ambulatory Visit (INDEPENDENT_AMBULATORY_CARE_PROVIDER_SITE_OTHER): Payer: Medicare Other | Admitting: Internal Medicine

## 2016-08-10 VITALS — BP 144/100 | HR 76 | Ht 61.5 in | Wt 261.5 lb

## 2016-08-10 DIAGNOSIS — K921 Melena: Secondary | ICD-10-CM | POA: Diagnosis not present

## 2016-08-10 DIAGNOSIS — K219 Gastro-esophageal reflux disease without esophagitis: Secondary | ICD-10-CM | POA: Diagnosis not present

## 2016-08-10 DIAGNOSIS — R1013 Epigastric pain: Secondary | ICD-10-CM | POA: Diagnosis not present

## 2016-08-10 DIAGNOSIS — K3189 Other diseases of stomach and duodenum: Secondary | ICD-10-CM

## 2016-08-10 MED ORDER — SUCRALFATE 1 GM/10ML PO SUSP: ORAL | 2 refills | Status: DC

## 2016-08-10 NOTE — Patient Instructions (Addendum)
We have sent the following medications to your pharmacy for you to pick up at your convenience: Carafate slurry three times daily before meals and bedtime as needed. If this gets no better in 1 week, call our office for heartburn medicine.  We will be in contact with you for endoscopy/colonoscopy and endoscopic ultrasound with Dr Ardis Hughs after he reviews your records.  If you are age 67 or older, your body mass index should be between 23-30. Your Body mass index is 48.61 kg/m. If this is out of the aforementioned range listed, please consider follow up with your Primary Care Provider.  If you are age 31 or younger, your body mass index should be between 19-25. Your Body mass index is 48.61 kg/m. If this is out of the aformentioned range listed, please consider follow up with your Primary Care Provider.   CC: Dr Ardis Hughs

## 2016-08-10 NOTE — Progress Notes (Signed)
Subjective:    Patient ID: Jessica Good, female    DOB: [birth year], 66 y.o.   MRN: [id number]  HPI Wallace Cogliano a 66 year old female with a history of GERD, adenomatous colon polyps, small submucosal lesion of the stomach incompletely characterized, who is worked in today after recent hematochezia. She is here today with her husband.  She reports that last week she developed 3-4 episodes of hematochezia. This started after having a "spell" the night before. She describes that these "spells" have happened 3-4 times over the past few years. She reports waking from night feeling very constipated with the urge to go to the bathroom. This is despite not being constipated the days previously. She states that she felt the urge to go to the bathroom and went and sat on the toilet for a prolonged period of time. She strained and was eventually able to pass a hard stool. During this time she reports a feeling "comes over her" and she feels clammy, sweaty and she felt burning in her extremities. She felt almost as if she would pass out. She did not have abdominal pain during this time. Slowly this past and she went back to bed. The next day she developed more frequent loose stools with red blood, maroon blood and clots. This occurred again 3-4 times that day and no further bleeding since. Her stools have yet to return to normal and they've been looser and slightly more frequent. Her stomach feels "unsettled" almost as if "I had a virus". She has never had rectal bleeding before. The other spells which she describes did not include loose stools or bleeding. Prior to this episode she was having normal formed bowel movements about once daily.  Separate from this she's noticed increase over the last 2-3 weeks in her reflux and indigestion. She started Relafen 6 weeks ago for arthritis. She had been off both PPI and Carafate for some time and doing well. This weekend, 2 days ago, she resume Carafate slurry before  meals. It has improved but not completely resolved her reflux and burning chest discomfort.  She denies nausea, vomiting. No fever or chills.  She did have an infected joint hardware last year and required surgery and prolonged antibiotics through a PICC line.  Her last colonoscopy which we reviewed today revealed one small adenoma but no evidence of diverticulosis or hemorrhoids at that time. This was November 2015  Review of Systems As per history of present illness, otherwise negative  Current Medications, Allergies, Past Medical History, Past Surgical History, Family History and Social History were reviewed in [employer] record.     Objective:   Physical Exam BP (!) 144/100 (BP Location: Left Arm, Patient Position: Sitting, Cuff Size: Large)   Pulse 76   Ht 5' 1.5" (1.562 m)   Wt 261 lb 8 oz (118.6 kg)   BMI 48.61 kg/m  Constitutional: Well-developed and well-nourished. No distress. HEENT: Normocephalic and atraumatic.  Conjunctivae are normal.  No scleral icterus. Neck: Neck supple. Trachea midline. Cardiovascular: Normal rate, regular rhythm and intact distal pulses. No M/R/G Pulmonary/chest: Effort normal and breath sounds normal. No wheezing, rales or rhonchi. Abdominal: Soft, Obese, mild tenderness in the left upper and middle quadrant without rebound or guarding, nondistended. Bowel sounds active throughout. There are no masses palpable.  Extremities: no clubbing, cyanosis, or edema Neurological: Alert and oriented to person place and time. Skin: Skin is warm and dry.  Psychiatric: Normal mood and affect. Behavior is normal.  CBC    Component Value Date/Time   WBC 6.8 08/05/2016 1618   RBC 4.17 08/05/2016 1618   HGB 12.7 08/05/2016 1618   HCT 37.9 08/05/2016 1618   PLT 211.0 08/05/2016 1618   MCV 90.8 08/05/2016 1618   MCH 28.5 01/22/2016 1130   MCHC 33.6 08/05/2016 1618   RDW 13.3 08/05/2016 1618   LYMPHSABS 1.6 08/05/2016 1618    MONOABS 0.4 08/05/2016 1618   EOSABS 0.1 08/05/2016 1618   BASOSABS 0.1 08/05/2016 1618       Assessment & Plan:  66 year old female with a history of GERD, adenomatous colon polyps, small submucosal lesion of the stomach incompletely characterized, who is worked in today after recent hematochezia.  1. Hematochezia -- I'm suspicious for an ischemic colitis episode brought on by what sounds like a vasovagal episode occurring in the middle of the night. She is mildly tender in the left upper quadrant which would be a watershed area of the colon. The bleeding has stopped and her blood counts are normal which is reassuring. She is very concerned about the bleeding and worried about underlying malignancy. I feel that this is unlikely and have provided reassurance today. After our discussion she would feel better if we repeat colonoscopy to exclude other pathology. We discussed the risks, benefits and alternatives and she wishes to proceed. I will asked Dr. Ardis Hughs to perform this at the time of her EUS, see below.  2. GERD/indigestion -- she has resumed Carafate which provided as much if not more benefit for her in the past compared to PPI. She will continue Carafate 1 g 3 times a day before meals for the next several weeks. If she is not considerably better within 1 week she is asked to notify me and we would begin a PPI. Comfortable with this plan.  3. Submucosal gastric lesion -- seen at upper endoscopy and then EUS in 2016. Dr. Ardis Hughs recommended surveillance at one year. This was never performed as she had complication with joint surgery. I recommended that we repeat EUS, per Dr. Ardis Hughs recommendation, at this time. We discussed the risks, benefits and alternatives and she wishes to proceed. If she continues to have indigestion symptoms EGD can be performed at the same time.  25 minutes spent with the patient today. Greater than 50% was spent in counseling and coordination of care with the patient If  Dr. Ardis Hughs is in agreement with the above, she will be contacted to schedule these tests in the outpatient hospital setting

## 2016-08-12 DIAGNOSIS — K047 Periapical abscess without sinus: Secondary | ICD-10-CM | POA: Diagnosis not present

## 2016-08-12 DIAGNOSIS — N390 Urinary tract infection, site not specified: Secondary | ICD-10-CM | POA: Diagnosis not present

## 2016-08-12 NOTE — Progress Notes (Signed)
Jay, Happy to help.Thanks   Patty, She needs colonoscopy (rectal bleeding) and upper EUS (radial +/- linear for gastric lesion). Next available EUS Thursday with MAC sedation (not during hosp week)>  Thanks  DJ  

## 2016-08-13 ENCOUNTER — Other Ambulatory Visit: Payer: Self-pay

## 2016-08-13 ENCOUNTER — Telehealth: Payer: Self-pay

## 2016-08-13 DIAGNOSIS — K319 Disease of stomach and duodenum, unspecified: Secondary | ICD-10-CM

## 2016-08-13 DIAGNOSIS — K625 Hemorrhage of anus and rectum: Secondary | ICD-10-CM

## 2016-08-13 MED ORDER — NA SULFATE-K SULFATE-MG SULF 17.5-3.13-1.6 GM/177ML PO SOLN: 1.0000 | Freq: Once | ORAL | 0 refills | Status: AC

## 2016-08-13 NOTE — Telephone Encounter (Signed)
EUS Colon scheduled, pt instructed and medications reviewed.  Patient instructions mailed to home.  Patient to call with any questions or concerns.  ? ?

## 2016-08-13 NOTE — Telephone Encounter (Signed)
Ulice Dash, Happy to help.Thanks   Sheetal Lyall, She needs colonoscopy (rectal bleeding) and upper EUS (radial +/- linear for gastric lesion). Next available EUS Thursday with MAC sedation (not during hosp week)>  Thanks  DJ

## 2016-08-13 NOTE — Telephone Encounter (Signed)
-----   Message from Milus Banister, MD sent at 08/12/2016  7:40 AM EDT -----   ----- Message ----- From: Jerene Bears, MD Sent: 08/10/2016   1:49 PM To: Milus Banister, MD  Dan This is a patient of mine, that you helped with in 2016.  You recommended EUS in 1 year to re-eval a submucosal gastric lesion She wishes to proceed with this. She also had recent hematochezia, now resolved. Suspicious for possibly ischemic colitis episode. She would prefer evaluation with colonoscopy for reassurance and to rule out other pathology. I think this is reasonable. Would you be willing to perform EUS for the gastric submucosal lesion and colonoscopy to evaluate hematochezia at the same time in the outpatient hospital setting on one of your Thursdays? Thanks for your help Ulice Dash

## 2016-08-17 ENCOUNTER — Telehealth: Payer: Self-pay

## 2016-08-17 NOTE — Telephone Encounter (Signed)
Per verbal order from Dr Ardis Hughs pt needs to be moved to 09/02/16 730 am d/t an emergency case being added to 08/26/16. Left message on machine to call back

## 2016-08-18 NOTE — Telephone Encounter (Signed)
I spoke with the pt and advised her of the new date and time for procedure at [facility].  We discussed the instruction change and pt verbalized understanding.

## 2016-08-20 DIAGNOSIS — Z13228 Encounter for screening for other metabolic disorders: Secondary | ICD-10-CM | POA: Diagnosis not present

## 2016-08-20 DIAGNOSIS — Z0000 Encounter for general adult medical examination without abnormal findings: Secondary | ICD-10-CM | POA: Diagnosis not present

## 2016-08-20 DIAGNOSIS — M8580 Other specified disorders of bone density and structure, unspecified site: Secondary | ICD-10-CM | POA: Diagnosis not present

## 2016-08-20 DIAGNOSIS — K219 Gastro-esophageal reflux disease without esophagitis: Secondary | ICD-10-CM | POA: Diagnosis not present

## 2016-08-20 DIAGNOSIS — Z1211 Encounter for screening for malignant neoplasm of colon: Secondary | ICD-10-CM | POA: Diagnosis not present

## 2016-08-20 DIAGNOSIS — Z79899 Other long term (current) drug therapy: Secondary | ICD-10-CM | POA: Diagnosis not present

## 2016-08-20 DIAGNOSIS — R5383 Other fatigue: Secondary | ICD-10-CM | POA: Diagnosis not present

## 2016-08-31 ENCOUNTER — Other Ambulatory Visit: Payer: Self-pay | Admitting: *Deleted

## 2016-08-31 ENCOUNTER — Encounter (HOSPITAL_COMMUNITY): Payer: Self-pay | Admitting: *Deleted

## 2016-08-31 ENCOUNTER — Telehealth: Payer: Self-pay | Admitting: Gastroenterology

## 2016-08-31 DIAGNOSIS — M8580 Other specified disorders of bone density and structure, unspecified site: Secondary | ICD-10-CM

## 2016-08-31 NOTE — Telephone Encounter (Signed)
The pt rescheduled her appt at [facility] and needed to be re instructed.  We went over her instructions and she verbalized understanding.  She will call with any further questions

## 2016-09-02 ENCOUNTER — Ambulatory Visit (HOSPITAL_COMMUNITY): Payer: Medicare Other | Admitting: Certified Registered Nurse Anesthetist

## 2016-09-02 ENCOUNTER — Encounter (HOSPITAL_COMMUNITY)
Admission: RE | Disposition: A | Discharge status: Home / Self Care | Payer: Self-pay | Source: Ambulatory Visit | Attending: Gastroenterology

## 2016-09-02 ENCOUNTER — Ambulatory Visit (HOSPITAL_COMMUNITY)
Admission: RE | Admit: 2016-09-02 | Discharge: 2016-09-02 | Disposition: A | Discharge status: Home / Self Care | Payer: Medicare Other | Source: Ambulatory Visit | Attending: Gastroenterology | Admitting: Gastroenterology

## 2016-09-02 ENCOUNTER — Encounter (HOSPITAL_COMMUNITY): Payer: Self-pay

## 2016-09-02 DIAGNOSIS — K5900 Constipation, unspecified: Secondary | ICD-10-CM | POA: Diagnosis not present

## 2016-09-02 DIAGNOSIS — K219 Gastro-esophageal reflux disease without esophagitis: Secondary | ICD-10-CM | POA: Insufficient documentation

## 2016-09-02 DIAGNOSIS — Z86010 Personal history of colonic polyps: Secondary | ICD-10-CM | POA: Diagnosis not present

## 2016-09-02 DIAGNOSIS — K319 Disease of stomach and duodenum, unspecified: Secondary | ICD-10-CM | POA: Insufficient documentation

## 2016-09-02 DIAGNOSIS — Z79899 Other long term (current) drug therapy: Secondary | ICD-10-CM | POA: Insufficient documentation

## 2016-09-02 DIAGNOSIS — I10 Essential (primary) hypertension: Secondary | ICD-10-CM | POA: Diagnosis not present

## 2016-09-02 DIAGNOSIS — K3189 Other diseases of stomach and duodenum: Secondary | ICD-10-CM

## 2016-09-02 DIAGNOSIS — Z6841 Body Mass Index (BMI) 40.0 and over, adult: Secondary | ICD-10-CM | POA: Insufficient documentation

## 2016-09-02 DIAGNOSIS — K625 Hemorrhage of anus and rectum: Secondary | ICD-10-CM

## 2016-09-02 DIAGNOSIS — K921 Melena: Secondary | ICD-10-CM | POA: Diagnosis not present

## 2016-09-02 HISTORY — PX: EUS: SHX5427

## 2016-09-02 HISTORY — DX: Personal history of urinary (tract) infections: Z87.440

## 2016-09-02 HISTORY — DX: Prediabetes: R73.03

## 2016-09-02 HISTORY — PX: COLONOSCOPY WITH PROPOFOL: SHX5780

## 2016-09-02 HISTORY — DX: Interstitial cystitis (chronic) without hematuria: N30.10

## 2016-09-02 LAB — GLUCOSE, CAPILLARY: Glucose-Capillary: 120 mg/dL — ABNORMAL HIGH (ref 65–99)

## 2016-09-02 SURGERY — COLONOSCOPY WITH PROPOFOL
Anesthesia: Monitor Anesthesia Care

## 2016-09-02 MED ORDER — PROPOFOL 10 MG/ML IV BOLUS
INTRAVENOUS | Status: DC | PRN
  Administered 2016-09-02: 20 mg via INTRAVENOUS

## 2016-09-02 MED ORDER — LIDOCAINE 2% (20 MG/ML) 5 ML SYRINGE
INTRAMUSCULAR | Status: AC
  Filled 2016-09-02: qty 5

## 2016-09-02 MED ORDER — SODIUM CHLORIDE 0.9 % IV SOLN: INTRAVENOUS | Status: DC

## 2016-09-02 MED ORDER — PROPOFOL 10 MG/ML IV BOLUS
INTRAVENOUS | Status: AC
  Filled 2016-09-02: qty 40

## 2016-09-02 MED ORDER — LIDOCAINE 2% (20 MG/ML) 5 ML SYRINGE
INTRAMUSCULAR | Status: DC | PRN
  Administered 2016-09-02: 100 mg via INTRAVENOUS

## 2016-09-02 MED ORDER — ONDANSETRON HCL 4 MG/2ML IJ SOLN
INTRAMUSCULAR | Status: AC
  Filled 2016-09-02: qty 2

## 2016-09-02 MED ORDER — LACTATED RINGERS IV SOLN
INTRAVENOUS | Status: DC
  Administered 2016-09-02: 07:00:00 via INTRAVENOUS
  Administered 2016-09-02: 1000 mL via INTRAVENOUS

## 2016-09-02 MED ORDER — PROPOFOL 10 MG/ML IV BOLUS
INTRAVENOUS | Status: AC
  Filled 2016-09-02: qty 20

## 2016-09-02 MED ORDER — ONDANSETRON HCL 4 MG/2ML IJ SOLN
INTRAMUSCULAR | Status: DC | PRN
  Administered 2016-09-02: 4 mg via INTRAVENOUS

## 2016-09-02 MED ORDER — PROPOFOL 500 MG/50ML IV EMUL
INTRAVENOUS | Status: DC | PRN
  Administered 2016-09-02: 100 ug/kg/min via INTRAVENOUS

## 2016-09-02 SURGICAL SUPPLY — 22 items

## 2016-09-02 NOTE — Anesthesia Postprocedure Evaluation (Addendum)
Anesthesia Post Note  Patient: Jessica Good  Procedure(s) Performed: Procedure(s) (LRB): COLONOSCOPY WITH PROPOFOL (N/A) UPPER ENDOSCOPIC ULTRASOUND (EUS) LINEAR (N/A)  Patient location during evaluation: PACU Anesthesia Type: MAC Level of consciousness: awake and alert Pain management: pain level controlled Vital Signs Assessment: post-procedure vital signs reviewed and stable Respiratory status: spontaneous breathing, nonlabored ventilation, respiratory function stable and patient connected to nasal cannula oxygen Cardiovascular status: stable and blood pressure returned to baseline Anesthetic complications: no       Last Vitals:  Vitals:   09/02/16 0816 09/02/16 0820  BP: (!) 142/45 (!) 150/62  Pulse: 76   Resp: 15   Temp: 36.4 C     Last Pain:  Vitals:   09/02/16 0816  TempSrc: Oral                 Erastus Bartolomei S

## 2016-09-02 NOTE — Op Note (Addendum)
[facility] Patient Name: Jessica Good Procedure Date: 09/02/2016 MRN: [id number] Attending MD: Milus Banister , MD Date of Birth: 1950-09-04 CSN: [id number] Age: 66 Admit Type: Outpatient Procedure:                Colonoscopy Indications:              Hematochezia; colonoscopy 03/2014 Dr. Hilarie Fredrickson for                            routine screening found one subCM TA Providers:                Milus Banister, MD, Cleda Daub, RN, William Dalton, Technician Referring MD:              Medicines:                Monitored Anesthesia Care Complications:            No immediate complications. Estimated blood loss:                            None. Estimated Blood Loss:     Estimated blood loss: none. Procedure:                Pre-Anesthesia Assessment:                           - Prior to the procedure, a History and Physical                            was performed, and patient medications and                            allergies were reviewed. The patient's tolerance of                            previous anesthesia was also reviewed. The risks                            and benefits of the procedure and the sedation                            options and risks were discussed with the patient.                            All questions were answered, and informed consent                            was obtained. Prior Anticoagulants: The patient has                            taken no previous anticoagulant or antiplatelet                            agents. ASA Grade  Assessment: II - A patient with                            mild systemic disease. After reviewing the risks                            and benefits, the patient was deemed in                            satisfactory condition to undergo the procedure.                           After obtaining informed consent, the colonoscope                            was passed under direct vision.  Throughout the                            procedure, the patient's blood pressure, pulse, and                            oxygen saturations were monitored continuously. The                            EC-[id number] ([id number]) scope was introduced through                            the anus and advanced to the the cecum, identified                            by appendiceal orifice and ileocecal valve. The                            colonoscopy was performed without difficulty. The                            patient tolerated the procedure well. The quality                            of the bowel preparation was good. The ileocecal                            valve, appendiceal orifice, and rectum were                            photographed. Scope In: 7:42:20 AM Scope Out: 7:50:49 AM Scope Withdrawal Time: 0 hours 6 minutes 45 seconds  Total Procedure Duration: 0 hours 8 minutes 29 seconds  Findings:      The entire examined colon appeared normal on direct and retroflexion       views. Impression:               - The entire examined colon is normal on direct and  retroflexion views.                           - No specimens collected. Moderate Sedation:      N/A- Per Anesthesia Care Recommendation:           - Patient has a contact number available for                            emergencies. The signs and symptoms of potential                            delayed complications were discussed with the                            patient. Return to normal activities tomorrow.                            Written discharge instructions were provided to the                            patient.                           - Resume previous diet.                           - Continue present medications.                           - Repeat colonoscopy in 3 - 5 years for                            surveillance (per Dr. Hilarie Fredrickson, given h/o 2015 subCM                             TA). Procedure Code(s):        --- Professional ---                           [phone], Colonoscopy, flexible; diagnostic, including                            collection of specimen(s) by brushing or washing,                            when performed (separate procedure) Diagnosis Code(s):        --- Professional ---                           K92.1, Melena (includes Hematochezia) CPT copyright 2016 American Medical Association. All rights reserved. The codes documented in this report are preliminary and upon coder review may  be revised to meet current compliance requirements. Milus Banister, MD 09/02/2016 7:54:39 AM This report has been signed electronically. Number of Addenda: 0

## 2016-09-02 NOTE — Transfer of Care (Signed)
Immediate Anesthesia Transfer of Care Note  Patient: Jessica Good  Procedure(s) Performed: Procedure(s): COLONOSCOPY WITH PROPOFOL (N/A) UPPER ENDOSCOPIC ULTRASOUND (EUS) LINEAR (N/A)  Patient Location: PACU and Endoscopy Unit  Anesthesia Type:MAC  Level of Consciousness: awake and patient cooperative  Airway & Oxygen Therapy: Patient Spontanous Breathing and Patient connected to nasal cannula oxygen  Post-op Assessment: Report given to RN and Post -op Vital signs reviewed and stable  Post vital signs: Reviewed and stable  Last Vitals:  Vitals:   09/02/16 0638  BP: (!) 154/70  Pulse: 67  Resp: 18  Temp: 36.4 C    Last Pain:  Vitals:   09/02/16 0638  TempSrc: Oral         Complications: No apparent anesthesia complications

## 2016-09-02 NOTE — Discharge Instructions (Signed)

## 2016-09-02 NOTE — Telephone Encounter (Addendum)
I have changed recall colonoscopy recall in EPIC to 08/2021.  ----- Message from Jerene Bears, MD sent at 09/02/2016  8:47 AM EDT ----- Thanks Linna Hoff I think she will be reassured. I do not know what was bleeding recently in her colon, but the exam findings are very reassuring.  Dottie Please change colon recall to April 2023 (hx of polyps)  Thanks Ulice Dash  ----- Message ----- From: Milus Banister, MD Sent: 09/02/2016   8:19 AM To: Jerene Bears, MD  Ulice Dash, Just completed colonoscopy and EUS. THe colonoscopy was normal.  She had colonoscopy 03/2014 with single small TA removed.  Probably has recall in system for 2020 but I think it's fine to push that out to 2023 or so after this exam.  The subepithelial gastric lesion noted 2 years ago has not grown and is actually measuring smaller on todays EUS exam.  I think it is highly unlikely to have any current or future clinical relevance and recommend no further surveillance testing  Thanks DJ

## 2016-09-02 NOTE — H&P (View-Only) (Signed)
Subjective:    Patient ID: Jessica Good, female    DOB: Jun 23, 1950, 66 y.o.   MRN: [id number]  HPI Jessica Good a 66 year old female with a history of GERD, adenomatous colon polyps, small submucosal lesion of the stomach incompletely characterized, who is worked in today after recent hematochezia. She is here today with her husband.  She reports that last week she developed 3-4 episodes of hematochezia. This started after having a "spell" the night before. She describes that these "spells" have happened 3-4 times over the past few years. She reports waking from night feeling very constipated with the urge to go to the bathroom. This is despite not being constipated the days previously. She states that she felt the urge to go to the bathroom and went and sat on the toilet for a prolonged period of time. She strained and was eventually able to pass a hard stool. During this time she reports a feeling "comes over her" and she feels clammy, sweaty and she felt burning in her extremities. She felt almost as if she would pass out. She did not have abdominal pain during this time. Slowly this past and she went back to bed. The next day she developed more frequent loose stools with red blood, maroon blood and clots. This occurred again 3-4 times that day and no further bleeding since. Her stools have yet to return to normal and they've been looser and slightly more frequent. Her stomach feels "unsettled" almost as if "I had a virus". She has never had rectal bleeding before. The other spells which she describes did not include loose stools or bleeding. Prior to this episode she was having normal formed bowel movements about once daily.  Separate from this she's noticed increase over the last 2-3 weeks in her reflux and indigestion. She started Relafen 6 weeks ago for arthritis. She had been off both PPI and Carafate for some time and doing well. This weekend, 2 days ago, she resume Carafate slurry before  meals. It has improved but not completely resolved her reflux and burning chest discomfort.  She denies nausea, vomiting. No fever or chills.  She did have an infected joint hardware last year and required surgery and prolonged antibiotics through a PICC line.  Her last colonoscopy which we reviewed today revealed one small adenoma but no evidence of diverticulosis or hemorrhoids at that time. This was November 2015  Review of Systems As per history of present illness, otherwise negative  Current Medications, Allergies, Past Medical History, Past Surgical History, Family History and Social History were reviewed in [employer] record.     Objective:   Physical Exam BP (!) 144/100 (BP Location: Left Arm, Patient Position: Sitting, Cuff Size: Large)   Pulse 76   Ht 5' 1.5" (1.562 m)   Wt 261 lb 8 oz (118.6 kg)   BMI 48.61 kg/m  Constitutional: Well-developed and well-nourished. No distress. HEENT: Normocephalic and atraumatic.  Conjunctivae are normal.  No scleral icterus. Neck: Neck supple. Trachea midline. Cardiovascular: Normal rate, regular rhythm and intact distal pulses. No M/R/G Pulmonary/chest: Effort normal and breath sounds normal. No wheezing, rales or rhonchi. Abdominal: Soft, Obese, mild tenderness in the left upper and middle quadrant without rebound or guarding, nondistended. Bowel sounds active throughout. There are no masses palpable.  Extremities: no clubbing, cyanosis, or edema Neurological: Alert and oriented to person place and time. Skin: Skin is warm and dry.  Psychiatric: Normal mood and affect. Behavior is normal.  CBC    Component Value Date/Time   WBC 6.8 08/05/2016 1618   RBC 4.17 08/05/2016 1618   HGB 12.7 08/05/2016 1618   HCT 37.9 08/05/2016 1618   PLT 211.0 08/05/2016 1618   MCV 90.8 08/05/2016 1618   MCH 28.5 01/22/2016 1130   MCHC 33.6 08/05/2016 1618   RDW 13.3 08/05/2016 1618   LYMPHSABS 1.6 08/05/2016 1618    MONOABS 0.4 08/05/2016 1618   EOSABS 0.1 08/05/2016 1618   BASOSABS 0.1 08/05/2016 1618       Assessment & Plan:  66 year old female with a history of GERD, adenomatous colon polyps, small submucosal lesion of the stomach incompletely characterized, who is worked in today after recent hematochezia.  1. Hematochezia -- I'm suspicious for an ischemic colitis episode brought on by what sounds like a vasovagal episode occurring in the middle of the night. She is mildly tender in the left upper quadrant which would be a watershed area of the colon. The bleeding has stopped and her blood counts are normal which is reassuring. She is very concerned about the bleeding and worried about underlying malignancy. I feel that this is unlikely and have provided reassurance today. After our discussion she would feel better if we repeat colonoscopy to exclude other pathology. We discussed the risks, benefits and alternatives and she wishes to proceed. I will asked Dr. Ardis Hughs to perform this at the time of her EUS, see below.  2. GERD/indigestion -- she has resumed Carafate which provided as much if not more benefit for her in the past compared to PPI. She will continue Carafate 1 g 3 times a day before meals for the next several weeks. If she is not considerably better within 1 week she is asked to notify me and we would begin a PPI. Comfortable with this plan.  3. Submucosal gastric lesion -- seen at upper endoscopy and then EUS in 2016. Dr. Ardis Hughs recommended surveillance at one year. This was never performed as she had complication with joint surgery. I recommended that we repeat EUS, per Dr. Ardis Hughs recommendation, at this time. We discussed the risks, benefits and alternatives and she wishes to proceed. If she continues to have indigestion symptoms EGD can be performed at the same time.  25 minutes spent with the patient today. Greater than 50% was spent in counseling and coordination of care with the patient If  Dr. Ardis Hughs is in agreement with the above, she will be contacted to schedule these tests in the outpatient hospital setting

## 2016-09-02 NOTE — Anesthesia Preprocedure Evaluation (Addendum)
Anesthesia Evaluation  Patient identified by MRN, date of birth, ID band Patient awake    Reviewed: Allergy & Precautions, NPO status , Patient's Chart, lab work & pertinent test results  Airway Mallampati: III  TM Distance: <3 FB Neck ROM: Full    Dental no notable dental hx.    Pulmonary neg pulmonary ROS,    Pulmonary exam normal breath sounds clear to auscultation       Cardiovascular hypertension, Normal cardiovascular exam Rhythm:Regular Rate:Normal     Neuro/Psych negative neurological ROS  negative psych ROS   GI/Hepatic Neg liver ROS, hiatal hernia, GERD  ,  Endo/Other  Morbid obesity  Renal/GU negative Renal ROS  negative genitourinary   Musculoskeletal negative musculoskeletal ROS (+)   Abdominal (+) + obese,   Peds negative pediatric ROS (+)  Hematology negative hematology ROS (+)   Anesthesia Other Findings   Reproductive/Obstetrics negative OB ROS                            Anesthesia Physical Anesthesia Plan  ASA: III  Anesthesia Plan: MAC   Post-op Pain Management:    Induction: Intravenous  Airway Management Planned: Nasal Cannula  Additional Equipment:   Intra-op Plan:   Post-operative Plan:   Informed Consent: I have reviewed the patients History and Physical, chart, labs and discussed the procedure including the risks, benefits and alternatives for the proposed anesthesia with the patient or authorized representative who has indicated his/her understanding and acceptance.   Dental advisory given  Plan Discussed with: CRNA and Surgeon  Anesthesia Plan Comments:         Anesthesia Quick Evaluation

## 2016-09-02 NOTE — Anesthesia Procedure Notes (Signed)
Procedure Name: MAC Date/Time: 09/02/2016 7:30 AM Performed by: West Pugh Pre-anesthesia Checklist: Patient identified, Emergency Drugs available, Suction available, Patient being monitored and Timeout performed Patient Re-evaluated:Patient Re-evaluated prior to inductionOxygen Delivery Method: Nasal cannula Placement Confirmation: CO2 detector Dental Injury: Teeth and Oropharynx as per pre-operative assessment  Comments: Patient has loose bottom teeth and poor dentition. Bite block placed by endo staff.

## 2016-09-02 NOTE — Addendum Note (Signed)
Addendum  created 09/02/16 0909 by West Pugh, CRNA   Anesthesia Staff edited

## 2016-09-02 NOTE — Op Note (Signed)
[facility] Patient Name: Jessica Good Procedure Date: 09/02/2016 MRN: [id number] Attending MD: Milus Banister , MD Date of Birth: [birth year] CSN: [id number] Age: 66 Admit Type: Outpatient Procedure:                Upper EUS Indications:              Gastric mucosal mass/polyp found on endoscopy; 2016                            EGD Dr. Hilarie Fredrickson found incidental subepithelial                            distal gastric lesion; EUS 01/2015 Dr. Ardis Hughs found                            the same; 5.59mm distal gastric lesion; here for                            surveilance exam Providers:                Milus Banister, MD, Cleda Daub, RN, William Dalton, Technician Referring MD:              Medicines:                Monitored Anesthesia Care Complications:            No immediate complications. Estimated blood loss:                            None. Estimated Blood Loss:     Estimated blood loss: none. Procedure:                Pre-Anesthesia Assessment:                           - Prior to the procedure, a History and Physical                            was performed, and patient medications and                            allergies were reviewed. The patient's tolerance of                            previous anesthesia was also reviewed. The risks                            and benefits of the procedure and the sedation                            options and risks were discussed with the patient.                            All questions were  answered, and informed consent                            was obtained. Prior Anticoagulants: The patient has                            taken no previous anticoagulant or antiplatelet                            agents. ASA Grade Assessment: III - A patient with                            severe systemic disease. After reviewing the risks                            and benefits, the patient was deemed in                            satisfactory condition to undergo the procedure.                           After obtaining informed consent, the endoscope was                            passed under direct vision. Throughout the                            procedure, the patient's blood pressure, pulse, and                            oxygen saturations were monitored continuously. The                            [id number] ([id number]) scope was introduced through                            the mouth, and advanced to the antrum of the                            stomach. The upper EUS was accomplished without                            difficulty. The patient tolerated the procedure                            well. Scope In: Scope Out: Findings:      Endoscopic Finding :      1. There was a small (5-45mm) subepithelial, smooth lesion in the distal       body of the stomach, along the lesser curvature. This appears unchaged       endoscopically compared to 2 years ago.      2. UGI tract otherwise normal.      Endosonographic Finding :      1. The subepithelial lesion described above correlates with an oval  intramural (subepithelial) lesion that is hypoechoic and isoechoic.       Sonographically, the lesion communicates with the deep mucosa (Layer 2)       and submucosa (Layer 3) layers. The lesion measured 4.8mm (in maximum       thickness). The outer endosonographic borders were poorly defined.      2. No perigastric adenopathy.      3. Limited views of pancreas, liver, spleen were all normal. Impression:               - The subepithelial gastric lesion noted 2 years                            ago has not grown and is actually measuring smaller                            on this exam. I think it is highly unlikely to have                            any current or future clinical relevance and                            recommend no further surveillance testing. Moderate Sedation:      N/A-  Per Anesthesia Care Recommendation:           - Discharge patient to home (ambulatory). Procedure Code(s):        --- Professional ---                           [phone], 25, Esophagogastroduodenoscopy, flexible,                            transoral; with endoscopic ultrasound examination                            limited to the esophagus, stomach or duodenum, and                            adjacent structures Diagnosis Code(s):        --- Professional ---                           K31.89, Other diseases of stomach and duodenum CPT copyright 2016 American Medical Association. All rights reserved. The codes documented in this report are preliminary and upon coder review may  be revised to meet current compliance requirements. Milus Banister, MD 09/02/2016 8:18:06 AM This report has been signed electronically. Number of Addenda: 0

## 2016-09-02 NOTE — Interval H&P Note (Signed)
History and Physical Interval Note:  09/02/2016 7:11 AM  Jessica Good  has presented today for surgery, with the diagnosis of rectal bleeding, gastric lesion  The various methods of treatment have been discussed with the patient and family. After consideration of risks, benefits and other options for treatment, the patient has consented to  Procedure(s): COLONOSCOPY WITH PROPOFOL (N/A) UPPER ENDOSCOPIC ULTRASOUND (EUS) LINEAR (N/A) as a surgical intervention .  The patient's history has been reviewed, patient examined, no change in status, stable for surgery.  I have reviewed the patient's chart and labs.  Questions were answered to the patient's satisfaction.     Milus Banister

## 2016-09-06 ENCOUNTER — Encounter (HOSPITAL_COMMUNITY): Payer: Self-pay | Admitting: Gastroenterology

## 2016-09-07 ENCOUNTER — Ambulatory Visit
Admission: RE | Admit: 2016-09-07 | Discharge: 2016-09-07 | Disposition: A | Discharge status: Home / Self Care | Payer: Medicare Other | Source: Ambulatory Visit | Attending: *Deleted | Admitting: *Deleted

## 2016-09-07 DIAGNOSIS — M810 Age-related osteoporosis without current pathological fracture: Secondary | ICD-10-CM | POA: Diagnosis not present

## 2016-09-07 DIAGNOSIS — M8580 Other specified disorders of bone density and structure, unspecified site: Secondary | ICD-10-CM

## 2016-09-07 DIAGNOSIS — Z780 Asymptomatic menopausal state: Secondary | ICD-10-CM | POA: Diagnosis not present

## 2016-09-14 DIAGNOSIS — Z713 Dietary counseling and surveillance: Secondary | ICD-10-CM | POA: Diagnosis not present

## 2016-09-14 DIAGNOSIS — Z6839 Body mass index (BMI) 39.0-39.9, adult: Secondary | ICD-10-CM | POA: Diagnosis not present

## 2016-09-14 DIAGNOSIS — S0096XA Insect bite (nonvenomous) of unspecified part of head, initial encounter: Secondary | ICD-10-CM | POA: Diagnosis not present

## 2016-09-15 ENCOUNTER — Encounter: Payer: Medicare Other | Admitting: Sports Medicine

## 2016-09-16 ENCOUNTER — Encounter: Payer: Medicare Other | Admitting: Sports Medicine

## 2016-10-11 NOTE — Addendum Note (Signed)
Addendum  created 10/11/16 1351 by Myrtie Soman, MD   Sign clinical note

## 2016-10-15 DIAGNOSIS — M25562 Pain in left knee: Secondary | ICD-10-CM | POA: Diagnosis not present

## 2016-11-08 DIAGNOSIS — R300 Dysuria: Secondary | ICD-10-CM | POA: Diagnosis not present

## 2016-11-08 DIAGNOSIS — M25512 Pain in left shoulder: Secondary | ICD-10-CM | POA: Diagnosis not present

## 2016-11-08 DIAGNOSIS — S9032XA Contusion of left foot, initial encounter: Secondary | ICD-10-CM | POA: Diagnosis not present

## 2016-11-15 DIAGNOSIS — R0789 Other chest pain: Secondary | ICD-10-CM | POA: Diagnosis not present

## 2016-11-15 DIAGNOSIS — R0609 Other forms of dyspnea: Secondary | ICD-10-CM | POA: Diagnosis not present

## 2016-11-18 DIAGNOSIS — R0600 Dyspnea, unspecified: Secondary | ICD-10-CM | POA: Diagnosis not present

## 2016-11-24 DIAGNOSIS — R7303 Prediabetes: Secondary | ICD-10-CM | POA: Diagnosis not present

## 2016-11-24 DIAGNOSIS — I10 Essential (primary) hypertension: Secondary | ICD-10-CM | POA: Diagnosis not present

## 2016-11-24 DIAGNOSIS — R0609 Other forms of dyspnea: Secondary | ICD-10-CM | POA: Diagnosis not present

## 2016-11-26 ENCOUNTER — Telehealth: Payer: Self-pay | Admitting: Endocrinology

## 2016-11-26 NOTE — Telephone Encounter (Signed)
Patient called in reference to making new patient appointment. Please call and advise

## 2017-01-07 ENCOUNTER — Encounter: Payer: Self-pay | Admitting: Endocrinology

## 2017-01-07 ENCOUNTER — Ambulatory Visit (INDEPENDENT_AMBULATORY_CARE_PROVIDER_SITE_OTHER): Payer: Medicare Other | Admitting: Endocrinology

## 2017-01-07 DIAGNOSIS — E049 Nontoxic goiter, unspecified: Secondary | ICD-10-CM | POA: Diagnosis not present

## 2017-01-07 LAB — T4, FREE: FREE T4: 1.07 ng/dL (ref 0.60–1.60)

## 2017-01-07 LAB — TSH: TSH: 0.6 u[IU]/mL (ref 0.35–4.50)

## 2017-01-07 NOTE — Progress Notes (Signed)
Subjective:    Patient ID: Jessica Good, female    DOB: [birth year], 66 y.o.   MRN: [id number]  HPI In mid-2018, pt was incidentally noted on CT to have a goiter, extending to the chest.  she is unaware of ever having had thyroid problems in the past.  she has no h/o XRT or surgery to the neck.  She has moderate weight gain, and assoc fatigue.   Past Medical History:  Diagnosis Date  . Adenomatous colon polyp   . Anemia yrs ago   PMH  . Anxiety   . Cataract   . Constipation due to pain medication   . Edema of lower extremity    Takes lasix PRN  . Gallstones   . Gastritis   . GERD (gastroesophageal reflux disease)    with meds  . Hiatal hernia   . History of recurrent UTIs   . Hyperlipidemia   . Hypertension   . IBS (irritable bowel syndrome)   . Interstitial cystitis   . Loose, teeth    in the front of mouth  . Osteoarthritis   . Osteoporosis    osteoarthritis  . Pre-diabetes   . Pseudogout   . Urgency of urination     Past Surgical History:  Procedure Laterality Date  . ANKLE SURGERY Right   . BREAST LUMPECTOMY Left 1996   fibroid  . COLONOSCOPY W/ POLYPECTOMY    . COLONOSCOPY WITH PROPOFOL N/A 09/02/2016   Procedure: COLONOSCOPY WITH PROPOFOL;  Surgeon: Milus Banister, MD;  Location: [facility];  Service: Endoscopy;  Laterality: N/A;  . EUS N/A 02/06/2015   Procedure: UPPER ENDOSCOPIC ULTRASOUND (EUS) LINEAR;  Surgeon: Milus Banister, MD;  Location: [facility];  Service: Endoscopy;  Laterality: N/A;  . EUS N/A 09/02/2016   Procedure: UPPER ENDOSCOPIC ULTRASOUND (EUS) LINEAR;  Surgeon: Milus Banister, MD;  Location: [facility];  Service: Endoscopy;  Laterality: N/A;  . EYE SURGERY Bilateral    lasix 14 yrs ago  . JOINT REPLACEMENT    . PARTIAL KNEE ARTHROPLASTY Left 11/26/2014   Procedure: LEFT UNICOMPARTMENTAL KNEE;  Surgeon: Renette Butters, MD;  Location: [place];  Service: Orthopedics;  Laterality: Left;  . TOTAL KNEE ARTHROPLASTY Right 04/09/2014   DR MURPHY  . TOTAL KNEE ARTHROPLASTY Right 04/09/2014   Procedure: TOTAL KNEE ARTHROPLASTY;  Surgeon: Renette Butters, MD;  Location: [place];  Service: Orthopedics;  Laterality: Right;  . TOTAL KNEE REVISION Left 11/03/2015   Procedure: TOTAL KNEE REVISION;  Surgeon: Renette Butters, MD;  Location: [place];  Service: Orthopedics;  Laterality: Left;    Social History   Social History  . Marital status: Married    Spouse name: N/A  . Number of children: N/A  . Years of education: N/A   Occupational History  . Not on file.   Social History Main Topics  . Smoking status: Never Smoker  . Smokeless tobacco: Never Used  . Alcohol use No  . Drug use: No  . Sexual activity: Not on file   Other Topics Concern  . Not on file   Social History Narrative  . No narrative on file    Current Outpatient Prescriptions on File Prior to Visit  Medication Sig Dispense Refill  . aspirin 81 MG tablet Take 81 mg by mouth daily.    . cholecalciferol (VITAMIN D) 1000 UNITS tablet Take 1,000 Units by mouth daily.    . furosemide (LASIX) 40 MG tablet Take 40 mg by  mouth daily as needed for fluid.     Marland Kitchen lisinopril (PRINIVIL,ZESTRIL) 20 MG tablet Take 20 mg by mouth daily.     . methocarbamol (ROBAXIN) 500 MG tablet Take 1 tablet (500 mg total) by mouth every 6 (six) hours as needed for muscle spasms. 40 tablet 0  . Multiple Vitamin (MULTIVITAMIN WITH MINERALS) TABS Take 1 tablet by mouth daily.    . sucralfate (CARAFATE) 1 g tablet Take 1 g by mouth 4 (four) times daily -  with meals and at bedtime.    . thiamine (VITAMIN B-1) 100 MG tablet Take 100 mg by mouth daily.    . vitamin B-12 (CYANOCOBALAMIN) 500 MCG tablet Take 500 mcg by mouth daily.    . vitamin C (ASCORBIC ACID) 500 MG tablet Take 500 mg by mouth daily.    Marland Kitchen acetaminophen (TYLENOL) 500 MG tablet Take 500 mg by mouth every 6 (six) hours as needed for moderate pain.    . nabumetone (RELAFEN) 750 MG tablet Take 750 mg by mouth 2 (two) times  daily with a meal.  5   No current facility-administered medications on file prior to visit.     Allergies  Allergen Reactions  . Demerol [Meperidine] Other (See Comments)    caused excessive sleepiness  . Latex Rash    Pt. Stated she has a " latex allergy" and it "gives her a rash".  . Sulfa Antibiotics Other (See Comments)    : Flu-like symptoms  . Adhesive [Tape] Rash    Reaction to bandaids    Family History  Problem Relation Age of Onset  . Pancreatic cancer Mother        head of pancreas removed   . Diabetes Father   . Heart disease Father   . Diabetes Maternal Grandmother   . Breast cancer Neg Hx   . Celiac disease Neg Hx   . Cirrhosis Neg Hx   . Clotting disorder Neg Hx   . Colitis Neg Hx   . Colon cancer Neg Hx   . Colon polyps Neg Hx   . Crohn's disease Neg Hx   . Cystic fibrosis Neg Hx   . Esophageal cancer Neg Hx   . Hemochromatosis Neg Hx   . Inflammatory bowel disease Neg Hx   . Irritable bowel syndrome Neg Hx   . Kidney disease Neg Hx   . Liver cancer Neg Hx   . Liver disease Neg Hx   . Ovarian cancer Neg Hx   . Prostate cancer Neg Hx   . Rectal cancer Neg Hx   . Stomach cancer Neg Hx   . Ulcerative colitis Neg Hx   . Uterine cancer Neg Hx   . Wilson's disease Neg Hx   . Hypertension Neg Hx   . Goiter Neg Hx     BP 132/84   Pulse 76   Wt 263 lb 6.4 oz (119.5 kg)   SpO2 97%   BMI 48.96 kg/m    Review of Systems Denies hoarseness, neck pain, diplopia, chest pain, sob, dysphagia, diarrhea, itching, depression, cold intolerance, headache, numbness, and rhinorrhea.  She has excessive diaphoresis.  She has a slight cough.  She has flushing and easy bruising.      Objective:   Physical Exam VS: see vs page GEN: no distress HEAD: head: no deformity eyes: no periorbital swelling, no proptosis external nose and ears are normal mouth: no lesion seen NECK: possible palpable LLP thyroid nodule.  CHEST WALL: no deformity LUNGS: clear  to  auscultation CV: reg rate and rhythm, no murmur ABD: abdomen is soft, nontender.  no hepatosplenomegaly.  not distended.  no hernia MUSCULOSKELETAL: muscle bulk and strength are grossly normal.  no obvious joint swelling.  gait is normal and steady EXTEMITIES: no deformity.  no ulcer on the feet.  feet are of normal color and temp.  no edema PULSES: dorsalis pedis intact bilat.  no carotid bruit NEURO:  cn 2-12 grossly intact.   readily moves all 4's.  sensation is intact to touch on the feet SKIN:  Normal texture and temperature.  No rash or suspicious lesion is visible.  Old healed surgical scar at the right medial malleolus.  NODES:  None palpable at the neck.  PSYCH: alert, well-oriented.  Does not appear anxious nor depressed.     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  Lab Results  Component Value Date   TSH 0.60 01/07/2017   I have reviewed outside records, and summarized: Pt was noted to have goiter, and referred here.  She was eval for dyspnea.  CT done to exclude PE, and incidentally noted a goiter     Assessment & Plan:  Goiter: new to me.  Usually hereditary.  Low-normal TSH suggests benign etiol, but she'll prob need US-guided bx   Patient Instructions  blood tests are requested for you today.  We'll let you know about the results. Let's check the ultrasound.  you will receive a phone call, about a day and time for an appointment. If the ultrasound says you need a biopsy, don't worry.  It is easy, and is very unlikely to be anything bad.  Please return in 1 year.

## 2017-01-07 NOTE — Patient Instructions (Signed)
blood tests are requested for you today.  We'll let you know about the results. Let's check the ultrasound.  you will receive a phone call, about a day and time for an appointment. If the ultrasound says you need a biopsy, don't worry.  It is easy, and is very unlikely to be anything bad.  Please return in 1 year.

## 2017-01-10 LAB — CALCITONIN: Calcitonin: <2 pg/mL (ref <=5)

## 2017-01-19 ENCOUNTER — Ambulatory Visit
Admission: RE | Admit: 2017-01-19 | Discharge: 2017-01-19 | Disposition: A | Discharge status: Home / Self Care | Payer: Medicare Other | Source: Ambulatory Visit | Attending: Endocrinology | Admitting: Endocrinology

## 2017-01-19 ENCOUNTER — Other Ambulatory Visit: Payer: Self-pay | Admitting: Endocrinology

## 2017-01-19 DIAGNOSIS — E049 Nontoxic goiter, unspecified: Secondary | ICD-10-CM

## 2017-01-19 DIAGNOSIS — E042 Nontoxic multinodular goiter: Secondary | ICD-10-CM | POA: Diagnosis not present

## 2017-01-20 ENCOUNTER — Other Ambulatory Visit: Payer: Self-pay | Admitting: Endocrinology

## 2017-01-20 DIAGNOSIS — E049 Nontoxic goiter, unspecified: Secondary | ICD-10-CM

## 2017-01-21 ENCOUNTER — Other Ambulatory Visit: Payer: Self-pay | Admitting: Endocrinology

## 2017-01-21 ENCOUNTER — Telehealth: Payer: Self-pay | Admitting: Endocrinology

## 2017-01-21 DIAGNOSIS — E049 Nontoxic goiter, unspecified: Secondary | ICD-10-CM

## 2017-01-21 NOTE — Telephone Encounter (Signed)
[facility] calling in reference to needing additional orders for thyroid biopsy to be placed in epic for patient.  [id number]. Please call [facility] and advise.

## 2017-01-21 NOTE — Telephone Encounter (Signed)
done

## 2017-01-31 ENCOUNTER — Telehealth: Payer: Self-pay | Admitting: Endocrinology

## 2017-01-31 NOTE — Telephone Encounter (Signed)
Patient called in reference to having biopsy on thyroid on 02/09/17. Patient would like to know if she needs to stop taking aspirin prior to this and also would like to know if she could get something prescribed to calm her nerves for the biopsy. Please call patient and advise. OK to leave message.

## 2017-01-31 NOTE — Telephone Encounter (Signed)
routing to you °

## 2017-01-31 NOTE — Telephone Encounter (Signed)
Yes, please stop taking 3 days prior.  You can resume right after the bx

## 2017-01-31 NOTE — Telephone Encounter (Signed)
Notified patient to stop taking 3 day prior. She was still very nervous about biopsy & was hoping to be prescribed something for her nerves?

## 2017-02-01 MED ORDER — TRIAZOLAM 0.125 MG PO TABS: 0.1250 mg | ORAL_TABLET | Freq: Once | ORAL | 0 refills | Status: DC

## 2017-02-01 NOTE — Telephone Encounter (Signed)
I printed You need to have a driver to and from the procedure in order to take this.

## 2017-02-01 NOTE — Addendum Note (Signed)
Addended by: Renato Shin on: 02/01/2017 10:47 AM   Modules accepted: Orders

## 2017-02-07 ENCOUNTER — Telehealth: Payer: Self-pay | Admitting: Endocrinology

## 2017-02-07 ENCOUNTER — Other Ambulatory Visit: Payer: Self-pay

## 2017-02-07 MED ORDER — TRIAZOLAM 0.125 MG PO TABS: 0.1250 mg | ORAL_TABLET | Freq: Once | ORAL | 0 refills | Status: DC

## 2017-02-07 NOTE — Telephone Encounter (Signed)
I did this rx last week

## 2017-02-07 NOTE — Telephone Encounter (Signed)
[facility] called in reference to patient requesting something to "calm her nerves" for biopsy on 02/09/17 at 3pm. Please call patient and advise.

## 2017-02-08 ENCOUNTER — Telehealth: Payer: Self-pay | Admitting: Endocrinology

## 2017-02-08 NOTE — Telephone Encounter (Signed)
Orders are already in, x 2

## 2017-02-08 NOTE — Telephone Encounter (Signed)
Called patient & orders are in.

## 2017-02-08 NOTE — Telephone Encounter (Signed)
[facility] called to state they needed an order put in for patients biopsy tomorrow. Please advise.

## 2017-02-08 NOTE — Telephone Encounter (Signed)
Called patient to let her know prescription was faxed to pharmacy but I had to leave VM.

## 2017-02-09 ENCOUNTER — Ambulatory Visit
Admission: RE | Admit: 2017-02-09 | Discharge: 2017-02-09 | Disposition: A | Discharge status: Home / Self Care | Payer: Medicare Other | Source: Ambulatory Visit | Attending: Endocrinology | Admitting: Endocrinology

## 2017-02-09 ENCOUNTER — Other Ambulatory Visit (HOSPITAL_COMMUNITY)
Admission: RE | Admit: 2017-02-09 | Discharge: 2017-02-09 | Disposition: A | Discharge status: Home / Self Care | Payer: Medicare Other | Source: Ambulatory Visit | Attending: Radiology | Admitting: Radiology

## 2017-02-09 DIAGNOSIS — E049 Nontoxic goiter, unspecified: Secondary | ICD-10-CM

## 2017-02-09 DIAGNOSIS — E041 Nontoxic single thyroid nodule: Secondary | ICD-10-CM | POA: Diagnosis not present

## 2017-02-09 DIAGNOSIS — E040 Nontoxic diffuse goiter: Secondary | ICD-10-CM | POA: Diagnosis not present

## 2017-02-09 DIAGNOSIS — E042 Nontoxic multinodular goiter: Secondary | ICD-10-CM | POA: Diagnosis not present

## 2017-02-14 ENCOUNTER — Encounter: Payer: Self-pay | Admitting: Endocrinology

## 2017-03-11 DIAGNOSIS — R05 Cough: Secondary | ICD-10-CM | POA: Diagnosis not present

## 2017-03-11 DIAGNOSIS — R638 Other symptoms and signs concerning food and fluid intake: Secondary | ICD-10-CM | POA: Diagnosis not present

## 2017-03-11 DIAGNOSIS — I10 Essential (primary) hypertension: Secondary | ICD-10-CM | POA: Diagnosis not present

## 2017-03-11 DIAGNOSIS — K029 Dental caries, unspecified: Secondary | ICD-10-CM | POA: Diagnosis not present

## 2017-03-11 DIAGNOSIS — E119 Type 2 diabetes mellitus without complications: Secondary | ICD-10-CM | POA: Diagnosis not present

## 2017-03-11 DIAGNOSIS — M25562 Pain in left knee: Secondary | ICD-10-CM | POA: Diagnosis not present

## 2017-05-04 ENCOUNTER — Emergency Department (HOSPITAL_COMMUNITY)
Admission: EM | Admit: 2017-05-04 | Discharge: 2017-05-04 | Disposition: A | Discharge status: Home / Self Care | Payer: Medicare Other | Attending: Emergency Medicine | Admitting: Emergency Medicine

## 2017-05-04 ENCOUNTER — Ambulatory Visit (HOSPITAL_BASED_OUTPATIENT_CLINIC_OR_DEPARTMENT_OTHER)
Admission: RE | Admit: 2017-05-04 | Discharge: 2017-05-04 | Disposition: A | Discharge status: Home / Self Care | Payer: Medicare Other | Source: Ambulatory Visit | Attending: Physician Assistant | Admitting: Physician Assistant

## 2017-05-04 ENCOUNTER — Other Ambulatory Visit: Payer: Self-pay

## 2017-05-04 ENCOUNTER — Encounter (HOSPITAL_COMMUNITY): Payer: Self-pay | Admitting: Emergency Medicine

## 2017-05-04 DIAGNOSIS — M79662 Pain in left lower leg: Secondary | ICD-10-CM

## 2017-05-04 DIAGNOSIS — M7989 Other specified soft tissue disorders: Secondary | ICD-10-CM

## 2017-05-04 DIAGNOSIS — I10 Essential (primary) hypertension: Secondary | ICD-10-CM | POA: Diagnosis not present

## 2017-05-04 DIAGNOSIS — M79609 Pain in unspecified limb: Secondary | ICD-10-CM

## 2017-05-04 DIAGNOSIS — Z7982 Long term (current) use of aspirin: Secondary | ICD-10-CM | POA: Diagnosis not present

## 2017-05-04 DIAGNOSIS — Z96652 Presence of left artificial knee joint: Secondary | ICD-10-CM | POA: Insufficient documentation

## 2017-05-04 DIAGNOSIS — Z79899 Other long term (current) drug therapy: Secondary | ICD-10-CM | POA: Diagnosis not present

## 2017-05-04 DIAGNOSIS — M79605 Pain in left leg: Secondary | ICD-10-CM | POA: Diagnosis not present

## 2017-05-04 DIAGNOSIS — Z96651 Presence of right artificial knee joint: Secondary | ICD-10-CM | POA: Diagnosis not present

## 2017-05-04 DIAGNOSIS — R2242 Localized swelling, mass and lump, left lower limb: Secondary | ICD-10-CM | POA: Insufficient documentation

## 2017-05-04 DIAGNOSIS — Z91040 Latex allergy status: Secondary | ICD-10-CM | POA: Insufficient documentation

## 2017-05-04 LAB — BASIC METABOLIC PANEL
Anion gap: 8 (ref 5–15)
BUN: 12 mg/dL (ref 6–20)
CO2: 27 mmol/L (ref 22–32)
CREATININE: 0.75 mg/dL (ref 0.44–1.00)
Calcium: 9.4 mg/dL (ref 8.9–10.3)
Chloride: 102 mmol/L (ref 101–111)
GFR calc Af Amer: >60 mL/min (ref >60)
Glucose, Bld: 156 mg/dL — ABNORMAL HIGH (ref 65–99)
POTASSIUM: 3.9 mmol/L (ref 3.5–5.1)
SODIUM: 137 mmol/L (ref 135–145)

## 2017-05-04 LAB — D-DIMER, QUANTITATIVE: D-Dimer, Quant: 1.15 ug/mL-FEU — ABNORMAL HIGH (ref 0.00–0.50)

## 2017-05-04 LAB — CBC
HCT: 40.8 % (ref 36.0–46.0)
Hemoglobin: 13.4 g/dL (ref 12.0–15.0)
MCH: 30 pg (ref 26.0–34.0)
MCHC: 32.8 g/dL (ref 30.0–36.0)
MCV: 91.5 fL (ref 78.0–100.0)
PLATELETS: 210 10*3/uL (ref 150–400)
RBC: 4.46 MIL/uL (ref 3.87–5.11)
RDW: 12.9 % (ref 11.5–15.5)
WBC: 6 10*3/uL (ref 4.0–10.5)

## 2017-05-04 MED ORDER — ENOXAPARIN SODIUM 120 MG/0.8ML ~~LOC~~ SOLN
1.0000 mg/kg | Freq: Once | SUBCUTANEOUS | Status: AC
  Administered 2017-05-04: 120 mg via SUBCUTANEOUS
  Filled 2017-05-04: qty 0.8

## 2017-05-04 NOTE — ED Triage Notes (Signed)
Reports driving to [place] on Wednesday and drove back on Friday.  Had severe cramping in the middle of the night early Saturday morning.  Continued pain since.  Positive homans sign noted.  Some swelling noted to calf.

## 2017-05-04 NOTE — Discharge Instructions (Signed)
1. Medications: usual home medications 2. Treatment: rest, drink plenty of fluids, elevate legs 3. Follow Up: You have been scheduled for an Outpatient Vascular Study at [facility].  Please go to [facility] Admitting Department at 8 am and tell them you are there for a vascular study.

## 2017-05-04 NOTE — Progress Notes (Signed)
Left lower extremity venous duplex cpmpleted. No evidence of DVT, superficial thrombosis, or Baker's cyst. Toma Copier, RVS  05/04/2017 10:00 am

## 2017-05-04 NOTE — ED Provider Notes (Signed)
[facility] EMERGENCY DEPARTMENT Provider Note   CSN: [id number] Arrival date & time: 05/04/17  0021     History   Chief Complaint Chief Complaint  Patient presents with  . Leg Pain    HPI Jessica Good is a 66 y.o. female with a hx of anemia, anxiety gallstones, GERD, recurrent UTIs, hypertension, interstitial cystitis presents to the Emergency Department complaining of gradual, persistent, progressively worsening left leg pain onset 4 days ago.  Patient reports that last week she drove 12 hours each way to [place] in a 3-day time span.  She reports wearing compression stockings for 1 way.  She denies history of cancer, lupus, smoking, DVT, estrogen usage.  She reports that after returning from [place] she had left leg swelling and calf pain.  She denies chest pain, shortness of breath, dyspnea on exertion.  She reports swelling of the left leg does seem to improve with elevation.  Walking makes the pain worse.  Patient denies open wounds or signs of infection.  No fevers or chills, nausea or vomiting, weakness, dizziness, syncope.  Patient does not currently take a blood thinner.  The history is provided by the patient and medical records. No language interpreter was used.    Past Medical History:  Diagnosis Date  . Adenomatous colon polyp   . Anemia yrs ago   PMH  . Anxiety   . Cataract   . Constipation due to pain medication   . Edema of lower extremity    Takes lasix PRN  . Gallstones   . Gastritis   . GERD (gastroesophageal reflux disease)    with meds  . Hiatal hernia   . History of recurrent UTIs   . Hyperlipidemia   . Hypertension   . IBS (irritable bowel syndrome)   . Interstitial cystitis   . Loose, teeth    in the front of mouth  . Osteoarthritis   . Osteoporosis    osteoarthritis  . Pre-diabetes   . Pseudogout   . Urgency of urination     Patient Active Problem List   Diagnosis Date Noted  . Goiter 01/07/2017  . Gastric lesion    . Rectal bleeding   . Right ankle pain 12/23/2015  . H/O arthroplasty   . Prosthetic joint infection ([place])   . Left knee pain   . Postoperative anemia due to acute blood loss 11/04/2015  . Pseudogout of knee 11/03/2015  . Abnormality of gait 07/10/2015  . Morbid obesity ([place]) 07/10/2015  . Primary localized osteoarthritis of left knee 11/27/2014  . Arthritis of knee 04/09/2014  . Constipation due to opioid therapy 02/13/2014  . Gastroesophageal reflux disease without esophagitis 02/13/2014    Past Surgical History:  Procedure Laterality Date  . ANKLE SURGERY Right   . BREAST LUMPECTOMY Left 1996   fibroid  . COLONOSCOPY W/ POLYPECTOMY    . COLONOSCOPY WITH PROPOFOL N/A 09/02/2016   Procedure: COLONOSCOPY WITH PROPOFOL;  Surgeon: Milus Banister, MD;  Location: [facility];  Service: Endoscopy;  Laterality: N/A;  . EUS N/A 02/06/2015   Procedure: UPPER ENDOSCOPIC ULTRASOUND (EUS) LINEAR;  Surgeon: Milus Banister, MD;  Location: [facility];  Service: Endoscopy;  Laterality: N/A;  . EUS N/A 09/02/2016   Procedure: UPPER ENDOSCOPIC ULTRASOUND (EUS) LINEAR;  Surgeon: Milus Banister, MD;  Location: [facility];  Service: Endoscopy;  Laterality: N/A;  . EYE SURGERY Bilateral    lasix 14 yrs ago  . JOINT REPLACEMENT    .  PARTIAL KNEE ARTHROPLASTY Left 11/26/2014   Procedure: LEFT UNICOMPARTMENTAL KNEE;  Surgeon: Renette Butters, MD;  Location: [facility];  Service: Orthopedics;  Laterality: Left;  . TOTAL KNEE ARTHROPLASTY Right 04/09/2014   DR MURPHY  . TOTAL KNEE ARTHROPLASTY Right 04/09/2014   Procedure: TOTAL KNEE ARTHROPLASTY;  Surgeon: Renette Butters, MD;  Location: [facility];  Service: Orthopedics;  Laterality: Right;  . TOTAL KNEE REVISION Left 11/03/2015   Procedure: TOTAL KNEE REVISION;  Surgeon: Renette Butters, MD;  Location: [facility];  Service: Orthopedics;  Laterality: Left;    OB History    No data available       Home Medications    Prior to Admission medications     Medication Sig Start Date End Date Taking? Authorizing Provider  acetaminophen (TYLENOL) 500 MG tablet Take 500 mg by mouth every 6 (six) hours as needed for moderate pain.   Yes [provider]  alendronate (FOSAMAX) 70 MG tablet Take 70 mg by mouth once a week. Take with a full glass of water on an empty stomach.   Yes [provider]  aspirin 81 MG tablet Take 81 mg by mouth daily.   Yes [provider]  calcium-vitamin D (OSCAL WITH D) 500-200 MG-UNIT tablet Take 1 tablet by mouth daily.   Yes [provider]  cholecalciferol (VITAMIN D) 1000 UNITS tablet Take 1,000 Units by mouth daily.   Yes [provider]  furosemide (LASIX) 40 MG tablet Take 40 mg by mouth daily as needed for fluid.    Yes [provider]  lisinopril (PRINIVIL,ZESTRIL) 20 MG tablet Take 20 mg by mouth daily.    Yes [provider]  methocarbamol (ROBAXIN) 500 MG tablet Take 1 tablet (500 mg total) by mouth every 6 (six) hours as needed for muscle spasms. 11/03/15  Yes Renette Butters, MD  Multiple Vitamin (MULTIVITAMIN WITH MINERALS) TABS Take 1 tablet by mouth daily.   Yes [provider]  nabumetone (RELAFEN) 750 MG tablet Take 750 mg by mouth 2 (two) times daily as needed for mild pain.  07/13/16  Yes [provider]  sucralfate (CARAFATE) 1 g tablet Take 1 g by mouth 3 (three) times daily as needed (ulcer).    Yes [provider]  thiamine (VITAMIN B-1) 100 MG tablet Take 100 mg by mouth daily.   Yes [provider]  vitamin B-12 (CYANOCOBALAMIN) 500 MCG tablet Take 500 mcg by mouth daily.   Yes [provider]  vitamin C (ASCORBIC ACID) 500 MG tablet Take 500 mg by mouth daily.   Yes [provider]    Family History Family History  Problem Relation Age of Onset  . Pancreatic cancer Mother        head of pancreas removed   . Diabetes Father   . Heart disease Father   . Diabetes Maternal  Grandmother   . Breast cancer Neg Hx   . Celiac disease Neg Hx   . Cirrhosis Neg Hx   . Clotting disorder Neg Hx   . Colitis Neg Hx   . Colon cancer Neg Hx   . Colon polyps Neg Hx   . Crohn's disease Neg Hx   . Cystic fibrosis Neg Hx   . Esophageal cancer Neg Hx   . Hemochromatosis Neg Hx   . Inflammatory bowel disease Neg Hx   . Irritable bowel syndrome Neg Hx   . Kidney disease Neg Hx   . Liver cancer Neg  Hx   . Liver disease Neg Hx   . Ovarian cancer Neg Hx   . Prostate cancer Neg Hx   . Rectal cancer Neg Hx   . Stomach cancer Neg Hx   . Ulcerative colitis Neg Hx   . Uterine cancer Neg Hx   . Wilson's disease Neg Hx   . Hypertension Neg Hx   . Goiter Neg Hx     Social History Social History   Tobacco Use  . Smoking status: Never Smoker  . Smokeless tobacco: Never Used  Substance Use Topics  . Alcohol use: No    Alcohol/week: 0.0 oz  . Drug use: No     Allergies   Demerol [meperidine]; Latex; Sulfa antibiotics; and Adhesive [tape]   Review of Systems Review of Systems  Constitutional: Negative for appetite change, diaphoresis, fatigue, fever and unexpected weight change.  HENT: Negative for mouth sores.   Eyes: Negative for visual disturbance.  Respiratory: Negative for cough, chest tightness, shortness of breath and wheezing.   Cardiovascular: Positive for leg swelling (left). Negative for chest pain.  Gastrointestinal: Negative for abdominal pain, constipation, diarrhea, nausea and vomiting.  Endocrine: Negative for polydipsia, polyphagia and polyuria.  Genitourinary: Negative for dysuria, frequency, hematuria and urgency.  Musculoskeletal: Positive for myalgias ( left calf). Negative for back pain and neck stiffness.  Skin: Negative for rash.  Allergic/Immunologic: Negative for immunocompromised state.  Neurological: Negative for syncope, light-headedness and headaches.  Hematological: Does not bruise/bleed easily.  Psychiatric/Behavioral: Negative for  sleep disturbance. The patient is not nervous/anxious.      Physical Exam Updated Vital Signs BP (!) 166/83   Pulse 70   Temp 98.2 F (36.8 C) (Oral)   Resp 18   Ht 5\' 1"  (1.549 m)   Wt 119.3 kg (263 lb)   SpO2 97%   BMI 49.69 kg/m   Physical Exam  Constitutional: She appears well-developed and well-nourished. No distress.  Awake, alert, nontoxic appearance  HENT:  Head: Normocephalic and atraumatic.  Mouth/Throat: Oropharynx is clear and moist. No oropharyngeal exudate.  Eyes: Conjunctivae are normal. No scleral icterus.  Neck: Normal range of motion. Neck supple.  Cardiovascular: Normal rate, regular rhythm and intact distal pulses.  Pulmonary/Chest: Effort normal and breath sounds normal. No respiratory distress. She has no wheezes.  Equal chest expansion  Abdominal: Soft. Bowel sounds are normal. She exhibits no mass. There is no tenderness. There is no rebound and no guarding.  Musculoskeletal: Normal range of motion. She exhibits edema.  Mild edema of the left lower leg TTP along the left calf Capillary refill < 3 sec on the BLE  Neurological: She is alert.  Speech is clear and goal oriented Moves extremities without ataxia Sensation intact to normal touch in the BLE  Skin: Skin is warm and dry. She is not diaphoretic.  Psychiatric: She has a normal mood and affect.  Nursing note and vitals reviewed.    ED Treatments / Results  Labs (all labs ordered are listed, but only abnormal results are displayed) Labs Reviewed  D-DIMER, QUANTITATIVE (NOT AT [facility]) - Abnormal; Notable for the following components:      Result Value   D-Dimer, Quant 1.15 (*)    All other components within normal limits  BASIC METABOLIC PANEL - Abnormal; Notable for the following components:   Glucose, Bld 156 (*)    All other components within normal limits  CBC    EKG  EKG Interpretation None       Radiology  No results found.  Procedures Procedures (including critical care  time)  Medications Ordered in ED Medications  enoxaparin (LOVENOX) injection 120 mg (not administered)     Initial Impression / Assessment and Plan / ED Course  I have reviewed the triage vital signs and the nursing notes.  Pertinent labs & imaging results that were available during my care of the patient were reviewed by me and considered in my medical decision making (see chart for details).     Pt with concerns for and increased risk of DVT 2/2 recent travel.  Pt without CP, SOB or DOE.  Doubt PE.  Pt with good circulation.  Mild edema.  Unable to get Venous duplex after hours.  Will give Lovenox and schedule LE Venous duplex for the AM.  Discussed labwork and reasons to return immediately to the emergency room.  Pt and husband state understanding and are in agreement with the plan  The patient was discussed with and seen by Dr. Rex Kras who agrees with the treatment plan.    Final Clinical Impressions(s) / ED Diagnoses   Final diagnoses:  Left leg pain  Left leg swelling    ED Discharge Orders        Ordered    VAS [place] LOWER EXTREMITY VENOUS (DVT)     05/04/17 0318       Gearold Wainer, Jarrett Soho, PA-C 05/04/17 0320    Little, Wenda Overland, MD 05/04/17 [phone]

## 2017-05-05 DIAGNOSIS — M79662 Pain in left lower leg: Secondary | ICD-10-CM | POA: Diagnosis not present

## 2017-05-05 DIAGNOSIS — M25562 Pain in left knee: Secondary | ICD-10-CM | POA: Diagnosis not present

## 2017-05-16 DIAGNOSIS — M545 Low back pain: Secondary | ICD-10-CM | POA: Diagnosis not present

## 2017-05-16 DIAGNOSIS — M79641 Pain in right hand: Secondary | ICD-10-CM | POA: Diagnosis not present

## 2017-05-16 DIAGNOSIS — M25562 Pain in left knee: Secondary | ICD-10-CM | POA: Diagnosis not present

## 2017-05-16 DIAGNOSIS — M25551 Pain in right hip: Secondary | ICD-10-CM | POA: Diagnosis not present

## 2017-05-16 DIAGNOSIS — M2550 Pain in unspecified joint: Secondary | ICD-10-CM | POA: Diagnosis not present

## 2017-05-21 DIAGNOSIS — M25562 Pain in left knee: Secondary | ICD-10-CM | POA: Diagnosis not present

## 2017-05-25 ENCOUNTER — Other Ambulatory Visit (HOSPITAL_COMMUNITY): Payer: Self-pay | Admitting: Orthopedic Surgery

## 2017-05-25 DIAGNOSIS — M25562 Pain in left knee: Secondary | ICD-10-CM

## 2017-05-26 DIAGNOSIS — E559 Vitamin D deficiency, unspecified: Secondary | ICD-10-CM | POA: Diagnosis not present

## 2017-05-26 DIAGNOSIS — R5383 Other fatigue: Secondary | ICD-10-CM | POA: Diagnosis not present

## 2017-05-26 DIAGNOSIS — M65311 Trigger thumb, right thumb: Secondary | ICD-10-CM | POA: Diagnosis not present

## 2017-05-26 DIAGNOSIS — M810 Age-related osteoporosis without current pathological fracture: Secondary | ICD-10-CM | POA: Diagnosis not present

## 2017-06-02 ENCOUNTER — Encounter (HOSPITAL_COMMUNITY)
Admission: RE | Admit: 2017-06-02 | Discharge: 2017-06-02 | Disposition: A | Discharge status: Home / Self Care | Payer: Medicare Other | Source: Ambulatory Visit | Attending: Orthopedic Surgery | Admitting: Orthopedic Surgery

## 2017-06-02 DIAGNOSIS — M25562 Pain in left knee: Secondary | ICD-10-CM

## 2017-06-02 DIAGNOSIS — R937 Abnormal findings on diagnostic imaging of other parts of musculoskeletal system: Secondary | ICD-10-CM | POA: Insufficient documentation

## 2017-06-02 MED ORDER — TECHNETIUM TC 99M MEDRONATE IV KIT
25.0000 | PACK | Freq: Once | INTRAVENOUS | Status: AC | PRN
  Administered 2017-06-02: 25 via INTRAVENOUS

## 2017-06-07 DIAGNOSIS — Z09 Encounter for follow-up examination after completed treatment for conditions other than malignant neoplasm: Secondary | ICD-10-CM | POA: Diagnosis not present

## 2017-06-07 DIAGNOSIS — Z96652 Presence of left artificial knee joint: Secondary | ICD-10-CM | POA: Diagnosis not present

## 2017-07-05 DIAGNOSIS — M25562 Pain in left knee: Secondary | ICD-10-CM | POA: Diagnosis not present

## 2017-07-05 DIAGNOSIS — N390 Urinary tract infection, site not specified: Secondary | ICD-10-CM | POA: Diagnosis not present

## 2017-07-05 DIAGNOSIS — N760 Acute vaginitis: Secondary | ICD-10-CM | POA: Diagnosis not present

## 2017-07-05 DIAGNOSIS — J209 Acute bronchitis, unspecified: Secondary | ICD-10-CM | POA: Diagnosis not present

## 2017-07-20 DIAGNOSIS — M810 Age-related osteoporosis without current pathological fracture: Secondary | ICD-10-CM | POA: Diagnosis not present

## 2017-08-11 ENCOUNTER — Encounter (INDEPENDENT_AMBULATORY_CARE_PROVIDER_SITE_OTHER): Payer: Self-pay

## 2017-08-16 ENCOUNTER — Ambulatory Visit (INDEPENDENT_AMBULATORY_CARE_PROVIDER_SITE_OTHER): Payer: Self-pay | Admitting: Family Medicine

## 2017-08-25 ENCOUNTER — Encounter (INDEPENDENT_AMBULATORY_CARE_PROVIDER_SITE_OTHER): Payer: Self-pay

## 2017-09-05 ENCOUNTER — Encounter (INDEPENDENT_AMBULATORY_CARE_PROVIDER_SITE_OTHER): Payer: Self-pay | Admitting: Family Medicine

## 2017-09-05 ENCOUNTER — Ambulatory Visit (INDEPENDENT_AMBULATORY_CARE_PROVIDER_SITE_OTHER): Payer: Medicare Other | Admitting: Family Medicine

## 2017-09-05 VITALS — BP 147/83 | HR 71 | Temp 97.4°F | Ht 62.0 in | Wt 263.0 lb

## 2017-09-05 DIAGNOSIS — R5383 Other fatigue: Secondary | ICD-10-CM | POA: Diagnosis not present

## 2017-09-05 DIAGNOSIS — E559 Vitamin D deficiency, unspecified: Secondary | ICD-10-CM

## 2017-09-05 DIAGNOSIS — Z6841 Body Mass Index (BMI) 40.0 and over, adult: Secondary | ICD-10-CM | POA: Diagnosis not present

## 2017-09-05 DIAGNOSIS — R0602 Shortness of breath: Secondary | ICD-10-CM

## 2017-09-05 DIAGNOSIS — Z1331 Encounter for screening for depression: Secondary | ICD-10-CM | POA: Diagnosis not present

## 2017-09-05 DIAGNOSIS — I10 Essential (primary) hypertension: Secondary | ICD-10-CM

## 2017-09-05 DIAGNOSIS — R739 Hyperglycemia, unspecified: Secondary | ICD-10-CM | POA: Diagnosis not present

## 2017-09-05 DIAGNOSIS — Z0289 Encounter for other administrative examinations: Secondary | ICD-10-CM

## 2017-09-06 DIAGNOSIS — M25561 Pain in right knee: Secondary | ICD-10-CM | POA: Diagnosis not present

## 2017-09-06 DIAGNOSIS — M25562 Pain in left knee: Secondary | ICD-10-CM | POA: Diagnosis not present

## 2017-09-06 LAB — COMPREHENSIVE METABOLIC PANEL
ALBUMIN: 3.9 g/dL (ref 3.6–4.8)
ALT: 6 IU/L (ref 0–32)
AST: 13 IU/L (ref 0–40)
Albumin/Globulin Ratio: 1.7 (ref 1.2–2.2)
Alkaline Phosphatase: 76 IU/L (ref 39–117)
BUN / CREAT RATIO: 14 (ref 12–28)
BUN: 9 mg/dL (ref 8–27)
Bilirubin Total: 0.5 mg/dL (ref 0.0–1.2)
CALCIUM: 8.8 mg/dL (ref 8.7–10.3)
CO2: 24 mmol/L (ref 20–29)
CREATININE: 0.64 mg/dL (ref 0.57–1.00)
Chloride: 101 mmol/L (ref 96–106)
GFR, EST AFRICAN AMERICAN: 107 mL/min/{1.73_m2} (ref >59)
GFR, EST NON AFRICAN AMERICAN: 93 mL/min/{1.73_m2} (ref >59)
GLOBULIN, TOTAL: 2.3 g/dL (ref 1.5–4.5)
Glucose: 139 mg/dL — ABNORMAL HIGH (ref 65–99)
POTASSIUM: 4.1 mmol/L (ref 3.5–5.2)
SODIUM: 138 mmol/L (ref 134–144)
Total Protein: 6.2 g/dL (ref 6.0–8.5)

## 2017-09-06 LAB — CBC WITH DIFFERENTIAL
Basophils Absolute: 0 10*3/uL (ref 0.0–0.2)
Basos: 0 %
EOS (ABSOLUTE): 0.1 10*3/uL (ref 0.0–0.4)
EOS: 2 %
HEMATOCRIT: 41.8 % (ref 34.0–46.6)
HEMOGLOBIN: 13.7 g/dL (ref 11.1–15.9)
IMMATURE GRANULOCYTES: 0 %
Immature Grans (Abs): 0 10*3/uL (ref 0.0–0.1)
LYMPHS ABS: 1.4 10*3/uL (ref 0.7–3.1)
Lymphs: 36 %
MCH: 30.5 pg (ref 26.6–33.0)
MCHC: 32.8 g/dL (ref 31.5–35.7)
MCV: 93 fL (ref 79–97)
MONOCYTES: 6 %
MONOS ABS: 0.2 10*3/uL (ref 0.1–0.9)
Neutrophils Absolute: 2.2 10*3/uL (ref 1.4–7.0)
Neutrophils: 56 %
RBC: 4.49 x10E6/uL (ref 3.77–5.28)
RDW: 13.2 % (ref 12.3–15.4)
WBC: 4 10*3/uL (ref 3.4–10.8)

## 2017-09-06 LAB — T3: T3, Total: 130 ng/dL (ref 71–180)

## 2017-09-06 LAB — LIPID PANEL WITH LDL/HDL RATIO
Cholesterol, Total: 151 mg/dL (ref 100–199)
HDL: 44 mg/dL (ref >39)
LDL CALC: 86 mg/dL (ref 0–99)
LDL/HDL RATIO: 2 ratio (ref 0.0–3.2)
TRIGLYCERIDES: 103 mg/dL (ref 0–149)
VLDL CHOLESTEROL CAL: 21 mg/dL (ref 5–40)

## 2017-09-06 LAB — TSH: TSH: 0.343 u[IU]/mL — AB (ref 0.450–4.500)

## 2017-09-06 LAB — HEMOGLOBIN A1C
Est. average glucose Bld gHb Est-mCnc: 157 mg/dL
Hgb A1c MFr Bld: 7.1 % — ABNORMAL HIGH (ref 4.8–5.6)

## 2017-09-06 LAB — T4, FREE: Free T4: 1.33 ng/dL (ref 0.82–1.77)

## 2017-09-06 LAB — INSULIN, RANDOM: INSULIN: 10.7 u[IU]/mL (ref 2.6–24.9)

## 2017-09-06 LAB — VITAMIN D 25 HYDROXY (VIT D DEFICIENCY, FRACTURES): VIT D 25 HYDROXY: 55.1 ng/mL (ref 30.0–100.0)

## 2017-09-07 DIAGNOSIS — M545 Low back pain: Secondary | ICD-10-CM | POA: Diagnosis not present

## 2017-09-07 DIAGNOSIS — M810 Age-related osteoporosis without current pathological fracture: Secondary | ICD-10-CM | POA: Diagnosis not present

## 2017-09-09 ENCOUNTER — Encounter (INDEPENDENT_AMBULATORY_CARE_PROVIDER_SITE_OTHER): Payer: Self-pay | Admitting: Family Medicine

## 2017-09-09 DIAGNOSIS — J209 Acute bronchitis, unspecified: Secondary | ICD-10-CM | POA: Diagnosis not present

## 2017-09-12 NOTE — Progress Notes (Signed)
Office: [phone]  /  Fax: [phone]   Dear Everardo Beals, NP,   Thank you for referring Jessica Good to our clinic. The following note includes my evaluation and treatment recommendations.  HPI:   Chief Complaint: OBESITY    Jessica Good has been referred by Everardo Beals, NP for consultation regarding her obesity and obesity related comorbidities.    Jessica Good (MR# [id number]) is a 67 y.o. female who presents on 09/05/2017 for obesity evaluation and treatment. Current BMI is Body mass index is 48.1 kg/m.Marland Kitchen Jessica Good has been struggling with her weight for many years and has been unsuccessful in either losing weight, maintaining weight loss, or reaching her healthy weight goal.     Jessica Good needs knee replacement possibly.     Jessica Good attended our information session and states she is currently in the action stage of change and ready to dedicate time achieving and maintaining a healthier weight. Jessica Good is interested in becoming our Jessica Good and working on intensive lifestyle modifications including (but not limited to) diet, exercise and weight loss.    Jessica Good states her family eats meals together she thinks her family will eat healthier with  her her desired weight loss is 113 lbs she has been heavy most of  her life she started gaining weight when she was 17 her heaviest weight ever was 276 lbs she has significant food cravings issues  she snacks frequently in the evenings she wakes up frquently in the middle of the night to eat she is frequently drinking liquids with calories she frequently makes poor food choices she frequently eats larger portions than normal  she struggles with emotional eating    Fatigue Jessica Good feels her energy is lower than it should be. This has worsened with weight gain and has not worsened recently. Sheryle admits to daytime somnolence and  admits to waking up still tired. Jessica Good is at risk for obstructive sleep apnea. Jessica Good has a  history of symptoms of daytime fatigue. Jessica Good generally gets 5 hours of sleep per night, and states they generally have nighttime awakenings. Snoring is present. Apneic episodes are not present. Epworth Sleepiness Score is 21.  Jessica Good notes increasing shortness of breath with exercising and seems to be worsening over time with weight gain. She notes getting out of breath sooner with activity than she used to. This has not gotten worse recently. Jessica Good denies orthopnea.  Hypertension Jessica Good is a 67 y.o. female with hypertension. Marletta's blood pressure is slightly elevated today. She denies chest pain. She is working weight loss to help control her blood pressure with the goal of decreasing her risk of heart attack and stroke. Ileana's blood pressure is not currently controlled.  Vitamin D Deficiency Jessica Good has a diagnosis of vitamin D deficiency. She is on Vit D supplementation and denies nausea, vomiting or muscle weakness.  Hyperglycemia Jessica Good has a history of some elevated blood glucose readings without a diagnosis of diabetes. Last fasting glucose level of 156 from December 2018. She denies polyphagia.  Depression Screen Jessica Good's Food and Mood (modified PHQ-9) score was  Depression screen PHQ 2/9 09/05/2017  Decreased Interest 3  Down, Depressed, Hopeless 3  PHQ - 2 Score 6  Altered sleeping 2  Tired, decreased energy 3  Change in appetite 2  Feeling bad or failure about yourself  3  Trouble concentrating 1  Moving slowly or fidgety/restless 3  Suicidal thoughts 0  PHQ-9 Score 20    ALLERGIES: Allergies  Allergen Reactions  . Demerol [Meperidine] Other (See Comments)    caused excessive sleepiness  . Latex Rash    Pt. Stated she has a " latex allergy" and it "gives her a rash".  . Sulfa Antibiotics Other (See Comments)    : Flu-like symptoms  . Adhesive [Tape] Rash    Reaction to bandaids    MEDICATIONS: Current Outpatient Medications  on File Prior to Visit  Medication Sig Dispense Refill  . acetaminophen (TYLENOL) 500 MG tablet Take 500 mg by mouth every 6 (six) hours as needed for moderate pain.    Marland Kitchen alendronate (FOSAMAX) 70 MG tablet Take 70 mg by mouth once a week. Take with a full glass of water on an empty stomach.    Marland Kitchen aspirin 81 MG tablet Take 81 mg by mouth daily.    . calcium-vitamin D (OSCAL WITH D) 500-200 MG-UNIT tablet Take 1 tablet by mouth daily.    . cholecalciferol (VITAMIN D) 1000 UNITS tablet Take 1,000 Units by mouth daily.    . furosemide (LASIX) 40 MG tablet Take 40 mg by mouth daily as needed for fluid.     Marland Kitchen lisinopril (PRINIVIL,ZESTRIL) 20 MG tablet Take 20 mg by mouth daily.     . methocarbamol (ROBAXIN) 500 MG tablet Take 1 tablet (500 mg total) by mouth every 6 (six) hours as needed for muscle spasms. 40 tablet 0  . Multiple Vitamin (MULTIVITAMIN WITH MINERALS) TABS Take 1 tablet by mouth daily.    . nabumetone (RELAFEN) 750 MG tablet Take 750 mg by mouth 2 (two) times daily as needed for mild pain.   5  . sucralfate (CARAFATE) 1 g tablet Take 1 g by mouth 3 (three) times daily as needed (ulcer).     . thiamine (VITAMIN B-1) 100 MG tablet Take 100 mg by mouth daily.    . vitamin B-12 (CYANOCOBALAMIN) 500 MCG tablet Take 500 mcg by mouth daily.    . vitamin C (ASCORBIC ACID) 500 MG tablet Take 500 mg by mouth daily.     No current facility-administered medications on file prior to visit.     PAST MEDICAL HISTORY: Past Medical History:  Diagnosis Date  . Adenomatous colon polyp   . Anemia yrs ago   PMH  . Anxiety   . Cataract   . Constipation due to pain medication   . Edema of lower extremity    Takes lasix PRN  . Gallstones   . Gastritis   . GERD (gastroesophageal reflux disease)    with meds  . Hiatal hernia   . History of recurrent UTIs   . Hyperlipidemia   . Hypertension   . IBS (irritable bowel syndrome)   . Interstitial cystitis   . Loose, teeth    in the front of mouth    . Osteoarthritis   . Osteoporosis    osteoarthritis  . Pre-diabetes   . Pseudogout   . Urgency of urination     PAST SURGICAL HISTORY: Past Surgical History:  Procedure Laterality Date  . ANKLE SURGERY Right   . BREAST LUMPECTOMY Left 1996   fibroid  . COLONOSCOPY W/ POLYPECTOMY    . COLONOSCOPY WITH PROPOFOL N/A 09/02/2016   Procedure: COLONOSCOPY WITH PROPOFOL;  Surgeon: Milus Banister, MD;  Location: [facility];  Service: Endoscopy;  Laterality: N/A;  . EUS N/A 02/06/2015   Procedure: UPPER ENDOSCOPIC ULTRASOUND (EUS) LINEAR;  Surgeon: Milus Banister, MD;  Location: [facility];  Service: Endoscopy;  Laterality: N/A;  .  EUS N/A 09/02/2016   Procedure: UPPER ENDOSCOPIC ULTRASOUND (EUS) LINEAR;  Surgeon: Milus Banister, MD;  Location: [facility];  Service: Endoscopy;  Laterality: N/A;  . EYE SURGERY Bilateral    lasix 14 yrs ago  . JOINT REPLACEMENT    . PARTIAL KNEE ARTHROPLASTY Left 11/26/2014   Procedure: LEFT UNICOMPARTMENTAL KNEE;  Surgeon: Renette Butters, MD;  Location: [place];  Service: Orthopedics;  Laterality: Left;  . TOTAL KNEE ARTHROPLASTY Right 04/09/2014   DR MURPHY  . TOTAL KNEE ARTHROPLASTY Right 04/09/2014   Procedure: TOTAL KNEE ARTHROPLASTY;  Surgeon: Renette Butters, MD;  Location: [place];  Service: Orthopedics;  Laterality: Right;  . TOTAL KNEE REVISION Left 11/03/2015   Procedure: TOTAL KNEE REVISION;  Surgeon: Renette Butters, MD;  Location: [place];  Service: Orthopedics;  Laterality: Left;    SOCIAL HISTORY: Social History   Tobacco Use  . Smoking status: Never Smoker  . Smokeless tobacco: Never Used  Substance Use Topics  . Alcohol use: No    Alcohol/week: 0.0 oz  . Drug use: No    FAMILY HISTORY: Family History  Problem Relation Age of Onset  . Pancreatic cancer Mother        head of pancreas removed   . Diabetes Mother   . Hypertension Mother   . Diabetes Father   . Heart disease Father   . Hypertension Father   . Diabetes  Maternal Grandmother   . Breast cancer Neg Hx   . Celiac disease Neg Hx   . Cirrhosis Neg Hx   . Clotting disorder Neg Hx   . Colitis Neg Hx   . Colon cancer Neg Hx   . Colon polyps Neg Hx   . Crohn's disease Neg Hx   . Cystic fibrosis Neg Hx   . Esophageal cancer Neg Hx   . Hemochromatosis Neg Hx   . Inflammatory bowel disease Neg Hx   . Irritable bowel syndrome Neg Hx   . Kidney disease Neg Hx   . Liver cancer Neg Hx   . Liver disease Neg Hx   . Ovarian cancer Neg Hx   . Prostate cancer Neg Hx   . Rectal cancer Neg Hx   . Stomach cancer Neg Hx   . Ulcerative colitis Neg Hx   . Uterine cancer Neg Hx   . Wilson's disease Neg Hx   . Goiter Neg Hx     ROS: Review of Systems  Constitutional: Positive for malaise/fatigue. Negative for weight loss.       + Trouble sleeping  HENT: Positive for tinnitus.   Eyes: Positive for blurred vision and double vision.       + Floaters  Respiratory: Positive for shortness of breath (with exertion).   Cardiovascular: Negative for chest pain and orthopnea.       + Leg cramping  Gastrointestinal: Positive for heartburn and nausea. Negative for vomiting.  Genitourinary: Positive for frequency.  Musculoskeletal: Positive for back pain.       Negative muscle weakness + Muscle or joint pain + Muscle stiffness + Neck stiffness  Skin: Positive for itching.       + Dryness + Hair or nail changes  Neurological: Positive for weakness.  Endo/Heme/Allergies: Bruises/bleeds easily.       Negative polyphagia  Psychiatric/Behavioral:       + Stress    PHYSICAL EXAM: Blood pressure (!) 147/83, pulse 71, temperature (!) 97.4 F (36.3 C), height 5\' 2"  (1.575 m), weight 263  lb (119.3 kg), SpO2 98 %. Body mass index is 48.1 kg/m. Physical Exam  Constitutional: She is oriented to person, place, and time. She appears well-developed and well-nourished.  HENT:  Head: Normocephalic and atraumatic.  Nose: Nose normal.  Eyes: EOM are normal. No  scleral icterus.  Neck: Normal range of motion. Neck supple. No thyromegaly present.  Cardiovascular: Normal rate and regular rhythm.  Pulmonary/Chest: Effort normal. No respiratory distress.  Abdominal: Soft. There is no tenderness.  Musculoskeletal:  Range of Motion normal in all 4 extremities Trace edema noted in bilateral lower extremities  Neurological: She is alert and oriented to person, place, and time. Coordination normal.  Skin: Skin is warm and dry.  Psychiatric: She has a normal mood and affect. Her behavior is normal.  Vitals reviewed.   RECENT LABS AND TESTS: BMET    Component Value Date/Time   NA 138 09/05/2017 0000   K 4.1 09/05/2017 0000   CL 101 09/05/2017 0000   CO2 24 09/05/2017 0000   GLUCOSE 139 (H) 09/05/2017 0000   GLUCOSE 156 (H) 05/04/2017 0034   BUN 9 09/05/2017 0000   CREATININE 0.64 09/05/2017 0000   CREATININE 0.65 01/22/2016 1130   CALCIUM 8.8 09/05/2017 0000   GFRNONAA 93 09/05/2017 0000   GFRAA 107 09/05/2017 0000   Lab Results  Component Value Date   HGBA1C 7.1 (H) 09/05/2017   Lab Results  Component Value Date   INSULIN 10.7 09/05/2017   CBC    Component Value Date/Time   WBC 4.0 09/05/2017 0000   WBC 6.0 05/04/2017 0034   RBC 4.49 09/05/2017 0000   RBC 4.46 05/04/2017 0034   HGB 13.7 09/05/2017 0000   HCT 41.8 09/05/2017 0000   PLT 210 05/04/2017 0034   MCV 93 09/05/2017 0000   MCH 30.5 09/05/2017 0000   MCH 30.0 05/04/2017 0034   MCHC 32.8 09/05/2017 0000   MCHC 32.8 05/04/2017 0034   RDW 13.2 09/05/2017 0000   LYMPHSABS 1.4 09/05/2017 0000   MONOABS 0.4 08/05/2016 1618   EOSABS 0.1 09/05/2017 0000   BASOSABS 0.0 09/05/2017 0000   Iron/TIBC/Ferritin/ %Sat No results found for: IRON, TIBC, FERRITIN, IRONPCTSAT Lipid Panel     Component Value Date/Time   CHOL 151 09/05/2017 0000   TRIG 103 09/05/2017 0000   HDL 44 09/05/2017 0000   LDLCALC 86 09/05/2017 0000   Hepatic Function Panel     Component Value  Date/Time   PROT 6.2 09/05/2017 0000   ALBUMIN 3.9 09/05/2017 0000   AST 13 09/05/2017 0000   ALT 6 09/05/2017 0000   ALKPHOS 76 09/05/2017 0000   BILITOT 0.5 09/05/2017 0000      Component Value Date/Time   TSH 0.343 (L) 09/05/2017 0000   TSH 0.60 01/07/2017 0930    ECG  shows NSR with a rate of 75 BPM INDIRECT CALORIMETER done today shows a VO2 of 250 and a REE of 1731.  Her calculated basal metabolic rate is 7035 thus her basal metabolic rate is better than expected.    ASSESSMENT AND PLAN: Other fatigue - Plan: EKG 12-Lead, CBC With Differential, Comprehensive metabolic panel, Lipid Panel With LDL/HDL Ratio, T3, T4, free, TSH  Shortness of breath on exertion - Plan: CBC With Differential  Essential hypertension  Vitamin D deficiency - Plan: VITAMIN D 25 Hydroxy (Vit-D Deficiency, Fractures)  Hyperglycemia - Plan: Hemoglobin A1c, Insulin, random  Depression screening  Class 3 severe obesity with serious comorbidity and body mass index (BMI) of 45.0  to 49.9 in adult, unspecified obesity type Jessica Good)  PLAN:  Fatigue Rabecca was informed that her fatigue may be related to obesity, depression or many other causes. Labs will be ordered, and in the meanwhile Laurieann has agreed to work on diet, exercise and weight loss to help with fatigue. Proper sleep hygiene was discussed including the need for 7-8 hours of quality sleep each night. A sleep study was not ordered based on symptoms and Epworth score.  Jessica on exertion Skai's shortness of breath appears to be obesity related and exercise induced. She has agreed to work on weight loss and gradually increase exercise to treat her exercise induced shortness of breath. If Analiyah follows our instructions and loses weight without improvement of her shortness of breath, we will plan to refer to pulmonology. We will monitor this condition regularly. Kyrsten agrees to this plan.  Hypertension We discussed sodium restriction, working on  healthy weight loss, and a regular exercise program as the means to achieve improved blood pressure control. Shamikia agreed with this plan and agreed to follow up as directed. We will continue to monitor her blood pressure as well as her progress with the above lifestyle modifications. She will continue her medications as prescribed and will watch for signs of hypotension as she continues her lifestyle modifications. We will check labs today and Ahana agrees to follow up with our clinic in 2 weeks.  Vitamin D Deficiency Jessica Good was informed that low vitamin D levels contributes to fatigue and are associated with obesity, breast, and colon cancer. She will follow up for routine testing of vitamin D, at least 2-3 times per year. She was informed of the risk of over-replacement of vitamin D and agrees to not increase her dose unless she discusses this with [place] first. We will check labs today and Jessica Good agrees to follow up with our clinic in 2 weeks.  Hyperglycemia Fasting labs will be obtained today and results with be discussed with Jessica Huguenin in 2 weeks at her follow up visit. In the meanwhile Ulyssa was started on a lower simple carbohydrate diet and will work on weight loss efforts.  Depression Screen Jessica Good had a strongly positive depression screening. Depression is commonly associated with obesity and often results in emotional eating behaviors. We will monitor this closely and work on CBT to help improve the non-hunger eating patterns. Referral to Psychology may be required if no improvement is seen as she continues in our clinic.  Obesity Ryleeann is currently in the action stage of change and her goal is to continue with weight loss efforts. I recommend Micki begin the structured treatment plan as follows:  She has agreed to follow the Category 2 plan Kameryn has been instructed to eventually work up to a goal of 150 minutes of combined cardio and strengthening exercise per week for weight loss and  overall health benefits. We discussed the following Behavioral Modification Strategies today: increasing lean protein intake, increasing vegetables, work on meal planning and easy cooking plans, and planning for success   She was informed of the importance of frequent follow up visits to maximize her success with intensive lifestyle modifications for her multiple health conditions. She was informed we would discuss her lab results at her next visit unless there is a critical issue that needs to be addressed sooner. Miosha agreed to keep her next visit at the agreed upon time to discuss these results.    OBESITY BEHAVIORAL INTERVENTION VISIT  Today's visit was # 1 out of  22.  Starting weight: 263 lbs Starting date: 09/05/17 Today's weight : 263 lbs Today's date: 09/05/2017 Total lbs lost to date: 0 (Patients must lose 7 lbs in the first 6 months to continue with counseling)   ASK: We discussed the diagnosis of obesity with [name] today and Juletta agreed to give [place] permission to discuss obesity behavioral modification therapy today.  ASSESS: Opaline has the diagnosis of obesity and her BMI today is 48.09 Savon is in the action stage of change   ADVISE: Roselind was educated on the multiple health risks of obesity as well as the benefit of weight loss to improve her health. She was advised of the need for long term treatment and the importance of lifestyle modifications.  AGREE: Multiple dietary modification options and treatment options were discussed and  Rayssa agreed to the above obesity treatment plan.   I, Trixie Dredge, am acting as transcriptionist for Ilene Qua, MD   I have reviewed the above documentation for accuracy and completeness, and I agree with the above. - Ilene Qua, MD

## 2017-09-21 ENCOUNTER — Ambulatory Visit (INDEPENDENT_AMBULATORY_CARE_PROVIDER_SITE_OTHER): Payer: Medicare Other | Admitting: Family Medicine

## 2017-09-21 VITALS — BP 152/58 | HR 83 | Temp 97.4°F | Ht 62.0 in | Wt 255.0 lb

## 2017-09-21 DIAGNOSIS — I10 Essential (primary) hypertension: Secondary | ICD-10-CM | POA: Diagnosis not present

## 2017-09-21 DIAGNOSIS — Z6841 Body Mass Index (BMI) 40.0 and over, adult: Secondary | ICD-10-CM | POA: Diagnosis not present

## 2017-09-21 DIAGNOSIS — E559 Vitamin D deficiency, unspecified: Secondary | ICD-10-CM

## 2017-09-21 DIAGNOSIS — E119 Type 2 diabetes mellitus without complications: Secondary | ICD-10-CM | POA: Diagnosis not present

## 2017-09-21 MED ORDER — LISINOPRIL 20 MG PO TABS: 20.0000 mg | ORAL_TABLET | Freq: Two times a day (BID) | ORAL | 0 refills | Status: DC

## 2017-09-21 MED ORDER — METFORMIN HCL 500 MG PO TABS: 500.0000 mg | ORAL_TABLET | Freq: Every day | ORAL | 0 refills | Status: DC

## 2017-09-21 NOTE — Progress Notes (Signed)
Office: [phone]  /  Fax: [phone]   HPI:   Chief Complaint: OBESITY Jessica Good is here to discuss her progress with her obesity treatment plan. She is on the Category 2 plan and is following her eating plan approximately 95 % of the time. She states she is exercising 0 minutes 0 times per week. Jessica Good states no hunger. Occasional nausea , struggles most with nausea at breakfast.  Her weight is 255 lb (115.7 kg) today and has had a weight loss of 8 pounds over a period of 2 weeks since her last visit. She has lost 8 lbs since starting treatment with Jessica Good.  Hypertension Jessica Good is a 67 y.o. female with hypertension. Jessica Good's blood pressure slightly elevated today. She denies chest pain. She is working weight loss to help control her blood pressure with the goal of decreasing her risk of heart attack and stroke. Jessica Good's blood pressure is not currently controlled.  Vitamin D Deficiency Jessica Good has a diagnosis of vitamin D deficiency. She is on supplement Vit D currently, can't recall the strength. She denies nausea, vomiting or muscle weakness.  Diabetes II Jessica Good has a diagnosis of diabetes type II. Jessica Good's Hgb A1c 7.1 (previously 6.7), fasting BGs range between 120 and 130. She denies any hypoglycemic episodes. She has been working on intensive lifestyle modifications including diet, exercise, and weight loss to help control her blood glucose levels.  ALLERGIES: Allergies  Allergen Reactions  . Demerol [Meperidine] Other (See Comments)    caused excessive sleepiness  . Latex Rash    Pt. Stated she has a " latex allergy" and it "gives her a rash".  . Sulfa Antibiotics Other (See Comments)    : Flu-like symptoms  . Adhesive [Tape] Rash    Reaction to bandaids    MEDICATIONS: Current Outpatient Medications on File Prior to Visit  Medication Sig Dispense Refill  . acetaminophen (TYLENOL) 500 MG tablet Take 500 mg by mouth every 6 (six) hours as needed for moderate pain.     Marland Kitchen alendronate (FOSAMAX) 70 MG tablet Take 70 mg by mouth once a week. Take with a full glass of water on an empty stomach.    Marland Kitchen aspirin 81 MG tablet Take 81 mg by mouth daily.    . calcium-vitamin D (OSCAL WITH D) 500-200 MG-UNIT tablet Take 1 tablet by mouth daily.    . cholecalciferol (VITAMIN D) 1000 UNITS tablet Take 1,000 Units by mouth daily.    . furosemide (LASIX) 40 MG tablet Take 40 mg by mouth daily as needed for fluid.     . methocarbamol (ROBAXIN) 500 MG tablet Take 1 tablet (500 mg total) by mouth every 6 (six) hours as needed for muscle spasms. 40 tablet 0  . Multiple Vitamin (MULTIVITAMIN WITH MINERALS) TABS Take 1 tablet by mouth daily.    . nabumetone (RELAFEN) 750 MG tablet Take 750 mg by mouth 2 (two) times daily as needed for mild pain.   5  . sucralfate (CARAFATE) 1 g tablet Take 1 g by mouth 3 (three) times daily as needed (ulcer).     . thiamine (VITAMIN B-1) 100 MG tablet Take 100 mg by mouth daily.    . vitamin B-12 (CYANOCOBALAMIN) 500 MCG tablet Take 500 mcg by mouth daily.    . vitamin C (ASCORBIC ACID) 500 MG tablet Take 500 mg by mouth daily.     No current facility-administered medications on file prior to visit.     PAST MEDICAL HISTORY: Past Medical  History:  Diagnosis Date  . Adenomatous colon polyp   . Anemia yrs ago   PMH  . Anxiety   . Cataract   . Constipation due to pain medication   . Edema of lower extremity    Takes lasix PRN  . Gallstones   . Gastritis   . GERD (gastroesophageal reflux disease)    with meds  . Hiatal hernia   . History of recurrent UTIs   . Hyperlipidemia   . Hypertension   . IBS (irritable bowel syndrome)   . Interstitial cystitis   . Loose, teeth    in the front of mouth  . Osteoarthritis   . Osteoporosis    osteoarthritis  . Pre-diabetes   . Pseudogout   . Urgency of urination     PAST SURGICAL HISTORY: Past Surgical History:  Procedure Laterality Date  . ANKLE SURGERY Right   . BREAST LUMPECTOMY  Left 1996   fibroid  . COLONOSCOPY W/ POLYPECTOMY    . COLONOSCOPY WITH PROPOFOL N/A 09/02/2016   Procedure: COLONOSCOPY WITH PROPOFOL;  Surgeon: Milus Banister, MD;  Location: [facility];  Service: Endoscopy;  Laterality: N/A;  . EUS N/A 02/06/2015   Procedure: UPPER ENDOSCOPIC ULTRASOUND (EUS) LINEAR;  Surgeon: Milus Banister, MD;  Location: [facility];  Service: Endoscopy;  Laterality: N/A;  . EUS N/A 09/02/2016   Procedure: UPPER ENDOSCOPIC ULTRASOUND (EUS) LINEAR;  Surgeon: Milus Banister, MD;  Location: [facility];  Service: Endoscopy;  Laterality: N/A;  . EYE SURGERY Bilateral    lasix 14 yrs ago  . JOINT REPLACEMENT    . PARTIAL KNEE ARTHROPLASTY Left 11/26/2014   Procedure: LEFT UNICOMPARTMENTAL KNEE;  Surgeon: Renette Butters, MD;  Location: [place];  Service: Orthopedics;  Laterality: Left;  . TOTAL KNEE ARTHROPLASTY Right 04/09/2014   DR MURPHY  . TOTAL KNEE ARTHROPLASTY Right 04/09/2014   Procedure: TOTAL KNEE ARTHROPLASTY;  Surgeon: Renette Butters, MD;  Location: [place];  Service: Orthopedics;  Laterality: Right;  . TOTAL KNEE REVISION Left 11/03/2015   Procedure: TOTAL KNEE REVISION;  Surgeon: Renette Butters, MD;  Location: [place];  Service: Orthopedics;  Laterality: Left;    SOCIAL HISTORY: Social History   Tobacco Use  . Smoking status: Never Smoker  . Smokeless tobacco: Never Used  Substance Use Topics  . Alcohol use: No    Alcohol/week: 0.0 oz  . Drug use: No    FAMILY HISTORY: Family History  Problem Relation Age of Onset  . Pancreatic cancer Mother        head of pancreas removed   . Diabetes Mother   . Hypertension Mother   . Diabetes Father   . Heart disease Father   . Hypertension Father   . Diabetes Maternal Grandmother   . Breast cancer Neg Hx   . Celiac disease Neg Hx   . Cirrhosis Neg Hx   . Clotting disorder Neg Hx   . Colitis Neg Hx   . Colon cancer Neg Hx   . Colon polyps Neg Hx   . Crohn's disease Neg Hx   . Cystic fibrosis Neg  Hx   . Esophageal cancer Neg Hx   . Hemochromatosis Neg Hx   . Inflammatory bowel disease Neg Hx   . Irritable bowel syndrome Neg Hx   . Kidney disease Neg Hx   . Liver cancer Neg Hx   . Liver disease Neg Hx   . Ovarian cancer Neg Hx   . Prostate  cancer Neg Hx   . Rectal cancer Neg Hx   . Stomach cancer Neg Hx   . Ulcerative colitis Neg Hx   . Uterine cancer Neg Hx   . Wilson's disease Neg Hx   . Goiter Neg Hx     ROS: Review of Systems  Constitutional: Positive for weight loss.  Cardiovascular: Negative for chest pain.  Gastrointestinal: Negative for nausea and vomiting.  Musculoskeletal:       Negative muscle weakness  Endo/Heme/Allergies:       Negative hypoglycemia    PHYSICAL EXAM: Blood pressure (!) 152/58, pulse 83, temperature (!) 97.4 F (36.3 C), temperature source Oral, height 5\' 2"  (1.575 m), weight 255 lb (115.7 kg), SpO2 95 %. Body mass index is 46.64 kg/m. Physical Exam  Constitutional: She is oriented to person, place, and time. She appears well-developed and well-nourished.  Cardiovascular: Normal rate.  Pulmonary/Chest: Effort normal.  Musculoskeletal: Normal range of motion.  Neurological: She is oriented to person, place, and time.  Skin: Skin is warm and dry.  Psychiatric: She has a normal mood and affect. Her behavior is normal.  Vitals reviewed.   RECENT LABS AND TESTS: BMET    Component Value Date/Time   NA 138 09/05/2017 0000   K 4.1 09/05/2017 0000   CL 101 09/05/2017 0000   CO2 24 09/05/2017 0000   GLUCOSE 139 (H) 09/05/2017 0000   GLUCOSE 156 (H) 05/04/2017 0034   BUN 9 09/05/2017 0000   CREATININE 0.64 09/05/2017 0000   CREATININE 0.65 01/22/2016 1130   CALCIUM 8.8 09/05/2017 0000   GFRNONAA 93 09/05/2017 0000   GFRAA 107 09/05/2017 0000   Lab Results  Component Value Date   HGBA1C 7.1 (H) 09/05/2017   HGBA1C 6.7 (H) 10/27/2015   HGBA1C 6.3 (H) 11/18/2014   Lab Results  Component Value Date   INSULIN 10.7 09/05/2017    CBC    Component Value Date/Time   WBC 4.0 09/05/2017 0000   WBC 6.0 05/04/2017 0034   RBC 4.49 09/05/2017 0000   RBC 4.46 05/04/2017 0034   HGB 13.7 09/05/2017 0000   HCT 41.8 09/05/2017 0000   PLT 210 05/04/2017 0034   MCV 93 09/05/2017 0000   MCH 30.5 09/05/2017 0000   MCH 30.0 05/04/2017 0034   MCHC 32.8 09/05/2017 0000   MCHC 32.8 05/04/2017 0034   RDW 13.2 09/05/2017 0000   LYMPHSABS 1.4 09/05/2017 0000   MONOABS 0.4 08/05/2016 1618   EOSABS 0.1 09/05/2017 0000   BASOSABS 0.0 09/05/2017 0000   Iron/TIBC/Ferritin/ %Sat No results found for: IRON, TIBC, FERRITIN, IRONPCTSAT Lipid Panel     Component Value Date/Time   CHOL 151 09/05/2017 0000   TRIG 103 09/05/2017 0000   HDL 44 09/05/2017 0000   LDLCALC 86 09/05/2017 0000   Hepatic Function Panel     Component Value Date/Time   PROT 6.2 09/05/2017 0000   ALBUMIN 3.9 09/05/2017 0000   AST 13 09/05/2017 0000   ALT 6 09/05/2017 0000   ALKPHOS 76 09/05/2017 0000   BILITOT 0.5 09/05/2017 0000      Component Value Date/Time   TSH 0.343 (L) 09/05/2017 0000   TSH 0.60 01/07/2017 0930  Results for MIKIYAH, GLASNER (MRN [id number]) as of 09/22/2017 07:42  Ref. Range 09/05/2017 00:00  Vitamin D, 25-Hydroxy Latest Ref Range: 30.0 - 100.0 ng/mL 55.1    ASSESSMENT AND PLAN: Essential hypertension - Plan: lisinopril (PRINIVIL,ZESTRIL) 20 MG tablet  Vitamin D deficiency  Type 2 diabetes mellitus  without complication, without long-term current use of insulin (Jessica Good) - Plan: metFORMIN (GLUCOPHAGE) 500 MG tablet  Class 3 severe obesity with serious comorbidity and body mass index (BMI) of 45.0 to 49.9 in adult, unspecified obesity type (Jessica Good)  PLAN:  Hypertension We discussed sodium restriction, working on healthy weight loss, and a regular exercise program as the means to achieve improved blood pressure control. Jessica Good agreed with this plan and agreed to follow up as directed. We will continue to monitor her blood  pressure as well as her progress with the above lifestyle modifications. Jessica Good agrees to start lisinopril 20 mg PO BID #60 with no refills. She is to check blood pressure 2-3 times per week and send a MyChart message if systolic <299. She will watch for signs of hypotension as she continues her lifestyle modifications. Jessica Good agrees to follow up with our clinic in 2 weeks.  Vitamin D Deficiency Ezell was informed that low vitamin D levels contributes to fatigue and are associated with obesity, breast, and colon cancer. Patte agrees to continue taking OTC Vit D supplement and will follow up for routine testing of vitamin D, at least 2-3 times per year. She was informed of the risk of over-replacement of vitamin D and agrees to not increase her dose unless she discusses this with Jessica Good first. Jessica Good agrees to follow up with our clinic in 2 weeks.  Diabetes II Jessica Good has been given extensive diabetes education by myself today including ideal fasting and post-prandial blood glucose readings, individual ideal Hgb A1c goals and hypoglycemia prevention. We discussed the importance of good blood sugar control to decrease the likelihood of diabetic complications such as nephropathy, neuropathy, limb loss, blindness, coronary artery disease, and death. We discussed the importance of intensive lifestyle modification including diet, exercise and weight loss as the first line treatment for diabetes. Jessica Good agrees to continue start metformin 500 mg PO q AM #30 with no refills. Jessica Good agrees to follow up with our clinic in 2 weeks.  Obesity Jessica Good is currently in the action stage of change. As such, her goal is to continue with weight loss efforts She has agreed to follow the Category 2 plan Jessica Good has been instructed to work up to a goal of 150 minutes of combined cardio and strengthening exercise per week for weight loss and overall health benefits. We discussed the following Behavioral Modification Strategies  today: increasing lean protein intake, work on meal planning and easy cooking plans, increase H20 intake, keeping healthy foods in the home, and planning for success   Jadis has agreed to follow up with our clinic in 2 weeks. She was informed of the importance of frequent follow up visits to maximize her success with intensive lifestyle modifications for her multiple health conditions.   OBESITY BEHAVIORAL INTERVENTION VISIT  Today's visit was # 2 out of 22.  Starting weight: 263 lbs Starting date: 09/05/17 Today's weight : 255 lbs Today's date: 09/21/2017 Total lbs lost to date: 8 (Patients must lose 7 lbs in the first 6 months to continue with counseling)   ASK: We discussed the diagnosis of obesity with Jessica Good today and Jessica Good agreed to give Jessica Good permission to discuss obesity behavioral modification therapy today.  ASSESS: Treasure has the diagnosis of obesity and her BMI today is 46.63 Denetria is in the action stage of change   ADVISE: Danyella was educated on the multiple health risks of obesity as well as the benefit of weight loss to improve her health. She was  advised of the need for long term treatment and the importance of lifestyle modifications.  AGREE: Multiple dietary modification options and treatment options were discussed and  Danne agreed to the above obesity treatment plan.  I, Trixie Dredge, am acting as transcriptionist for Ilene Qua, MD  I have reviewed the above documentation for accuracy and completeness, and I agree with the above. - Ilene Qua, MD

## 2017-09-28 DIAGNOSIS — M65311 Trigger thumb, right thumb: Secondary | ICD-10-CM | POA: Diagnosis not present

## 2017-09-28 DIAGNOSIS — M48061 Spinal stenosis, lumbar region without neurogenic claudication: Secondary | ICD-10-CM | POA: Diagnosis not present

## 2017-09-28 DIAGNOSIS — M810 Age-related osteoporosis without current pathological fracture: Secondary | ICD-10-CM | POA: Diagnosis not present

## 2017-10-18 ENCOUNTER — Ambulatory Visit (INDEPENDENT_AMBULATORY_CARE_PROVIDER_SITE_OTHER): Payer: Medicare Other | Admitting: Family Medicine

## 2017-10-18 VITALS — BP 131/84 | HR 76 | Temp 97.8°F | Ht 62.0 in | Wt 254.0 lb

## 2017-10-18 DIAGNOSIS — Z6841 Body Mass Index (BMI) 40.0 and over, adult: Secondary | ICD-10-CM

## 2017-10-18 DIAGNOSIS — E119 Type 2 diabetes mellitus without complications: Secondary | ICD-10-CM

## 2017-10-18 DIAGNOSIS — I10 Essential (primary) hypertension: Secondary | ICD-10-CM

## 2017-10-18 MED ORDER — METFORMIN HCL 500 MG PO TABS: 500.0000 mg | ORAL_TABLET | Freq: Every day | ORAL | 0 refills | Status: DC

## 2017-10-18 NOTE — Progress Notes (Signed)
Office: [phone]  /  Fax: [phone]   HPI:   Chief Complaint: OBESITY Jessica Good is here to discuss her progress with her obesity treatment plan. She is on the follow the Category 2 plan and is following her eating plan approximately 80 % of the time. She states she is walking for 10 minutes 5 times per week. Jessica Good has lots of life stressors with her son being ill and caring for her special needs brother. She wants to get back on track. Her weight is 254 lb (115.2 kg) today and has had a weight loss of 1 pound over a period of 4 weeks since her last visit. She has lost 9 lbs since starting treatment with [place].  Diabetes II Jessica Good has a diagnosis of diabetes type II. Jessica Good denies any GI symptoms on metformin. Last A1c was at 7.1 She has been working on intensive lifestyle modifications including diet, exercise, and weight loss to help control her blood glucose levels.  Hypertension Jessica Good is a 67 y.o. female with hypertension. She brought her blood pressure readings in today and for the past week blood pressure has been in the 120's/80's. Previously she had 3 systolic's in the high 762'G. Jessica Good denies chest pain , chest pressure or headache. She is working weight loss to help control her blood pressure with the goal of decreasing her risk of heart attack and stroke. Jessica Good blood pressure is controlled today.  ALLERGIES: Allergies  Allergen Reactions  . Demerol [Meperidine] Other (See Comments)    caused excessive sleepiness  . Latex Rash    Pt. Stated she has a " latex allergy" and it "gives her a rash".  . Sulfa Antibiotics Other (See Comments)    : Flu-like symptoms  . Adhesive [Tape] Rash    Reaction to bandaids    MEDICATIONS: Current Outpatient Medications on File Prior to Visit  Medication Sig Dispense Refill  . acetaminophen (TYLENOL) 500 MG tablet Take 500 mg by mouth every 6 (six) hours as needed for moderate pain.    Marland Kitchen alendronate (FOSAMAX) 70 MG  tablet Take 70 mg by mouth once a week. Take with a full glass of water on an empty stomach.    Marland Kitchen aspirin 81 MG tablet Take 81 mg by mouth daily.    . cholecalciferol (VITAMIN D) 1000 UNITS tablet Take 1,000 Units by mouth daily.    . furosemide (LASIX) 40 MG tablet Take 40 mg by mouth daily as needed for fluid.     Marland Kitchen ibuprofen (ADVIL,MOTRIN) 800 MG tablet Take 800 mg by mouth every 8 (eight) hours as needed.    Marland Kitchen lisinopril (PRINIVIL,ZESTRIL) 20 MG tablet Take 1 tablet (20 mg total) by mouth 2 (two) times daily. 60 tablet 0  . metFORMIN (GLUCOPHAGE) 500 MG tablet Take 1 tablet (500 mg total) by mouth daily with breakfast. 30 tablet 0  . methocarbamol (ROBAXIN) 500 MG tablet Take 1 tablet (500 mg total) by mouth every 6 (six) hours as needed for muscle spasms. 40 tablet 0  . Multiple Vitamin (MULTIVITAMIN WITH MINERALS) TABS Take 1 tablet by mouth daily.    . sucralfate (CARAFATE) 1 g tablet Take 1 g by mouth 3 (three) times daily as needed (ulcer).     . thiamine (VITAMIN B-1) 100 MG tablet Take 100 mg by mouth daily.    . vitamin B-12 (CYANOCOBALAMIN) 500 MCG tablet Take 500 mcg by mouth daily.    . vitamin C (ASCORBIC ACID) 500 MG tablet  Take 500 mg by mouth daily.     No current facility-administered medications on file prior to visit.     PAST MEDICAL HISTORY: Past Medical History:  Diagnosis Date  . Adenomatous colon polyp   . Anemia yrs ago   PMH  . Anxiety   . Cataract   . Constipation due to pain medication   . Edema of lower extremity    Takes lasix PRN  . Gallstones   . Gastritis   . GERD (gastroesophageal reflux disease)    with meds  . Hiatal hernia   . History of recurrent UTIs   . Hyperlipidemia   . Hypertension   . IBS (irritable bowel syndrome)   . Interstitial cystitis   . Loose, teeth    in the front of mouth  . Osteoarthritis   . Osteoporosis    osteoarthritis  . Pre-diabetes   . Pseudogout   . Urgency of urination     PAST SURGICAL HISTORY: Past  Surgical History:  Procedure Laterality Date  . ANKLE SURGERY Right   . BREAST LUMPECTOMY Left 1996   fibroid  . COLONOSCOPY W/ POLYPECTOMY    . COLONOSCOPY WITH PROPOFOL N/A 09/02/2016   Procedure: COLONOSCOPY WITH PROPOFOL;  Surgeon: Milus Banister, MD;  Location: [facility];  Service: Endoscopy;  Laterality: N/A;  . EUS N/A 02/06/2015   Procedure: UPPER ENDOSCOPIC ULTRASOUND (EUS) LINEAR;  Surgeon: Milus Banister, MD;  Location: [facility];  Service: Endoscopy;  Laterality: N/A;  . EUS N/A 09/02/2016   Procedure: UPPER ENDOSCOPIC ULTRASOUND (EUS) LINEAR;  Surgeon: Milus Banister, MD;  Location: [facility];  Service: Endoscopy;  Laterality: N/A;  . EYE SURGERY Bilateral    lasix 14 yrs ago  . JOINT REPLACEMENT    . PARTIAL KNEE ARTHROPLASTY Left 11/26/2014   Procedure: LEFT UNICOMPARTMENTAL KNEE;  Surgeon: Renette Butters, MD;  Location: [place];  Service: Orthopedics;  Laterality: Left;  . TOTAL KNEE ARTHROPLASTY Right 04/09/2014   DR MURPHY  . TOTAL KNEE ARTHROPLASTY Right 04/09/2014   Procedure: TOTAL KNEE ARTHROPLASTY;  Surgeon: Renette Butters, MD;  Location: [place];  Service: Orthopedics;  Laterality: Right;  . TOTAL KNEE REVISION Left 11/03/2015   Procedure: TOTAL KNEE REVISION;  Surgeon: Renette Butters, MD;  Location: [place];  Service: Orthopedics;  Laterality: Left;    SOCIAL HISTORY: Social History   Tobacco Use  . Smoking status: Never Smoker  . Smokeless tobacco: Never Used  Substance Use Topics  . Alcohol use: No    Alcohol/week: 0.0 oz  . Drug use: No    FAMILY HISTORY: Family History  Problem Relation Age of Onset  . Pancreatic cancer Mother        head of pancreas removed   . Diabetes Mother   . Hypertension Mother   . Diabetes Father   . Heart disease Father   . Hypertension Father   . Diabetes Maternal Grandmother   . Breast cancer Neg Hx   . Celiac disease Neg Hx   . Cirrhosis Neg Hx   . Clotting disorder Neg Hx   . Colitis Neg Hx   . Colon  cancer Neg Hx   . Colon polyps Neg Hx   . Crohn's disease Neg Hx   . Cystic fibrosis Neg Hx   . Esophageal cancer Neg Hx   . Hemochromatosis Neg Hx   . Inflammatory bowel disease Neg Hx   . Irritable bowel syndrome Neg Hx   . Kidney  disease Neg Hx   . Liver cancer Neg Hx   . Liver disease Neg Hx   . Ovarian cancer Neg Hx   . Prostate cancer Neg Hx   . Rectal cancer Neg Hx   . Stomach cancer Neg Hx   . Ulcerative colitis Neg Hx   . Uterine cancer Neg Hx   . Wilson's disease Neg Hx   . Goiter Neg Hx     ROS: Review of Systems  Constitutional: Positive for weight loss.  Cardiovascular: Negative for chest pain.       Negative for chest pressure  Gastrointestinal: Negative for diarrhea, nausea and vomiting.  Neurological: Negative for headaches.    PHYSICAL EXAM: Blood pressure 131/84, pulse 76, temperature 97.8 F (36.6 C), temperature source Oral, height 5\' 2"  (1.575 m), weight 254 lb (115.2 kg), SpO2 95 %. Body mass index is 46.46 kg/m. Physical Exam  Constitutional: She is oriented to person, place, and time. She appears well-developed and well-nourished.  Cardiovascular: Normal rate.  Pulmonary/Chest: Effort normal.  Musculoskeletal: Normal range of motion.  Neurological: She is oriented to person, place, and time.  Skin: Skin is warm and dry.  Psychiatric: She has a normal mood and affect. Her behavior is normal.  Vitals reviewed.   RECENT LABS AND TESTS: BMET    Component Value Date/Time   NA 138 09/05/2017 0000   K 4.1 09/05/2017 0000   CL 101 09/05/2017 0000   CO2 24 09/05/2017 0000   GLUCOSE 139 (H) 09/05/2017 0000   GLUCOSE 156 (H) 05/04/2017 0034   BUN 9 09/05/2017 0000   CREATININE 0.64 09/05/2017 0000   CREATININE 0.65 01/22/2016 1130   CALCIUM 8.8 09/05/2017 0000   GFRNONAA 93 09/05/2017 0000   GFRAA 107 09/05/2017 0000   Lab Results  Component Value Date   HGBA1C 7.1 (H) 09/05/2017   HGBA1C 6.7 (H) 10/27/2015   HGBA1C 6.3 (H) 11/18/2014    Lab Results  Component Value Date   INSULIN 10.7 09/05/2017   CBC    Component Value Date/Time   WBC 4.0 09/05/2017 0000   WBC 6.0 05/04/2017 0034   RBC 4.49 09/05/2017 0000   RBC 4.46 05/04/2017 0034   HGB 13.7 09/05/2017 0000   HCT 41.8 09/05/2017 0000   PLT 210 05/04/2017 0034   MCV 93 09/05/2017 0000   MCH 30.5 09/05/2017 0000   MCH 30.0 05/04/2017 0034   MCHC 32.8 09/05/2017 0000   MCHC 32.8 05/04/2017 0034   RDW 13.2 09/05/2017 0000   LYMPHSABS 1.4 09/05/2017 0000   MONOABS 0.4 08/05/2016 1618   EOSABS 0.1 09/05/2017 0000   BASOSABS 0.0 09/05/2017 0000   Iron/TIBC/Ferritin/ %Sat No results found for: IRON, TIBC, FERRITIN, IRONPCTSAT Lipid Panel     Component Value Date/Time   CHOL 151 09/05/2017 0000   TRIG 103 09/05/2017 0000   HDL 44 09/05/2017 0000   LDLCALC 86 09/05/2017 0000   Hepatic Function Panel     Component Value Date/Time   PROT 6.2 09/05/2017 0000   ALBUMIN 3.9 09/05/2017 0000   AST 13 09/05/2017 0000   ALT 6 09/05/2017 0000   ALKPHOS 76 09/05/2017 0000   BILITOT 0.5 09/05/2017 0000      Component Value Date/Time   TSH 0.343 (L) 09/05/2017 0000   TSH 0.60 01/07/2017 0930   Results for ZYANN, MABRY (MRN [id number]) as of 10/18/2017 08:32  Ref. Range 09/05/2017 00:00  Vitamin D, 25-Hydroxy Latest Ref Range: 30.0 - 100.0 ng/mL 55.1  ASSESSMENT AND PLAN: Type 2 diabetes mellitus without complication, without long-term current use of insulin ([place]) - Plan: metFORMIN (GLUCOPHAGE) 500 MG tablet  Essential hypertension  Class 3 severe obesity with serious comorbidity and body mass index (BMI) of 45.0 to 49.9 in adult, unspecified obesity type ([place])  PLAN:  Diabetes II Jessica Good has been given extensive diabetes education by myself today including ideal fasting and post-prandial blood glucose readings, individual ideal Hgb A1c goals and hypoglycemia prevention. We discussed the importance of good blood sugar control to decrease the  likelihood of diabetic complications such as nephropathy, neuropathy, limb loss, blindness, coronary artery disease, and death. We discussed the importance of intensive lifestyle modification including diet, exercise and weight loss as the first line treatment for diabetes. Jessica Good agrees to continue metformin 500 mg qAM #30 with no refills and will follow up at the agreed upon time.  Hypertension We discussed sodium restriction, working on healthy weight loss, and a regular exercise program as the means to achieve improved blood pressure control. Jessica Good agreed with this plan and agreed to follow up as directed. We will continue to monitor her blood pressure as well as her progress with the above lifestyle modifications. She will continue lisinopril and furosemide and will watch for signs of hypotension as she continues her lifestyle modifications.  Obesity Jessica Good is currently in the action stage of change. As such, her goal is to continue with weight loss efforts She has agreed to keep a food journal with 400 to 500 calories and 35+ grams of protein at supper daily and follow the Category 2 plan Jessica Good has been instructed to work up to a goal of 150 minutes of combined cardio and strengthening exercise per week for weight loss and overall health benefits. We discussed the following Behavioral Modification Strategies today: better snacking choices, planning for success, increasing lean protein intake, increasing vegetables and work on meal planning and easy cooking plans  Jessica Good has agreed to follow up with our clinic in 2 weeks. She was informed of the importance of frequent follow up visits to maximize her success with intensive lifestyle modifications for her multiple health conditions.   OBESITY BEHAVIORAL INTERVENTION VISIT  Today's visit was # 3 out of 22.  Starting weight: 263 lbs Starting date: 4/29/119 Today's weight : 254 lbs Today's date: 10/18/2017 Total lbs lost to date:  9 (Patients must lose 7 lbs in the first 6 months to continue with counseling)   ASK: We discussed the diagnosis of obesity with Jessica Good today and Jessica Good agreed to give [place] permission to discuss obesity behavioral modification therapy today.  ASSESS: Jessica Good has the diagnosis of obesity and her BMI today is 46.45 Jessica Good is in the action stage of change   ADVISE: Jessica Good was educated on the multiple health risks of obesity as well as the benefit of weight loss to improve her health. She was advised of the need for long term treatment and the importance of lifestyle modifications.  AGREE: Multiple dietary modification options and treatment options were discussed and  Jessica Good agreed to the above obesity treatment plan.  I, Doreene Nest, am acting as transcriptionist for Dennard Nip, MD  I have reviewed the above documentation for accuracy and completeness, and I agree with the above. - Ilene Qua, MD

## 2017-10-19 DIAGNOSIS — M25562 Pain in left knee: Secondary | ICD-10-CM | POA: Diagnosis not present

## 2017-11-01 ENCOUNTER — Ambulatory Visit (INDEPENDENT_AMBULATORY_CARE_PROVIDER_SITE_OTHER): Payer: Medicare Other | Admitting: Family Medicine

## 2017-11-01 VITALS — BP 143/81 | HR 64 | Temp 97.9°F | Ht 62.0 in | Wt 252.0 lb

## 2017-11-01 DIAGNOSIS — Z6841 Body Mass Index (BMI) 40.0 and over, adult: Secondary | ICD-10-CM

## 2017-11-01 DIAGNOSIS — I10 Essential (primary) hypertension: Secondary | ICD-10-CM | POA: Diagnosis not present

## 2017-11-01 DIAGNOSIS — E119 Type 2 diabetes mellitus without complications: Secondary | ICD-10-CM | POA: Diagnosis not present

## 2017-11-01 NOTE — Progress Notes (Signed)
Office: [phone]  /  Fax: [phone]   HPI:   Chief Complaint: OBESITY Jessica Good is here to discuss her progress with her obesity treatment plan. She is on the keep a food journal with 400 to 500 calories and 35+ grams of protein at supper daily and the Category 2 plan and is following her eating plan approximately 80 % of the time. She states she is exercising 0 minutes 0 times per week. Jessica Good had a weekend getaway this past weekend and indulged in a few sweets. She is trying to get back on track with following the structured plan. Her weight is 252 lb (114.3 kg) today and has had a weight loss of 2 pounds over a period of 2 weeks since her last visit. She has lost 11 lbs since starting treatment with [place].  Hypertension Jessica Good is a 67 y.o. female with hypertension.  Jessica Good denies chest pain, chest pressure or headache. She is working weight loss to help control her blood pressure with the goal of decreasing her risk of heart attack and stroke. Jessica Good blood pressure is controlled today (slightly elevated than previous).  Diabetes II Jessica Good has a diagnosis of diabetes type II. Jessica Good denies any GI side effects of metformin or any hypoglycemic episodes. Last A1c was at 7.1 She has been working on intensive lifestyle modifications including diet, exercise, and weight loss to help control her blood glucose levels.  Jessica Good: Jessica Good  Allergen Reactions  . Demerol [Meperidine] Other (See Comments)    caused excessive sleepiness  . Latex Rash    Pt. Stated she has a " latex allergy" and it "gives her a rash".  . Sulfa Antibiotics Other (See Comments)    : Flu-like symptoms  . Adhesive [Tape] Rash    Reaction to bandaids    MEDICATIONS: Current Outpatient Medications on File Prior to Visit  Medication Sig Dispense Refill  . acetaminophen (TYLENOL) 500 MG tablet Take 500 mg by mouth every 6 (six) hours as needed for moderate pain.    Jessica Good Kitchen alendronate (FOSAMAX) 70  MG tablet Take 70 mg by mouth once a week. Take with a full glass of water on an empty stomach.    Jessica Good Kitchen aspirin 81 MG tablet Take 81 mg by mouth daily.    . cholecalciferol (VITAMIN D) 1000 UNITS tablet Take 1,000 Units by mouth daily.    . furosemide (LASIX) 40 MG tablet Take 40 mg by mouth daily as needed for fluid.     Jessica Good Kitchen ibuprofen (ADVIL,MOTRIN) 800 MG tablet Take 800 mg by mouth every 8 (eight) hours as needed.    Jessica Good Kitchen lisinopril (PRINIVIL,ZESTRIL) 20 MG tablet Take 1 tablet (20 mg total) by mouth 2 (two) times daily. 60 tablet 0  . metFORMIN (GLUCOPHAGE) 500 MG tablet Take 1 tablet (500 mg total) by mouth daily with breakfast. 30 tablet 0  . methocarbamol (ROBAXIN) 500 MG tablet Take 1 tablet (500 mg total) by mouth every 6 (six) hours as needed for muscle spasms. 40 tablet 0  . Multiple Vitamin (MULTIVITAMIN WITH MINERALS) TABS Take 1 tablet by mouth daily.    . sucralfate (CARAFATE) 1 g tablet Take 1 g by mouth 3 (three) times daily as needed (ulcer).     . thiamine (VITAMIN B-1) 100 MG tablet Take 100 mg by mouth daily.    . vitamin B-12 (CYANOCOBALAMIN) 500 MCG tablet Take 500 mcg by mouth daily.    . vitamin C (ASCORBIC ACID) 500 MG tablet Take 500  mg by mouth daily.     No current facility-administered medications on file prior to visit.     PAST MEDICAL HISTORY: Past Medical History:  Diagnosis Date  . Adenomatous colon polyp   . Anemia yrs ago   PMH  . Anxiety   . Cataract   . Constipation due to pain medication   . Edema of lower extremity    Takes lasix PRN  . Gallstones   . Gastritis   . GERD (gastroesophageal reflux disease)    with meds  . Hiatal hernia   . History of recurrent UTIs   . Hyperlipidemia   . Hypertension   . IBS (irritable bowel syndrome)   . Interstitial cystitis   . Loose, teeth    in the front of mouth  . Osteoarthritis   . Osteoporosis    osteoarthritis  . Pre-diabetes   . Pseudogout   . Urgency of urination     PAST SURGICAL  HISTORY: Past Surgical History:  Procedure Laterality Date  . ANKLE SURGERY Right   . BREAST LUMPECTOMY Left 1996   fibroid  . COLONOSCOPY W/ POLYPECTOMY    . COLONOSCOPY WITH PROPOFOL N/A 09/02/2016   Procedure: COLONOSCOPY WITH PROPOFOL;  Surgeon: Milus Banister, MD;  Location: [facility];  Service: Endoscopy;  Laterality: N/A;  . EUS N/A 02/06/2015   Procedure: UPPER ENDOSCOPIC ULTRASOUND (EUS) LINEAR;  Surgeon: Milus Banister, MD;  Location: [facility];  Service: Endoscopy;  Laterality: N/A;  . EUS N/A 09/02/2016   Procedure: UPPER ENDOSCOPIC ULTRASOUND (EUS) LINEAR;  Surgeon: Milus Banister, MD;  Location: [facility];  Service: Endoscopy;  Laterality: N/A;  . EYE SURGERY Bilateral    lasix 14 yrs ago  . JOINT REPLACEMENT    . PARTIAL KNEE ARTHROPLASTY Left 11/26/2014   Procedure: LEFT UNICOMPARTMENTAL KNEE;  Surgeon: Renette Butters, MD;  Location: [place];  Service: Orthopedics;  Laterality: Left;  . TOTAL KNEE ARTHROPLASTY Right 04/09/2014   DR MURPHY  . TOTAL KNEE ARTHROPLASTY Right 04/09/2014   Procedure: TOTAL KNEE ARTHROPLASTY;  Surgeon: Renette Butters, MD;  Location: [place];  Service: Orthopedics;  Laterality: Right;  . TOTAL KNEE REVISION Left 11/03/2015   Procedure: TOTAL KNEE REVISION;  Surgeon: Renette Butters, MD;  Location: [place];  Service: Orthopedics;  Laterality: Left;    SOCIAL HISTORY: Social History   Tobacco Use  . Smoking status: Never Smoker  . Smokeless tobacco: Never Used  Substance Use Topics  . Alcohol use: No    Alcohol/week: 0.0 oz  . Drug use: No    FAMILY HISTORY: Family History  Problem Relation Age of Onset  . Pancreatic cancer Mother        head of pancreas removed   . Diabetes Mother   . Hypertension Mother   . Diabetes Father   . Heart disease Father   . Hypertension Father   . Diabetes Maternal Grandmother   . Breast cancer Neg Hx   . Celiac disease Neg Hx   . Cirrhosis Neg Hx   . Clotting disorder Neg Hx   . Colitis  Neg Hx   . Colon cancer Neg Hx   . Colon polyps Neg Hx   . Crohn's disease Neg Hx   . Cystic fibrosis Neg Hx   . Esophageal cancer Neg Hx   . Hemochromatosis Neg Hx   . Inflammatory bowel disease Neg Hx   . Irritable bowel syndrome Neg Hx   . Kidney disease Neg  Hx   . Liver cancer Neg Hx   . Liver disease Neg Hx   . Ovarian cancer Neg Hx   . Prostate cancer Neg Hx   . Rectal cancer Neg Hx   . Stomach cancer Neg Hx   . Ulcerative colitis Neg Hx   . Uterine cancer Neg Hx   . Wilson's disease Neg Hx   . Goiter Neg Hx     ROS: Review of Systems  Constitutional: Positive for weight loss.  Cardiovascular: Negative for chest pain.       Negative for chest pressure  Gastrointestinal: Negative for diarrhea, nausea and vomiting.  Neurological: Negative for headaches.  Endo/Heme/Jessica Good:       Negative for hypoglycemia    PHYSICAL EXAM: Blood pressure (!) 143/81, pulse 64, temperature 97.9 F (36.6 C), temperature source Oral, height 5\' 2"  (1.575 m), weight 252 lb (114.3 kg), SpO2 99 %. Body mass index is 46.09 kg/m. Physical Exam  Constitutional: She is oriented to person, place, and time. She appears well-developed and well-nourished.  Cardiovascular: Normal rate.  Pulmonary/Chest: Effort normal.  Musculoskeletal: Normal range of motion.  Neurological: She is oriented to person, place, and time.  Skin: Skin is warm and dry.  Psychiatric: She has a normal mood and affect. Her behavior is normal.  Vitals reviewed.   RECENT LABS AND TESTS: BMET    Component Value Date/Time   NA 138 09/05/2017 0000   K 4.1 09/05/2017 0000   CL 101 09/05/2017 0000   CO2 24 09/05/2017 0000   GLUCOSE 139 (H) 09/05/2017 0000   GLUCOSE 156 (H) 05/04/2017 0034   BUN 9 09/05/2017 0000   CREATININE 0.64 09/05/2017 0000   CREATININE 0.65 01/22/2016 1130   CALCIUM 8.8 09/05/2017 0000   GFRNONAA 93 09/05/2017 0000   GFRAA 107 09/05/2017 0000   Lab Results  Component Value Date   HGBA1C  7.1 (H) 09/05/2017   HGBA1C 6.7 (H) 10/27/2015   HGBA1C 6.3 (H) 11/18/2014   Lab Results  Component Value Date   INSULIN 10.7 09/05/2017   CBC    Component Value Date/Time   WBC 4.0 09/05/2017 0000   WBC 6.0 05/04/2017 0034   RBC 4.49 09/05/2017 0000   RBC 4.46 05/04/2017 0034   HGB 13.7 09/05/2017 0000   HCT 41.8 09/05/2017 0000   PLT 210 05/04/2017 0034   MCV 93 09/05/2017 0000   MCH 30.5 09/05/2017 0000   MCH 30.0 05/04/2017 0034   MCHC 32.8 09/05/2017 0000   MCHC 32.8 05/04/2017 0034   RDW 13.2 09/05/2017 0000   LYMPHSABS 1.4 09/05/2017 0000   MONOABS 0.4 08/05/2016 1618   EOSABS 0.1 09/05/2017 0000   BASOSABS 0.0 09/05/2017 0000   Iron/TIBC/Ferritin/ %Sat No results found for: IRON, TIBC, FERRITIN, IRONPCTSAT Lipid Panel     Component Value Date/Time   CHOL 151 09/05/2017 0000   TRIG 103 09/05/2017 0000   HDL 44 09/05/2017 0000   LDLCALC 86 09/05/2017 0000   Hepatic Function Panel     Component Value Date/Time   PROT 6.2 09/05/2017 0000   ALBUMIN 3.9 09/05/2017 0000   AST 13 09/05/2017 0000   ALT 6 09/05/2017 0000   ALKPHOS 76 09/05/2017 0000   BILITOT 0.5 09/05/2017 0000      Component Value Date/Time   TSH 0.343 (L) 09/05/2017 0000   TSH 0.60 01/07/2017 0930   Results for SUHAYLA, CHISOM (MRN [id number]) as of 11/01/2017 11:25  Ref. Range 09/05/2017 00:00  Vitamin D, 25-Hydroxy  Latest Ref Range: 30.0 - 100.0 ng/mL 55.1   ASSESSMENT AND PLAN: Essential hypertension  Type 2 diabetes mellitus without complication, without long-term current use of insulin (HCC)  Class 3 severe obesity with serious comorbidity and body mass index (BMI) of 45.0 to 49.9 in adult, unspecified obesity type ([place])  PLAN:  Hypertension We discussed sodium restriction, working on healthy weight loss, and a regular exercise program as the means to achieve improved blood pressure control. Jyssica agreed with this plan and agreed to follow up as directed. We will continue to  monitor her blood pressure as well as her progress with the above lifestyle modifications. She will continue Lisinopril as prescribed and will watch for signs of hypotension as she continues her lifestyle modifications.  Diabetes II Keerstin has been given extensive diabetes education by myself today including ideal fasting and post-prandial blood glucose readings, individual ideal Hgb A1c goals and hypoglycemia prevention. We discussed the importance of good blood sugar control to decrease the likelihood of diabetic complications such as nephropathy, neuropathy, limb loss, blindness, coronary artery disease, and death. We discussed the importance of intensive lifestyle modification including diet, exercise and weight loss as the first line treatment for diabetes. Estelle agrees to continue metformin (no refill needed) and will follow up at the agreed upon time.  We spent > than 50% of the 15 minute visit on the counseling as documented in the note.  Obesity Anastazia is currently in the action stage of change. As such, her goal is to continue with weight loss efforts She has agreed to follow the Category 2 plan Jalynne has been instructed to work up to a goal of 150 minutes of combined cardio and strengthening exercise per week for weight loss and overall health benefits. We discussed the following Behavioral Modification Strategies today: increasing lean protein intake, increasing vegetables, work on meal planning and easy cooking plans and travel eating strategies   Jahaira has agreed to follow up with our clinic in 2 weeks. She was informed of the importance of frequent follow up visits to maximize her success with intensive lifestyle modifications for her multiple health conditions.   OBESITY BEHAVIORAL INTERVENTION VISIT  Today's visit was # 4 out of 22.  Starting weight: 263 lbs Starting date: 09/05/17 Today's weight : 252 lbs  Today's date: 11/01/2017 Total lbs lost to date: 11 (Patients must  lose 7 lbs in the first 6 months to continue with counseling)   ASK: We discussed the diagnosis of obesity with [name] today and Landra agreed to give [place] permission to discuss obesity behavioral modification therapy today.  ASSESS: Azarie has the diagnosis of obesity and her BMI today is 46.08 Avigayil is in the action stage of change   ADVISE: Zaidy was educated on the multiple health risks of obesity as well as the benefit of weight loss to improve her health. She was advised of the need for long term treatment and the importance of lifestyle modifications.  AGREE: Multiple dietary modification options and treatment options were discussed and  Florinda agreed to the above obesity treatment plan.  Jessica Good, Jessica Good, am acting as transcriptionist for Eber Jones, MD  Jessica Good have reviewed the above documentation for accuracy and completeness, and Jessica Good agree with the above. - Jessica Qua, MD

## 2017-11-03 DIAGNOSIS — M19071 Primary osteoarthritis, right ankle and foot: Secondary | ICD-10-CM | POA: Diagnosis not present

## 2017-11-08 DIAGNOSIS — L989 Disorder of the skin and subcutaneous tissue, unspecified: Secondary | ICD-10-CM | POA: Diagnosis not present

## 2017-11-08 DIAGNOSIS — F419 Anxiety disorder, unspecified: Secondary | ICD-10-CM | POA: Diagnosis not present

## 2017-11-08 DIAGNOSIS — R300 Dysuria: Secondary | ICD-10-CM | POA: Diagnosis not present

## 2017-11-21 ENCOUNTER — Ambulatory Visit (INDEPENDENT_AMBULATORY_CARE_PROVIDER_SITE_OTHER): Payer: Medicare Other | Admitting: Family Medicine

## 2017-11-21 VITALS — BP 140/79 | HR 71 | Temp 98.3°F | Ht 62.0 in | Wt 251.0 lb

## 2017-11-21 DIAGNOSIS — Z6841 Body Mass Index (BMI) 40.0 and over, adult: Secondary | ICD-10-CM | POA: Diagnosis not present

## 2017-11-21 DIAGNOSIS — E119 Type 2 diabetes mellitus without complications: Secondary | ICD-10-CM

## 2017-11-21 DIAGNOSIS — I10 Essential (primary) hypertension: Secondary | ICD-10-CM | POA: Diagnosis not present

## 2017-11-21 MED ORDER — METFORMIN HCL 500 MG PO TABS: 500.0000 mg | ORAL_TABLET | Freq: Every day | ORAL | 0 refills | Status: DC

## 2017-11-21 NOTE — Progress Notes (Signed)
Office: [phone]  /  Fax: [phone]   HPI:   Chief Complaint: OBESITY Jessica Good is here to discuss her progress with her obesity treatment plan. She is on the Category 2 plan and is following her eating plan approximately 80 % of the time. She states she is walking 500 yards 4 times per week. Jessica Good has lots of significant family stress and struggled with emotional eating and did not eat as much as she was supposed to. Her weight is 251 lb (113.9 kg) today and has had a weight loss of 1 pound over a period of 3 weeks since her last visit. She has lost 12 lbs since starting treatment with [place].  Hypertension Jessica Good is a 67 y.o. female with hypertension.She brought in BP readings and systolic range is between 92 to 134 over 65 to 91 (majority in 94'T to 654'Y systolic) Jessica Good denies chest pain or shortness of breath on exertion. She is working weight loss to help control her blood pressure with the goal of decreasing her risk of heart attack and stroke. Jessica Good's blood pressure is currently controlled.  Diabetes II Jessica Good has a diagnosis of diabetes type II. She is stress eating secondary to significant stress with her sisters health issues.  Jessica Good  denies any hypoglycemic episodes. Last A1c was 7.1.  She has been working on intensive lifestyle modifications including diet, exercise, and weight loss to help control her blood glucose levels.  ALLERGIES: Allergies  Allergen Reactions  . Demerol [Meperidine] Other (See Comments)    caused excessive sleepiness  . Latex Rash    Pt. Stated she has a " latex allergy" and it "gives her a rash".  . Sulfa Antibiotics Other (See Comments)    : Flu-like symptoms  . Adhesive [Tape] Rash    Reaction to bandaids    MEDICATIONS: Current Outpatient Medications on File Prior to Visit  Medication Sig Dispense Refill  . acetaminophen (TYLENOL) 500 MG tablet Take 500 mg by mouth every 6 (six) hours as needed for moderate pain.      Marland Kitchen alendronate (FOSAMAX) 70 MG tablet Take 70 mg by mouth once a week. Take with a full glass of water on an empty stomach.    Marland Kitchen aspirin 81 MG tablet Take 81 mg by mouth daily.    . cholecalciferol (VITAMIN D) 1000 UNITS tablet Take 1,000 Units by mouth daily.    . furosemide (LASIX) 40 MG tablet Take 40 mg by mouth daily as needed for fluid.     Marland Kitchen ibuprofen (ADVIL,MOTRIN) 800 MG tablet Take 800 mg by mouth every 8 (eight) hours as needed.    Marland Kitchen lisinopril (PRINIVIL,ZESTRIL) 20 MG tablet Take 1 tablet (20 mg total) by mouth 2 (two) times daily. (Patient taking differently: Take 20 mg by mouth daily. ) 60 tablet 0  . Multiple Vitamin (MULTIVITAMIN WITH MINERALS) TABS Take 1 tablet by mouth daily.    . sucralfate (CARAFATE) 1 g tablet Take 1 g by mouth 3 (three) times daily as needed (ulcer).     . thiamine (VITAMIN B-1) 100 MG tablet Take 100 mg by mouth daily.    . vitamin B-12 (CYANOCOBALAMIN) 500 MCG tablet Take 500 mcg by mouth daily.    . vitamin C (ASCORBIC ACID) 500 MG tablet Take 500 mg by mouth daily.     No current facility-administered medications on file prior to visit.     PAST MEDICAL HISTORY: Past Medical History:  Diagnosis Date  . Adenomatous  colon polyp   . Anemia yrs ago   PMH  . Anxiety   . Cataract   . Constipation due to pain medication   . Edema of lower extremity    Takes lasix PRN  . Gallstones   . Gastritis   . GERD (gastroesophageal reflux disease)    with meds  . Hiatal hernia   . History of recurrent UTIs   . Hyperlipidemia   . Hypertension   . IBS (irritable bowel syndrome)   . Interstitial cystitis   . Loose, teeth    in the front of mouth  . Osteoarthritis   . Osteoporosis    osteoarthritis  . Pre-diabetes   . Pseudogout   . Urgency of urination     PAST SURGICAL HISTORY: Past Surgical History:  Procedure Laterality Date  . ANKLE SURGERY Right   . BREAST LUMPECTOMY Left 1996   fibroid  . COLONOSCOPY W/ POLYPECTOMY    . COLONOSCOPY  WITH PROPOFOL N/A 09/02/2016   Procedure: COLONOSCOPY WITH PROPOFOL;  Surgeon: Milus Banister, MD;  Location: [facility];  Service: Endoscopy;  Laterality: N/A;  . EUS N/A 02/06/2015   Procedure: UPPER ENDOSCOPIC ULTRASOUND (EUS) LINEAR;  Surgeon: Milus Banister, MD;  Location: [facility];  Service: Endoscopy;  Laterality: N/A;  . EUS N/A 09/02/2016   Procedure: UPPER ENDOSCOPIC ULTRASOUND (EUS) LINEAR;  Surgeon: Milus Banister, MD;  Location: [facility];  Service: Endoscopy;  Laterality: N/A;  . EYE SURGERY Bilateral    lasix 14 yrs ago  . JOINT REPLACEMENT    . PARTIAL KNEE ARTHROPLASTY Left 11/26/2014   Procedure: LEFT UNICOMPARTMENTAL KNEE;  Surgeon: Renette Butters, MD;  Location: [place];  Service: Orthopedics;  Laterality: Left;  . TOTAL KNEE ARTHROPLASTY Right 04/09/2014   DR MURPHY  . TOTAL KNEE ARTHROPLASTY Right 04/09/2014   Procedure: TOTAL KNEE ARTHROPLASTY;  Surgeon: Renette Butters, MD;  Location: [place];  Service: Orthopedics;  Laterality: Right;  . TOTAL KNEE REVISION Left 11/03/2015   Procedure: TOTAL KNEE REVISION;  Surgeon: Renette Butters, MD;  Location: [place];  Service: Orthopedics;  Laterality: Left;    SOCIAL HISTORY: Social History   Tobacco Use  . Smoking status: Never Smoker  . Smokeless tobacco: Never Used  Substance Use Topics  . Alcohol use: No    Alcohol/week: 0.0 oz  . Drug use: No    FAMILY HISTORY: Family History  Problem Relation Age of Onset  . Pancreatic cancer Mother        head of pancreas removed   . Diabetes Mother   . Hypertension Mother   . Diabetes Father   . Heart disease Father   . Hypertension Father   . Diabetes Maternal Grandmother   . Breast cancer Neg Hx   . Celiac disease Neg Hx   . Cirrhosis Neg Hx   . Clotting disorder Neg Hx   . Colitis Neg Hx   . Colon cancer Neg Hx   . Colon polyps Neg Hx   . Crohn's disease Neg Hx   . Cystic fibrosis Neg Hx   . Esophageal cancer Neg Hx   . Hemochromatosis Neg Hx   .  Inflammatory bowel disease Neg Hx   . Irritable bowel syndrome Neg Hx   . Kidney disease Neg Hx   . Liver cancer Neg Hx   . Liver disease Neg Hx   . Ovarian cancer Neg Hx   . Prostate cancer Neg Hx   . Rectal  cancer Neg Hx   . Stomach cancer Neg Hx   . Ulcerative colitis Neg Hx   . Uterine cancer Neg Hx   . Wilson's disease Neg Hx   . Goiter Neg Hx     ROS: Review of Systems  Constitutional: Positive for weight loss.  Respiratory: Negative for shortness of breath (on exertion).   Cardiovascular: Negative for chest pain.  Endo/Heme/Allergies:       Negative for hypoglycemia    PHYSICAL EXAM: Blood pressure 140/79, pulse 71, temperature 98.3 F (36.8 C), temperature source Oral, height 5\' 2"  (1.575 m), weight 251 lb (113.9 kg), SpO2 97 %. Body mass index is 45.91 kg/m. Physical Exam  Constitutional: She is oriented to person, place, and time. She appears well-developed and well-nourished.  Cardiovascular: Normal rate.  Pulmonary/Chest: Effort normal.  Musculoskeletal: Normal range of motion.  Neurological: She is oriented to person, place, and time.  Skin: Skin is warm and dry.  Psychiatric: She has a normal mood and affect. Her behavior is normal.  Vitals reviewed.   RECENT LABS AND TESTS: BMET    Component Value Date/Time   NA 138 09/05/2017 0000   K 4.1 09/05/2017 0000   CL 101 09/05/2017 0000   CO2 24 09/05/2017 0000   GLUCOSE 139 (H) 09/05/2017 0000   GLUCOSE 156 (H) 05/04/2017 0034   BUN 9 09/05/2017 0000   CREATININE 0.64 09/05/2017 0000   CREATININE 0.65 01/22/2016 1130   CALCIUM 8.8 09/05/2017 0000   GFRNONAA 93 09/05/2017 0000   GFRAA 107 09/05/2017 0000   Lab Results  Component Value Date   HGBA1C 7.1 (H) 09/05/2017   HGBA1C 6.7 (H) 10/27/2015   HGBA1C 6.3 (H) 11/18/2014   Lab Results  Component Value Date   INSULIN 10.7 09/05/2017   CBC    Component Value Date/Time   WBC 4.0 09/05/2017 0000   WBC 6.0 05/04/2017 0034   RBC 4.49  09/05/2017 0000   RBC 4.46 05/04/2017 0034   HGB 13.7 09/05/2017 0000   HCT 41.8 09/05/2017 0000   PLT 210 05/04/2017 0034   MCV 93 09/05/2017 0000   MCH 30.5 09/05/2017 0000   MCH 30.0 05/04/2017 0034   MCHC 32.8 09/05/2017 0000   MCHC 32.8 05/04/2017 0034   RDW 13.2 09/05/2017 0000   LYMPHSABS 1.4 09/05/2017 0000   MONOABS 0.4 08/05/2016 1618   EOSABS 0.1 09/05/2017 0000   BASOSABS 0.0 09/05/2017 0000   Iron/TIBC/Ferritin/ %Sat No results found for: IRON, TIBC, FERRITIN, IRONPCTSAT Lipid Panel     Component Value Date/Time   CHOL 151 09/05/2017 0000   TRIG 103 09/05/2017 0000   HDL 44 09/05/2017 0000   LDLCALC 86 09/05/2017 0000   Hepatic Function Panel     Component Value Date/Time   PROT 6.2 09/05/2017 0000   ALBUMIN 3.9 09/05/2017 0000   AST 13 09/05/2017 0000   ALT 6 09/05/2017 0000   ALKPHOS 76 09/05/2017 0000   BILITOT 0.5 09/05/2017 0000      Component Value Date/Time   TSH 0.343 (L) 09/05/2017 0000   TSH 0.60 01/07/2017 0930   Results for Jessica Good, Jessica Good (MRN [id number]) as of 11/21/2017 14:26  Ref. Range 09/05/2017 00:00  Vitamin D, 25-Hydroxy Latest Ref Range: 30.0 - 100.0 ng/mL 55.1    ASSESSMENT AND PLAN: Essential hypertension  Type 2 diabetes mellitus without complication, without long-term current use of insulin (HCC) - Plan: metFORMIN (GLUCOPHAGE) 500 MG tablet  Class 3 severe obesity with serious comorbidity and body  mass index (BMI) of 45.0 to 49.9 in adult, unspecified obesity type ([place])  PLAN:  Hypertension We discussed sodium restriction, working on healthy weight loss, and a regular exercise program as the means to achieve improved blood pressure control. Ha agreed with this plan and agreed to follow up as directed. We will continue to monitor her blood pressure as well as her progress with the above lifestyle modifications. She agrees to decrease Lisinopril to 20mg  po daily and will watch for signs of hypotension as she continues her  lifestyle modifications.   Diabetes II Jessica Good has been given extensive diabetes education by myself today including ideal fasting and post-prandial blood glucose readings, individual ideal Hgb A1c goals  and hypoglycemia prevention. We discussed the importance of good blood sugar control to decrease the likelihood of diabetic complications such as nephropathy, neuropathy, limb loss, blindness, coronary artery disease, and death. We discussed the importance of intensive lifestyle modification including diet, exercise and weight loss as the first line treatment for diabetes. Jessica Good agrees to continue Metformin 500mg  daily #30 with no refills and follow up at the agreed upon time.  Obesity Jessica Good is currently in the action stage of change. As such, her goal is to continue with weight loss efforts She has agreed to follow the Category 2 plan Jessica Good has been instructed to work up to a goal of 150 minutes of combined cardio and strengthening exercise per week for weight loss and overall health benefits. We discussed the following Behavioral Modification Strategies today: increasing lean protein intake, work on meal planning and easy cooking plans,emotional eating strategies,  and planning for success.  Jessica Good has agreed to follow up with our clinic in 2 weeks. She was informed of the importance of frequent follow up visits to maximize her success with intensive lifestyle modifications for her multiple health conditions.   OBESITY BEHAVIORAL INTERVENTION VISIT  Today's visit was # 5 out of 22.  Starting weight: 263 lbs Starting date: 09/05/17 Today's weight : 251 lbs Today's date: 11/21/2017 Total lbs lost to date:12 (Patients must lose 7 lbs in the first 6 months to continue with counseling)   ASK: We discussed the diagnosis of obesity with Jessica Good today and Jessica Good agreed to give [place] permission to discuss obesity behavioral modification therapy today.  ASSESS: Jessica Good has the diagnosis  of obesity and her BMI today is 45.9. Jessica Good is in the action stage of change   ADVISE: Jessica Good was educated on the multiple health risks of obesity as well as the benefit of weight loss to improve her health. She was advised of the need for long term treatment and the importance of lifestyle modifications.  AGREE: Multiple dietary modification options and treatment options were discussed and  Jessica Good agreed to the above obesity treatment plan.  I, Doreene Nest, am acting as transcriptionist for Eber Jones, MD  I have reviewed the above documentation for accuracy and completeness, and I agree with the above. - Ilene Qua, MD

## 2017-12-05 ENCOUNTER — Ambulatory Visit (INDEPENDENT_AMBULATORY_CARE_PROVIDER_SITE_OTHER): Payer: Self-pay | Admitting: Family Medicine

## 2017-12-07 DIAGNOSIS — M19071 Primary osteoarthritis, right ankle and foot: Secondary | ICD-10-CM | POA: Diagnosis not present

## 2017-12-13 ENCOUNTER — Encounter: Payer: Self-pay | Admitting: Sports Medicine

## 2017-12-13 ENCOUNTER — Ambulatory Visit (INDEPENDENT_AMBULATORY_CARE_PROVIDER_SITE_OTHER): Payer: Medicare Other | Admitting: Sports Medicine

## 2017-12-13 ENCOUNTER — Ambulatory Visit (INDEPENDENT_AMBULATORY_CARE_PROVIDER_SITE_OTHER): Payer: Medicare Other | Admitting: Family Medicine

## 2017-12-13 VITALS — BP 138/72 | HR 76 | Temp 98.2°F | Ht 62.0 in | Wt 253.0 lb

## 2017-12-13 DIAGNOSIS — E119 Type 2 diabetes mellitus without complications: Secondary | ICD-10-CM | POA: Diagnosis not present

## 2017-12-13 DIAGNOSIS — Z6841 Body Mass Index (BMI) 40.0 and over, adult: Secondary | ICD-10-CM | POA: Diagnosis not present

## 2017-12-13 DIAGNOSIS — E66813 Obesity, class 3: Secondary | ICD-10-CM

## 2017-12-13 DIAGNOSIS — R269 Unspecified abnormalities of gait and mobility: Secondary | ICD-10-CM | POA: Diagnosis not present

## 2017-12-13 DIAGNOSIS — I10 Essential (primary) hypertension: Secondary | ICD-10-CM

## 2017-12-13 MED ORDER — METFORMIN HCL 500 MG PO TABS: 500.0000 mg | ORAL_TABLET | Freq: Every day | ORAL | 0 refills | Status: DC

## 2017-12-13 NOTE — Progress Notes (Signed)
Chief complaint left knee and left foot pain  Patient was put in orthotics In August 2017 These help a great deal with lessening her foot and knee pain She continues having a lot of problems with her left knee pain and may require a new knee replacement revision Without the orthotics she has trouble walking She also gets pain along the medial ankle and foot and less she is wearing orthotics She comes today because her old orthotics are feeling less cushioned and she is hoping to get a new pair  Review of systems Pain along the medial knee and sometimes a feeling of instability Swelling in the medial ankle and foot on the left by the end of the day  Physical examination Obese female in no acute distress BP 128/72   Ht 5\' 1"  (1.549 m)   Wt 251 lb (113.9 kg)   BMI 47.43 kg/m   On standing the patient has loss of longitudinal arch but good alignment on the right She has minimal pronation on the right Calcaneus is in a neutral position  On the left foot and ankle the patient has signs of subtalar subluxation Left foot is externally rotated and laterally displaced Pronation noted at the rear and midfoot 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

## 2017-12-13 NOTE — Assessment & Plan Note (Signed)
Patient was fitted for a new pair of custom orthotics today Gait assessment before orthotic showed significant pronation, calcaneal valgus and dynamic genu valgus Post completion of the orthotics we were able to demonstrate that we could control pronation and less than by about 50% of the degree of genu valgus  Patient was fitted for a : standard, cushioned, semi-rigid orthotic. The orthotic was heated and afterward the patient stood on the orthotic blank positioned on the orthotic stand. The patient was positioned in subtalar neutral position and 10 degrees of ankle dorsiflexion in a weight bearing stance. After completion of molding, a stable base was applied to the orthotic blank. The blank was ground to a stable position for weight bearing. Size: 7 red EVA Base: Blue medium density Posting: scaphoid on left Additional orthotic padding: medial heel wedge on left  I spent 40 minutes with this patient. Over 50% of visit was spend in counseling and coordination of care for problems with using orthotics to correct gait and stabilizing left knee position.

## 2017-12-15 NOTE — Progress Notes (Signed)
Office: [phone]  /  Fax: [phone]   HPI:   Chief Complaint: OBESITY Jessica Good is here to discuss her progress with her obesity treatment plan. She is on the Category 2 plan and is following her eating plan approximately 75 % of the time. She states she is walking 20 minutes 5 times per week. Jmya's life at home is still stressful. Her brother is living with her. 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 Her weight is 253 lb (114.8 kg) today and has had a weight gain of 2 pounds over a period of 3 weeks since her last visit. She has lost 10 lbs since starting treatment with [place].  Hypertension Jessica Good is a 67 y.o. female with hypertension. She stopped blood pressure medications two weeks ago. Jessica Good denies chest pain, chest pressure or headache. She is working weight loss to help control her blood pressure with the goal of decreasing her risk of heart attack and stroke. Vivians blood pressure is currently controlled.  Diabetes II Jessica Good has a diagnosis of diabetes type II. Telia admits to stress eating of carbohydrates. She has been having headaches.  Jessica Good denies any hypoglycemic episodes. Last A1c was at 7.1 She has been working on intensive lifestyle modifications including diet, exercise, and weight loss to help control her blood glucose levels.  ALLERGIES: Allergies  Allergen Reactions  . Demerol [Meperidine] Other (See Comments)    caused excessive sleepiness  . Latex Rash    Pt. Stated she has a " latex allergy" and it "gives her a rash".  . Sulfa Antibiotics Other (See Comments)    : Flu-like symptoms  . Adhesive [Tape] Rash    Reaction to bandaids    MEDICATIONS: Current Outpatient Medications on File Prior to Visit  Medication Sig Dispense Refill  . acetaminophen (TYLENOL) 500 MG tablet Take 500 mg by mouth every 6 (six) hours as needed for moderate pain.    Marland Kitchen alendronate (FOSAMAX) 70 MG tablet Take 70 mg by mouth once a week.  Take with a full glass of water on an empty stomach.    Marland Kitchen aspirin 81 MG tablet Take 81 mg by mouth daily.    . cholecalciferol (VITAMIN D) 1000 UNITS tablet Take 1,000 Units by mouth daily.    . furosemide (LASIX) 40 MG tablet Take 40 mg by mouth daily as needed for fluid.     Marland Kitchen ibuprofen (ADVIL,MOTRIN) 800 MG tablet Take 800 mg by mouth every 8 (eight) hours as needed.    . Multiple Vitamin (MULTIVITAMIN WITH MINERALS) TABS Take 1 tablet by mouth daily.    . sucralfate (CARAFATE) 1 g tablet Take 1 g by mouth 3 (three) times daily as needed (ulcer).     . thiamine (VITAMIN B-1) 100 MG tablet Take 100 mg by mouth daily.    . vitamin B-12 (CYANOCOBALAMIN) 500 MCG tablet Take 500 mcg by mouth daily.    . vitamin C (ASCORBIC ACID) 500 MG tablet Take 500 mg by mouth daily.    Marland Kitchen lisinopril (PRINIVIL,ZESTRIL) 20 MG tablet Take 1 tablet (20 mg total) by mouth 2 (two) times daily. (Patient not taking: Reported on 12/13/2017) 60 tablet 0   No current facility-administered medications on file prior to visit.     PAST MEDICAL HISTORY: Past Medical History:  Diagnosis Date  . Adenomatous colon polyp   . Anemia yrs ago   PMH  . Anxiety   . Cataract   . Constipation due  to pain medication   . Edema of lower extremity    Takes lasix PRN  . Gallstones   . Gastritis   . GERD (gastroesophageal reflux disease)    with meds  . Hiatal hernia   . History of recurrent UTIs   . Hyperlipidemia   . Hypertension   . IBS (irritable bowel syndrome)   . Interstitial cystitis   . Loose, teeth    in the front of mouth  . Osteoarthritis   . Osteoporosis    osteoarthritis  . Pre-diabetes   . Pseudogout   . Urgency of urination     PAST SURGICAL HISTORY: Past Surgical History:  Procedure Laterality Date  . ANKLE SURGERY Right   . BREAST LUMPECTOMY Left 1996   fibroid  . COLONOSCOPY W/ POLYPECTOMY    . COLONOSCOPY WITH PROPOFOL N/A 09/02/2016   Procedure: COLONOSCOPY WITH PROPOFOL;  Surgeon: Milus Banister, MD;  Location: [facility];  Service: Endoscopy;  Laterality: N/A;  . EUS N/A 02/06/2015   Procedure: UPPER ENDOSCOPIC ULTRASOUND (EUS) LINEAR;  Surgeon: Milus Banister, MD;  Location: [facility];  Service: Endoscopy;  Laterality: N/A;  . EUS N/A 09/02/2016   Procedure: UPPER ENDOSCOPIC ULTRASOUND (EUS) LINEAR;  Surgeon: Milus Banister, MD;  Location: [facility];  Service: Endoscopy;  Laterality: N/A;  . EYE SURGERY Bilateral    lasix 14 yrs ago  . JOINT REPLACEMENT    . PARTIAL KNEE ARTHROPLASTY Left 11/26/2014   Procedure: LEFT UNICOMPARTMENTAL KNEE;  Surgeon: Renette Butters, MD;  Location: [place];  Service: Orthopedics;  Laterality: Left;  . TOTAL KNEE ARTHROPLASTY Right 04/09/2014   DR MURPHY  . TOTAL KNEE ARTHROPLASTY Right 04/09/2014   Procedure: TOTAL KNEE ARTHROPLASTY;  Surgeon: Renette Butters, MD;  Location: [place];  Service: Orthopedics;  Laterality: Right;  . TOTAL KNEE REVISION Left 11/03/2015   Procedure: TOTAL KNEE REVISION;  Surgeon: Renette Butters, MD;  Location: [place];  Service: Orthopedics;  Laterality: Left;    SOCIAL HISTORY: Social History   Tobacco Use  . Smoking status: Never Smoker  . Smokeless tobacco: Never Used  Substance Use Topics  . Alcohol use: No    Alcohol/week: 0.0 standard drinks  . Drug use: No    FAMILY HISTORY: Family History  Problem Relation Age of Onset  . Pancreatic cancer Mother        head of pancreas removed   . Diabetes Mother   . Hypertension Mother   . Diabetes Father   . Heart disease Father   . Hypertension Father   . Diabetes Maternal Grandmother   . Breast cancer Neg Hx   . Celiac disease Neg Hx   . Cirrhosis Neg Hx   . Clotting disorder Neg Hx   . Colitis Neg Hx   . Colon cancer Neg Hx   . Colon polyps Neg Hx   . Crohn's disease Neg Hx   . Cystic fibrosis Neg Hx   . Esophageal cancer Neg Hx   . Hemochromatosis Neg Hx   . Inflammatory bowel disease Neg Hx   . Irritable bowel syndrome Neg Hx   .  Kidney disease Neg Hx   . Liver cancer Neg Hx   . Liver disease Neg Hx   . Ovarian cancer Neg Hx   . Prostate cancer Neg Hx   . Rectal cancer Neg Hx   . Stomach cancer Neg Hx   . Ulcerative colitis Neg Hx   . Uterine cancer  Neg Hx   . Wilson's disease Neg Hx   . Goiter Neg Hx     ROS: Review of Systems  Constitutional: Negative for weight loss.  Cardiovascular: Negative for chest pain.       Negative for chest pressure  Neurological: Positive for headaches.  Psychiatric/Behavioral:       Positive for stress    PHYSICAL EXAM: Blood pressure 138/72, pulse 76, temperature 98.2 F (36.8 C), temperature source Oral, height 5\' 2"  (1.575 m), weight 253 lb (114.8 kg), SpO2 94 %. Body mass index is 46.27 kg/m. Physical Exam  Constitutional: She is oriented to person, place, and time. She appears well-developed and well-nourished.  Cardiovascular: Normal rate.  Pulmonary/Chest: Effort normal.  Musculoskeletal: Normal range of motion.  Neurological: She is oriented to person, place, and time.  Skin: Skin is warm and dry.  Psychiatric: She has a normal mood and affect. Her behavior is normal.  Vitals reviewed.   RECENT LABS AND TESTS: BMET    Component Value Date/Time   NA 138 09/05/2017 0000   K 4.1 09/05/2017 0000   CL 101 09/05/2017 0000   CO2 24 09/05/2017 0000   GLUCOSE 139 (H) 09/05/2017 0000   GLUCOSE 156 (H) 05/04/2017 0034   BUN 9 09/05/2017 0000   CREATININE 0.64 09/05/2017 0000   CREATININE 0.65 01/22/2016 1130   CALCIUM 8.8 09/05/2017 0000   GFRNONAA 93 09/05/2017 0000   GFRAA 107 09/05/2017 0000   Lab Results  Component Value Date   HGBA1C 7.1 (H) 09/05/2017   HGBA1C 6.7 (H) 10/27/2015   HGBA1C 6.3 (H) 11/18/2014   Lab Results  Component Value Date   INSULIN 10.7 09/05/2017   CBC    Component Value Date/Time   WBC 4.0 09/05/2017 0000   WBC 6.0 05/04/2017 0034   RBC 4.49 09/05/2017 0000   RBC 4.46 05/04/2017 0034   HGB 13.7 09/05/2017 0000    HCT 41.8 09/05/2017 0000   PLT 210 05/04/2017 0034   MCV 93 09/05/2017 0000   MCH 30.5 09/05/2017 0000   MCH 30.0 05/04/2017 0034   MCHC 32.8 09/05/2017 0000   MCHC 32.8 05/04/2017 0034   RDW 13.2 09/05/2017 0000   LYMPHSABS 1.4 09/05/2017 0000   MONOABS 0.4 08/05/2016 1618   EOSABS 0.1 09/05/2017 0000   BASOSABS 0.0 09/05/2017 0000   Iron/TIBC/Ferritin/ %Sat No results found for: IRON, TIBC, FERRITIN, IRONPCTSAT Lipid Panel     Component Value Date/Time   CHOL 151 09/05/2017 0000   TRIG 103 09/05/2017 0000   HDL 44 09/05/2017 0000   LDLCALC 86 09/05/2017 0000   Hepatic Function Panel     Component Value Date/Time   PROT 6.2 09/05/2017 0000   ALBUMIN 3.9 09/05/2017 0000   AST 13 09/05/2017 0000   ALT 6 09/05/2017 0000   ALKPHOS 76 09/05/2017 0000   BILITOT 0.5 09/05/2017 0000      Component Value Date/Time   TSH 0.343 (L) 09/05/2017 0000   TSH 0.60 01/07/2017 0930   Results for ADISSON, DEAK (MRN [id number]) as of 12/15/2017 09:59  Ref. Range 09/05/2017 00:00  Vitamin D, 25-Hydroxy Latest Ref Range: 30.0 - 100.0 ng/mL 55.1   ASSESSMENT AND PLAN: Essential hypertension  Type 2 diabetes mellitus without complication, without long-term current use of insulin (HCC) - Plan: metFORMIN (GLUCOPHAGE) 500 MG tablet  Class 3 severe obesity with serious comorbidity and body mass index (BMI) of 45.0 to 49.9 in adult, unspecified obesity type ([place])  PLAN:  Hypertension We  discussed sodium restriction, working on healthy weight loss, and a regular exercise program as the means to achieve improved blood pressure control. Janki agreed with this plan and agreed to follow up as directed. We will follow up at the next appointment. We will continue to monitor her blood pressure as well as her progress with the above lifestyle modifications. She will watch for signs of hypotension as she continues her lifestyle modifications.  Diabetes II Ruthene has been given extensive diabetes  education by myself today including ideal fasting and post-prandial blood glucose readings, individual ideal Hgb A1c goals and hypoglycemia prevention. We discussed the importance of good blood sugar control to decrease the likelihood of diabetic complications such as nephropathy, neuropathy, limb loss, blindness, coronary artery disease, and death. We discussed the importance of intensive lifestyle modification including diet, exercise and weight loss as the first line treatment for diabetes. Fidencia agrees to continue metformin 500 mg qAM #30 with no refills. She can restart metformin after one week, if she does not notice improvement in symptoms. Clydette will follow up at the agreed upon time.  Obesity Rhylan is currently in the action stage of change. As such, her goal is to continue with weight loss efforts She has agreed to portion control better and make smarter food choices, such as increase vegetables and decrease simple carbohydrates  Nanna has been instructed to work up to a goal of 150 minutes of combined cardio and strengthening exercise per week for weight loss and overall health benefits. We discussed the following Behavioral Modification Strategies today: planning for success, increasing lean protein intake, increasing vegetables and work on meal planning and easy cooking plans  Justiss has agreed to follow up with our clinic in 4 to 5 weeks. She was informed of the importance of frequent follow up visits to maximize her success with intensive lifestyle modifications for her multiple health conditions.   OBESITY BEHAVIORAL INTERVENTION VISIT  Today's visit was # 6 out of 22.  Starting weight: 263 lbs Starting date: 09/05/17 Today's weight : 253 lbs  Today's date: 12/13/2017 Total lbs lost to date: 10    ASK: We discussed the diagnosis of obesity with [name] today and Deandria agreed to give [place] permission to discuss obesity behavioral modification therapy  today.  ASSESS: Matilynn has the diagnosis of obesity and her BMI today is 46.26 Nelle is in the action stage of change   ADVISE: Guenevere was educated on the multiple health risks of obesity as well as the benefit of weight loss to improve her health. She was advised of the need for long term treatment and the importance of lifestyle modifications.  AGREE: Multiple dietary modification options and treatment options were discussed and  Yesmin agreed to the above obesity treatment plan.  I, Doreene Nest, am acting as transcriptionist for Eber Jones, MD  I have reviewed the above documentation for accuracy and completeness, and I agree with the above. - Ilene Qua, MD

## 2017-12-24 DIAGNOSIS — R42 Dizziness and giddiness: Secondary | ICD-10-CM | POA: Diagnosis not present

## 2017-12-24 DIAGNOSIS — E0590 Thyrotoxicosis, unspecified without thyrotoxic crisis or storm: Secondary | ICD-10-CM | POA: Diagnosis not present

## 2017-12-24 DIAGNOSIS — E119 Type 2 diabetes mellitus without complications: Secondary | ICD-10-CM | POA: Diagnosis not present

## 2018-01-06 ENCOUNTER — Other Ambulatory Visit: Payer: Self-pay

## 2018-01-06 ENCOUNTER — Ambulatory Visit (INDEPENDENT_AMBULATORY_CARE_PROVIDER_SITE_OTHER): Payer: Medicare Other | Admitting: Endocrinology

## 2018-01-06 ENCOUNTER — Encounter: Payer: Self-pay | Admitting: Endocrinology

## 2018-01-06 VITALS — BP 156/100 | HR 66 | Temp 98.1°F | Ht 62.0 in | Wt 259.4 lb

## 2018-01-06 DIAGNOSIS — R7303 Prediabetes: Secondary | ICD-10-CM

## 2018-01-06 LAB — POCT GLYCOSYLATED HEMOGLOBIN (HGB A1C): HEMOGLOBIN A1C: 5.9 % — AB (ref 4.0–5.6)

## 2018-01-06 MED ORDER — TERIPARATIDE (RECOMBINANT) 600 MCG/2.4ML ~~LOC~~ SOLN: 20.0000 ug | Freq: Every day | SUBCUTANEOUS | 0 refills | Status: DC

## 2018-01-06 NOTE — Patient Instructions (Addendum)
You should consider having the radioactive iodine pill.  It works like this: We would check a thyroid "scan" (a special, but easy and painless type of thyroid x ray).  you go to the x-ray department of the hospital to swallow a pill, which contains a miniscule amount of radiation.  You will not notice any symptoms from this.  You will go back to the x-ray department the next day, to lie down in front of a camera.  The results of this will be sent to me.   Based on the results, i hope to order for you a treatment pill of radioactive iodine.  Although it is a larger amount of radiation, you will again notice no symptoms from this.  The pill is gone from your body in a few days (during which you should stay away from other people), but takes several months to work.  Therefore, please return here approximately 6-8 weeks after the treatment.  This treatment has been available for many years, and the only known side-effect is an underactive thyroid.  It is possible that i would eventually prescribe for you a thyroid hormone pill, which is very inexpensive.  You don't have to worry about side-effects of this thyroid hormone pill, because it is the same molecule your thyroid makes.   The other option would be a daily medication, to slow the thyroid.   Please think this over, and let us know.

## 2018-01-06 NOTE — Progress Notes (Signed)
Corrected med list/did not send in [place]/thx dmf

## 2018-01-06 NOTE — Progress Notes (Signed)
Subjective:    Patient ID: Jessica Good, female    DOB: 09-22-1950, 67 y.o.   MRN: [id number]  HPI Pt returns for f/u of large multinodular goiter, extending to the chest (dx'ed 2018; bx showed LMP (SPECIMEN 2 OF 2, COLLECTED 02/09/17): bxs showed CATEGORY 1 and 2).   She does not notice the goiter.   Past Medical History:  Diagnosis Date  . Adenomatous colon polyp   . Anemia yrs ago   PMH  . Anxiety   . Cataract   . Constipation due to pain medication   . Edema of lower extremity    Takes lasix PRN  . Gallstones   . Gastritis   . GERD (gastroesophageal reflux disease)    with meds  . Hiatal hernia   . History of recurrent UTIs   . Hyperlipidemia   . Hypertension   . IBS (irritable bowel syndrome)   . Interstitial cystitis   . Loose, teeth    in the front of mouth  . Osteoarthritis   . Osteoporosis    osteoarthritis  . Pre-diabetes   . Pseudogout   . Urgency of urination     Past Surgical History:  Procedure Laterality Date  . ANKLE SURGERY Right   . BREAST LUMPECTOMY Left 1996   fibroid  . COLONOSCOPY W/ POLYPECTOMY    . COLONOSCOPY WITH PROPOFOL N/A 09/02/2016   Procedure: COLONOSCOPY WITH PROPOFOL;  Surgeon: Milus Banister, MD;  Location: [facility];  Service: Endoscopy;  Laterality: N/A;  . EUS N/A 02/06/2015   Procedure: UPPER ENDOSCOPIC ULTRASOUND (EUS) LINEAR;  Surgeon: Milus Banister, MD;  Location[facility];  Service: Endoscopy;  Laterality: N/A;  . EUS N/A 09/02/2016   Procedure: UPPER ENDOSCOPIC ULTRASOUND (EUS) LINEAR;  Surgeon: Milus Banister, MD;  Location: [facility];  Service: Endoscopy;  Laterality: N/A;  . EYE SURGERY Bilateral    lasix 14 yrs ago  . JOINT REPLACEMENT    . PARTIAL KNEE ARTHROPLASTY Left 11/26/2014   Procedure: LEFT UNICOMPARTMENTAL KNEE;  Surgeon: Renette Butters, MD;  Location: [place];  Service: Orthopedics;  Laterality: Left;  . TOTAL KNEE ARTHROPLASTY Right 04/09/2014   DR MURPHY  . TOTAL KNEE ARTHROPLASTY Right  04/09/2014   Procedure: TOTAL KNEE ARTHROPLASTY;  Surgeon: Renette Butters, MD;  Location: [place];  Service: Orthopedics;  Laterality: Right;  . TOTAL KNEE REVISION Left 11/03/2015   Procedure: TOTAL KNEE REVISION;  Surgeon: Renette Butters, MD;  Location: [place];  Service: Orthopedics;  Laterality: Left;    Social History   Socioeconomic History  . Marital status: Married    Spouse name: Not on file  . Number of children: Not on file  . Years of education: Not on file  . Highest education level: Not on file  Occupational History  . Occupation: stay at home spouse  Social Needs  . Financial resource strain: Not on file  . Food insecurity:    Worry: Not on file    Inability: Not on file  . Transportation needs:    Medical: Not on file    Non-medical: Not on file  Tobacco Use  . Smoking status: Never Smoker  . Smokeless tobacco: Never Used  Substance and Sexual Activity  . Alcohol use: No    Alcohol/week: 0.0 standard drinks  . Drug use: No  . Sexual activity: Not on file  Lifestyle  . Physical activity:    Days per week: Not on file  Minutes per session: Not on file  . Stress: Not on file  Relationships  . Social connections:    Talks on phone: Not on file    Gets together: Not on file    Attends religious service: Not on file    Active member of club or organization: Not on file    Attends meetings of clubs or organizations: Not on file    Relationship status: Not on file  . Intimate partner violence:    Fear of current or ex partner: Not on file    Emotionally abused: Not on file    Physically abused: Not on file    Forced sexual activity: Not on file  Other Topics Concern  . Not on file  Social History Narrative  . Not on file    Current Outpatient Medications on File Prior to Visit  Medication Sig Dispense Refill  . acetaminophen (TYLENOL) 500 MG tablet Take 500 mg by mouth every 6 (six) hours as needed for moderate pain.    Marland Kitchen aspirin 81 MG tablet Take 81  mg by mouth daily.    . cholecalciferol (VITAMIN D) 1000 UNITS tablet Take 1,000 Units by mouth daily.    . furosemide (LASIX) 40 MG tablet Take 40 mg by mouth daily as needed for fluid.     Marland Kitchen ibuprofen (ADVIL,MOTRIN) 800 MG tablet Take 800 mg by mouth every 8 (eight) hours as needed.    Marland Kitchen lisinopril (PRINIVIL,ZESTRIL) 20 MG tablet Take 1 tablet (20 mg total) by mouth 2 (two) times daily. (Patient not taking: Reported on 12/13/2017) 60 tablet 0  . Multiple Vitamin (MULTIVITAMIN WITH MINERALS) TABS Take 1 tablet by mouth daily.    . sucralfate (CARAFATE) 1 g tablet Take 1 g by mouth 3 (three) times daily as needed (ulcer).     . thiamine (VITAMIN B-1) 100 MG tablet Take 100 mg by mouth daily.    . vitamin B-12 (CYANOCOBALAMIN) 500 MCG tablet Take 500 mcg by mouth daily.    . vitamin C (ASCORBIC ACID) 500 MG tablet Take 500 mg by mouth daily.     No current facility-administered medications on file prior to visit.     Allergies  Allergen Reactions  . Demerol [Meperidine] Other (See Comments)    caused excessive sleepiness  . Latex Rash    Pt. Stated she has a " latex allergy" and it "gives her a rash".  . Sulfa Antibiotics Other (See Comments)    : Flu-like symptoms  . Adhesive [Tape] Rash    Reaction to bandaids    Family History  Problem Relation Age of Onset  . Pancreatic cancer Mother        head of pancreas removed   . Diabetes Mother   . Hypertension Mother   . Diabetes Father   . Heart disease Father   . Hypertension Father   . Diabetes Maternal Grandmother   . Breast cancer Neg Hx   . Celiac disease Neg Hx   . Cirrhosis Neg Hx   . Clotting disorder Neg Hx   . Colitis Neg Hx   . Colon cancer Neg Hx   . Colon polyps Neg Hx   . Crohn's disease Neg Hx   . Cystic fibrosis Neg Hx   . Esophageal cancer Neg Hx   . Hemochromatosis Neg Hx   . Inflammatory bowel disease Neg Hx   . Irritable bowel syndrome Neg Hx   . Kidney disease Neg Hx   . Liver cancer Neg  Hx   . Liver  disease Neg Hx   . Ovarian cancer Neg Hx   . Prostate cancer Neg Hx   . Rectal cancer Neg Hx   . Stomach cancer Neg Hx   . Ulcerative colitis Neg Hx   . Uterine cancer Neg Hx   . Wilson's disease Neg Hx   . Goiter Neg Hx     BP (!) 156/100 (BP Location: Left Arm, Patient Position: Sitting, Cuff Size: Large)   Pulse 66   Temp 98.1 F (36.7 C) (Oral)   Ht 5\' 2"  (1.575 m)   Wt 259 lb 6.4 oz (117.7 kg)   SpO2 98%   BMI 47.44 kg/m   Review of Systems Denies neck pain    Objective:   Physical Exam VITAL SIGNS:  See vs page.  GENERAL: no distress.  NECK: thyroid is slightly enlarged (L>R), with irreg surface.     Lab Results  Component Value Date   TSH 0.343 (L) 09/05/2017   T3TOTAL 130 09/05/2017       Assessment & Plan:  Multinodular goiter Hyperthyroidism, new.  Due to the goiter Osteoporosis: I told pt that in this context, she needs normalization of TSH  Patient Instructions  You should consider having the radioactive iodine pill.  It works like this: We would check a thyroid "scan" (a special, but easy and painless type of thyroid x ray).  you go to the x-ray department of the hospital to swallow a pill, which contains a miniscule amount of radiation.  You will not notice any symptoms from this.  You will go back to the x-ray department the next day, to lie down in front of a camera.  The results of this will be sent to me.   Based on the results, i hope to order for you a treatment pill of radioactive iodine.  Although it is a larger amount of radiation, you will again notice no symptoms from this.  The pill is gone from your body in a few days (during which you should stay away from other people), but takes several months to work.  Therefore, please return here approximately 6-8 weeks after the treatment.  This treatment has been available for many years, and the only known side-effect is an underactive thyroid.  It is possible that i would eventually prescribe for you  a thyroid hormone pill, which is very inexpensive.  You don't have to worry about side-effects of this thyroid hormone pill, because it is the same molecule your thyroid makes.   The other option would be a daily medication, to slow the thyroid.   Please think this over, and let us know.

## 2018-01-16 ENCOUNTER — Ambulatory Visit (INDEPENDENT_AMBULATORY_CARE_PROVIDER_SITE_OTHER): Payer: Medicare Other | Admitting: Family Medicine

## 2018-01-17 ENCOUNTER — Telehealth: Payer: Self-pay

## 2018-01-17 DIAGNOSIS — E0590 Thyrotoxicosis, unspecified without thyrotoxic crisis or storm: Secondary | ICD-10-CM | POA: Insufficient documentation

## 2018-01-17 NOTE — Telephone Encounter (Signed)
Pt called and stated that she had changed her mind about the radiation pill. She wanted to know if that would be called in or if she needed an appt, please advise

## 2018-01-17 NOTE — Telephone Encounter (Signed)
Spoke to pt and gave her this info

## 2018-01-17 NOTE — Telephone Encounter (Signed)
Ok, I ordered.  you will receive a phone call, about a day and time for an appointment

## 2018-01-26 ENCOUNTER — Ambulatory Visit (INDEPENDENT_AMBULATORY_CARE_PROVIDER_SITE_OTHER): Payer: Medicare Other | Admitting: Family Medicine

## 2018-01-26 VITALS — BP 145/90 | HR 70 | Temp 97.6°F | Ht 62.0 in | Wt 253.0 lb

## 2018-01-26 DIAGNOSIS — F419 Anxiety disorder, unspecified: Secondary | ICD-10-CM

## 2018-01-26 DIAGNOSIS — I10 Essential (primary) hypertension: Secondary | ICD-10-CM | POA: Diagnosis not present

## 2018-01-26 DIAGNOSIS — Z6841 Body Mass Index (BMI) 40.0 and over, adult: Secondary | ICD-10-CM | POA: Diagnosis not present

## 2018-01-30 NOTE — Progress Notes (Signed)
Office: [phone]  /  Fax: [phone]   HPI:   Chief Complaint: OBESITY Jessica Good is here to discuss her progress with her obesity treatment plan. She is on the Category 2 plan and is following her eating plan approximately 30 % of the time. She states she is walking 15 minutes 5 times per week. 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 Her sister is in palliative care and she is feeling overwhelmed. She started on anxiety medicine.  Her weight is 253 lb (114.8 kg) today and has not lost weight since her last visit. She has lost 10 lbs since starting treatment with [place].  Hypertension Jessica Good is a 67 y.o. female with hypertension. Jessica Good denies chest pain, chest pressure, or headaches.  She is working on weight loss to help control her blood pressure with the goal of decreasing her risk of heart attack and stroke. Jessica Good's blood pressure is currently controlled today.  Anxiety Jessica Good just started on Celexa and is cutting the pill in half. She just started the medication recently and is unclear if it is helping.  ALLERGIES: Allergies  Allergen Reactions  . Demerol [Meperidine] Other (See Comments)    caused excessive sleepiness  . Latex Rash    Pt. Stated she has a " latex allergy" and it "gives her a rash".  . Sulfa Antibiotics Other (See Comments)    : Flu-like symptoms  . Adhesive [Tape] Rash    Reaction to bandaids    MEDICATIONS: Current Outpatient Medications on File Prior to Visit  Medication Sig Dispense Refill  . acetaminophen (TYLENOL) 500 MG tablet Take 500 mg by mouth every 6 (six) hours as needed for moderate pain.    Marland Kitchen aspirin 81 MG tablet Take 81 mg by mouth daily.    . baclofen (LIORESAL) 10 MG tablet Take 1 tablet by mouth daily.    . cholecalciferol (VITAMIN D) 1000 UNITS tablet Take 1,000 Units by mouth daily.    . citalopram (CELEXA) 10 MG tablet Take 10 mg by mouth daily.    . furosemide (LASIX) 40 MG tablet  Take 40 mg by mouth daily as needed for fluid.     Marland Kitchen ibuprofen (ADVIL,MOTRIN) 800 MG tablet Take 800 mg by mouth every 8 (eight) hours as needed.    Marland Kitchen lisinopril (PRINIVIL,ZESTRIL) 20 MG tablet Take 1 tablet (20 mg total) by mouth 2 (two) times daily. 60 tablet 0  . Multiple Vitamin (MULTIVITAMIN WITH MINERALS) TABS Take 1 tablet by mouth daily.    . sucralfate (CARAFATE) 1 g tablet Take 1 g by mouth 3 (three) times daily as needed (ulcer).     . Teriparatide, Recombinant, (FORTEO) 600 MCG/2.4ML SOLN Inject 0.08 mLs (20 mcg total) into the skin daily. 2.24 mL 0  . thiamine (VITAMIN B-1) 100 MG tablet Take 100 mg by mouth daily.    . vitamin B-12 (CYANOCOBALAMIN) 500 MCG tablet Take 500 mcg by mouth daily.    . vitamin C (ASCORBIC ACID) 500 MG tablet Take 500 mg by mouth daily.     No current facility-administered medications on file prior to visit.     PAST MEDICAL HISTORY: Past Medical History:  Diagnosis Date  . Adenomatous colon polyp   . Anemia yrs ago   PMH  . Anxiety   . Cataract   . Constipation due to pain medication   . Edema of lower extremity    Takes lasix PRN  . Gallstones   .  Gastritis   . GERD (gastroesophageal reflux disease)    with meds  . Hiatal hernia   . History of recurrent UTIs   . Hyperlipidemia   . Hypertension   . IBS (irritable bowel syndrome)   . Interstitial cystitis   . Loose, teeth    in the front of mouth  . Osteoarthritis   . Osteoporosis    osteoarthritis  . Pre-diabetes   . Pseudogout   . Urgency of urination     PAST SURGICAL HISTORY: Past Surgical History:  Procedure Laterality Date  . ANKLE SURGERY Right   . BREAST LUMPECTOMY Left 1996   fibroid  . COLONOSCOPY W/ POLYPECTOMY    . COLONOSCOPY WITH PROPOFOL N/A 09/02/2016   Procedure: COLONOSCOPY WITH PROPOFOL;  Surgeon: Milus Banister, MD;  Location: [facility];  Service: Endoscopy;  Laterality: N/A;  . EUS N/A 02/06/2015   Procedure: UPPER ENDOSCOPIC ULTRASOUND (EUS) LINEAR;   Surgeon: Milus Banister, MD;  Location: [facility];  Service: Endoscopy;  Laterality: N/A;  . EUS N/A 09/02/2016   Procedure: UPPER ENDOSCOPIC ULTRASOUND (EUS) LINEAR;  Surgeon: Milus Banister, MD;  Location: [facility];  Service: Endoscopy;  Laterality: N/A;  . EYE SURGERY Bilateral    lasix 14 yrs ago  . JOINT REPLACEMENT    . PARTIAL KNEE ARTHROPLASTY Left 11/26/2014   Procedure: LEFT UNICOMPARTMENTAL KNEE;  Surgeon: Renette Butters, MD;  Location: [place];  Service: Orthopedics;  Laterality: Left;  . TOTAL KNEE ARTHROPLASTY Right 04/09/2014   DR MURPHY  . TOTAL KNEE ARTHROPLASTY Right 04/09/2014   Procedure: TOTAL KNEE ARTHROPLASTY;  Surgeon: Renette Butters, MD;  Location: [place];  Service: Orthopedics;  Laterality: Right;  . TOTAL KNEE REVISION Left 11/03/2015   Procedure: TOTAL KNEE REVISION;  Surgeon: Renette Butters, MD;  Location: [place];  Service: Orthopedics;  Laterality: Left;    SOCIAL HISTORY: Social History   Tobacco Use  . Smoking status: Never Smoker  . Smokeless tobacco: Never Used  Substance Use Topics  . Alcohol use: No    Alcohol/week: 0.0 standard drinks  . Drug use: No    FAMILY HISTORY: Family History  Problem Relation Age of Onset  . Pancreatic cancer Mother        head of pancreas removed   . Diabetes Mother   . Hypertension Mother   . Diabetes Father   . Heart disease Father   . Hypertension Father   . Diabetes Maternal Grandmother   . Breast cancer Neg Hx   . Celiac disease Neg Hx   . Cirrhosis Neg Hx   . Clotting disorder Neg Hx   . Colitis Neg Hx   . Colon cancer Neg Hx   . Colon polyps Neg Hx   . Crohn's disease Neg Hx   . Cystic fibrosis Neg Hx   . Esophageal cancer Neg Hx   . Hemochromatosis Neg Hx   . Inflammatory bowel disease Neg Hx   . Irritable bowel syndrome Neg Hx   . Kidney disease Neg Hx   . Liver cancer Neg Hx   . Liver disease Neg Hx   . Ovarian cancer Neg Hx   . Prostate cancer Neg Hx   . Rectal cancer Neg Hx     . Stomach cancer Neg Hx   . Ulcerative colitis Neg Hx   . Uterine cancer Neg Hx   . Wilson's disease Neg Hx   . Goiter Neg Hx     ROS: Review  of Systems  Constitutional: Negative for weight loss.  Cardiovascular: Negative for chest pain.       Negative for chest pressure.  Neurological: Negative for headaches.    PHYSICAL EXAM: Blood pressure (!) 145/90, pulse 70, temperature 97.6 F (36.4 C), temperature source Oral, height 5\' 2"  (1.575 m), weight 253 lb (114.8 kg), SpO2 97 %. Body mass index is 46.27 kg/m. Physical Exam  Constitutional: She is oriented to person, place, and time. She appears well-developed and well-nourished.  Cardiovascular: Normal rate.  Pulmonary/Chest: Effort normal.  Musculoskeletal: Normal range of motion.  Neurological: She is oriented to person, place, and time.  Skin: Skin is warm and dry.  Psychiatric: She has a normal mood and affect. Her behavior is normal.  Vitals reviewed.   RECENT LABS AND TESTS: BMET    Component Value Date/Time   NA 138 09/05/2017 0000   K 4.1 09/05/2017 0000   CL 101 09/05/2017 0000   CO2 24 09/05/2017 0000   GLUCOSE 139 (H) 09/05/2017 0000   GLUCOSE 156 (H) 05/04/2017 0034   BUN 9 09/05/2017 0000   CREATININE 0.64 09/05/2017 0000   CREATININE 0.65 01/22/2016 1130   CALCIUM 8.8 09/05/2017 0000   GFRNONAA 93 09/05/2017 0000   GFRAA 107 09/05/2017 0000   Lab Results  Component Value Date   HGBA1C 5.9 (A) 01/06/2018   HGBA1C 7.1 (H) 09/05/2017   HGBA1C 6.7 (H) 10/27/2015   HGBA1C 6.3 (H) 11/18/2014   Lab Results  Component Value Date   INSULIN 10.7 09/05/2017   CBC    Component Value Date/Time   WBC 4.0 09/05/2017 0000   WBC 6.0 05/04/2017 0034   RBC 4.49 09/05/2017 0000   RBC 4.46 05/04/2017 0034   HGB 13.7 09/05/2017 0000   HCT 41.8 09/05/2017 0000   PLT 210 05/04/2017 0034   MCV 93 09/05/2017 0000   MCH 30.5 09/05/2017 0000   MCH 30.0 05/04/2017 0034   MCHC 32.8 09/05/2017 0000   MCHC 32.8  05/04/2017 0034   RDW 13.2 09/05/2017 0000   LYMPHSABS 1.4 09/05/2017 0000   MONOABS 0.4 08/05/2016 1618   EOSABS 0.1 09/05/2017 0000   BASOSABS 0.0 09/05/2017 0000   Iron/TIBC/Ferritin/ %Sat No results found for: IRON, TIBC, FERRITIN, IRONPCTSAT Lipid Panel     Component Value Date/Time   CHOL 151 09/05/2017 0000   TRIG 103 09/05/2017 0000   HDL 44 09/05/2017 0000   LDLCALC 86 09/05/2017 0000   Hepatic Function Panel     Component Value Date/Time   PROT 6.2 09/05/2017 0000   ALBUMIN 3.9 09/05/2017 0000   AST 13 09/05/2017 0000   ALT 6 09/05/2017 0000   ALKPHOS 76 09/05/2017 0000   BILITOT 0.5 09/05/2017 0000      Component Value Date/Time   TSH 0.343 (L) 09/05/2017 0000   TSH 0.60 01/07/2017 0930   Results for AKIYAH, EPPOLITO (MRN [id number]) as of 01/30/2018 14:27  Ref. Range 09/05/2017 00:00  Vitamin D, 25-Hydroxy Latest Ref Range: 30.0 - 100.0 ng/mL 55.1   ASSESSMENT AND PLAN: Essential hypertension  Anxiety  Class 3 severe obesity with serious comorbidity and body mass index (BMI) of 45.0 to 49.9 in adult, unspecified obesity type (HCC)  PLAN:  Hypertension We discussed sodium restriction, working on healthy weight loss, and a regular exercise program as the means to achieve improved blood pressure control. Jessica Good agreed with this plan and agreed to follow up as directed. We will continue to monitor her blood pressure as well  as her progress with the above lifestyle modifications. She will continue her medications as prescribed and will watch for signs of hypotension as she continues her lifestyle modifications. She agrees to follow up at next appointment in 4 weeks.  Anxiety She will follow up with her prescribing provider, Jessica Good.  Obesity Jessica Good is currently in the action stage of change. As such, her goal is to continue with weight loss efforts. She has agreed to follow the Category 2 plan or the portion control and smarter choices  plan. Jessica Good has been instructed to work up to a goal of 150 minutes of combined cardio and strengthening exercise per week for weight loss and overall health benefits. We discussed the following Behavioral Modification Strategies today: decrease eating out, emotional eating strategies, and avoiding temptations.  Jessica Good has agreed to follow up with our clinic in 4 weeks. She was informed of the importance of frequent follow up visits to maximize her success with intensive lifestyle modifications for her multiple health conditions.  I spent > than 50% of the 15 minute visit on counseling as documented in the note.   OBESITY BEHAVIORAL INTERVENTION VISIT  Today's visit was # 7  Starting weight: 263 lbs Starting date: 09/05/17 Today's weight : Weight: 253 lb (114.8 kg)  Today's date: 01/26/2018 Total lbs lost to date: 10  ASK: We discussed the diagnosis of obesity with Jessica Good today and Jessica Good agreed to give [place] permission to discuss obesity behavioral modification therapy today.  ASSESS: Jessica Good has the diagnosis of obesity and her BMI today is 46.26. Jessica Good is in the action stage of change.   ADVISE: Jessica Good was educated on the multiple health risks of obesity as well as the benefit of weight loss to improve her health. She was advised of the need for long term treatment and the importance of lifestyle modifications to improve her current health and to decrease her risk of future health problems.  AGREE: Multiple dietary modification options and treatment options were discussed and [place] agreed to follow the recommendations documented in the above note.  ARRANGE: Marticia was educated on the importance of frequent visits to treat obesity as outlined per CMS and USPSTF guidelines and agreed to schedule her next follow up appointment today.  I, Marcille [place], am acting as Location manager for Eber Jones, MD  I have reviewed the above documentation for accuracy and  completeness, and I agree with the above. - Ilene Qua, MD

## 2018-01-31 DIAGNOSIS — M25562 Pain in left knee: Secondary | ICD-10-CM | POA: Diagnosis not present

## 2018-02-03 ENCOUNTER — Telehealth: Payer: Self-pay | Admitting: Endocrinology

## 2018-02-03 NOTE — Telephone Encounter (Signed)
Pt calling and stating she is waiting on call back to discuss when she need to come and get her radioactive pill. Please call pt

## 2018-02-08 NOTE — Telephone Encounter (Signed)
lft vm for pt with info

## 2018-02-08 NOTE — Telephone Encounter (Signed)
For the test pill, there is no restriction.  For the treatment pill afterwards, you should be away from other people for 3 days

## 2018-02-08 NOTE — Telephone Encounter (Signed)
Spoke to pt and she had a few questions as to when she would take the pill, how long she has to be away from her family etc, please advise

## 2018-02-16 DIAGNOSIS — D229 Melanocytic nevi, unspecified: Secondary | ICD-10-CM | POA: Diagnosis not present

## 2018-02-16 DIAGNOSIS — L821 Other seborrheic keratosis: Secondary | ICD-10-CM | POA: Diagnosis not present

## 2018-02-16 DIAGNOSIS — L820 Inflamed seborrheic keratosis: Secondary | ICD-10-CM | POA: Diagnosis not present

## 2018-02-21 ENCOUNTER — Encounter (INDEPENDENT_AMBULATORY_CARE_PROVIDER_SITE_OTHER): Payer: Self-pay

## 2018-02-21 ENCOUNTER — Ambulatory Visit (INDEPENDENT_AMBULATORY_CARE_PROVIDER_SITE_OTHER): Payer: Medicare Other | Admitting: Family Medicine

## 2018-02-23 ENCOUNTER — Encounter (HOSPITAL_COMMUNITY)
Admission: RE | Admit: 2018-02-23 | Discharge: 2018-02-23 | Disposition: A | Discharge status: Home / Self Care | Payer: Medicare Other | Source: Ambulatory Visit | Attending: Endocrinology | Admitting: Endocrinology

## 2018-02-23 DIAGNOSIS — E0590 Thyrotoxicosis, unspecified without thyrotoxic crisis or storm: Secondary | ICD-10-CM | POA: Insufficient documentation

## 2018-02-23 MED ORDER — SODIUM IODIDE I-123 7.4 MBQ CAPS
446.0000 | ORAL_CAPSULE | Freq: Once | ORAL | Status: AC
  Administered 2018-02-23: 446 via ORAL

## 2018-02-24 ENCOUNTER — Encounter (HOSPITAL_COMMUNITY)
Admission: RE | Admit: 2018-02-24 | Discharge: 2018-02-24 | Disposition: A | Discharge status: Home / Self Care | Payer: Medicare Other | Source: Ambulatory Visit | Attending: Endocrinology | Admitting: Endocrinology

## 2018-02-24 DIAGNOSIS — E042 Nontoxic multinodular goiter: Secondary | ICD-10-CM | POA: Diagnosis not present

## 2018-02-26 ENCOUNTER — Other Ambulatory Visit: Payer: Self-pay | Admitting: Endocrinology

## 2018-02-26 DIAGNOSIS — E0590 Thyrotoxicosis, unspecified without thyrotoxic crisis or storm: Secondary | ICD-10-CM

## 2018-03-09 ENCOUNTER — Ambulatory Visit (INDEPENDENT_AMBULATORY_CARE_PROVIDER_SITE_OTHER): Payer: Medicare Other | Admitting: Family Medicine

## 2018-03-09 VITALS — BP 158/83 | HR 76 | Temp 98.8°F | Ht 62.0 in | Wt 257.0 lb

## 2018-03-09 DIAGNOSIS — E0590 Thyrotoxicosis, unspecified without thyrotoxic crisis or storm: Secondary | ICD-10-CM

## 2018-03-09 DIAGNOSIS — Z6841 Body Mass Index (BMI) 40.0 and over, adult: Secondary | ICD-10-CM

## 2018-03-09 DIAGNOSIS — I10 Essential (primary) hypertension: Secondary | ICD-10-CM | POA: Diagnosis not present

## 2018-03-10 ENCOUNTER — Encounter (HOSPITAL_COMMUNITY)
Admission: RE | Admit: 2018-03-10 | Discharge: 2018-03-10 | Disposition: A | Discharge status: Home / Self Care | Payer: Medicare Other | Source: Ambulatory Visit | Attending: Endocrinology | Admitting: Endocrinology

## 2018-03-10 DIAGNOSIS — E0520 Thyrotoxicosis with toxic multinodular goiter without thyrotoxic crisis or storm: Secondary | ICD-10-CM | POA: Diagnosis not present

## 2018-03-10 DIAGNOSIS — E0590 Thyrotoxicosis, unspecified without thyrotoxic crisis or storm: Secondary | ICD-10-CM | POA: Diagnosis not present

## 2018-03-10 MED ORDER — SODIUM IODIDE I 131 CAPSULE
45.5000 | Freq: Once | INTRAVENOUS | Status: AC | PRN
  Administered 2018-03-10: 45.5 via ORAL

## 2018-03-13 NOTE — Progress Notes (Signed)
Office: [phone]  /  Fax: [phone]   HPI:   Chief Complaint: OBESITY Jessica Good is here to discuss her progress with her obesity treatment plan. She is on the Category 2 plan and is following her eating plan approximately 30 % of the time. She states she is exercising 0 minutes 0 times per week. Jessica Good has a lot of stress, and has tried to cook at home more but not choosing to make most vegetables. She has not chosen well when eating out.  Her weight is 257 lb (116.6 kg) today and has gained 4 pounds since her last visit. She has lost 6 lbs since starting treatment with [place].  Hypertension Jessica Good is a 67 y.o. female with hypertension. Jessica Good's blood pressure is elevated today, she is very stressed. She notes blood pressure is controlled at home. She denies chest pain, chest pressure, or headaches. She is working weight loss to help control her blood pressure with the goal of decreasing her risk of heart attack and stroke. Jessica Good's blood pressure is not currently controlled.  Hyperthyroidism Jessica Good has a diagnosis of hypothyroidism. She plans to take radioactive pill tomorrow. She was told to be away from people for 3 to 4 weeks. She denies hot or cold intolerance or palpitations.  ALLERGIES: Allergies  Allergen Reactions  . Demerol [Meperidine] Other (See Comments)    caused excessive sleepiness  . Latex Rash    Pt. Stated she has a " latex allergy" and it "gives her a rash".  . Sulfa Antibiotics Other (See Comments)    : Flu-like symptoms  . Adhesive [Tape] Rash    Reaction to bandaids    MEDICATIONS: Current Outpatient Medications on File Prior to Visit  Medication Sig Dispense Refill  . acetaminophen (TYLENOL) 500 MG tablet Take 500 mg by mouth every 6 (six) hours as needed for moderate pain.    Marland Kitchen aspirin 81 MG tablet Take 81 mg by mouth daily.    . baclofen (LIORESAL) 10 MG tablet Take 1 tablet by mouth daily.    . cholecalciferol (VITAMIN D) 1000 UNITS tablet  Take 1,000 Units by mouth daily.    . citalopram (CELEXA) 10 MG tablet Take 10 mg by mouth daily.    . furosemide (LASIX) 40 MG tablet Take 40 mg by mouth daily as needed for fluid.     Marland Kitchen ibuprofen (ADVIL,MOTRIN) 800 MG tablet Take 800 mg by mouth every 8 (eight) hours as needed.    Marland Kitchen lisinopril (PRINIVIL,ZESTRIL) 20 MG tablet Take 1 tablet (20 mg total) by mouth 2 (two) times daily. 60 tablet 0  . Multiple Vitamin (MULTIVITAMIN WITH MINERALS) TABS Take 1 tablet by mouth daily.    . sucralfate (CARAFATE) 1 g tablet Take 1 g by mouth 3 (three) times daily as needed (ulcer).     . Teriparatide, Recombinant, (FORTEO) 600 MCG/2.4ML SOLN Inject 0.08 mLs (20 mcg total) into the skin daily. 2.24 mL 0  . thiamine (VITAMIN B-1) 100 MG tablet Take 100 mg by mouth daily.    . vitamin B-12 (CYANOCOBALAMIN) 500 MCG tablet Take 500 mcg by mouth daily.    . vitamin C (ASCORBIC ACID) 500 MG tablet Take 500 mg by mouth daily.     No current facility-administered medications on file prior to visit.     PAST MEDICAL HISTORY: Past Medical History:  Diagnosis Date  . Adenomatous colon polyp   . Anemia yrs ago   PMH  . Anxiety   . Cataract   .  Constipation due to pain medication   . Edema of lower extremity    Takes lasix PRN  . Gallstones   . Gastritis   . GERD (gastroesophageal reflux disease)    with meds  . Hiatal hernia   . History of recurrent UTIs   . Hyperlipidemia   . Hypertension   . IBS (irritable bowel syndrome)   . Interstitial cystitis   . Loose, teeth    in the front of mouth  . Osteoarthritis   . Osteoporosis    osteoarthritis  . Pre-diabetes   . Pseudogout   . Urgency of urination     PAST SURGICAL HISTORY: Past Surgical History:  Procedure Laterality Date  . ANKLE SURGERY Right   . BREAST LUMPECTOMY Left 1996   fibroid  . COLONOSCOPY W/ POLYPECTOMY    . COLONOSCOPY WITH PROPOFOL N/A 09/02/2016   Procedure: COLONOSCOPY WITH PROPOFOL;  Surgeon: Milus Banister, MD;   Location: [facility];  Service: Endoscopy;  Laterality: N/A;  . EUS N/A 02/06/2015   Procedure: UPPER ENDOSCOPIC ULTRASOUND (EUS) LINEAR;  Surgeon: Milus Banister, MD;  Location: [facility];  Service: Endoscopy;  Laterality: N/A;  . EUS N/A 09/02/2016   Procedure: UPPER ENDOSCOPIC ULTRASOUND (EUS) LINEAR;  Surgeon: Milus Banister, MD;  Location: [facility];  Service: Endoscopy;  Laterality: N/A;  . EYE SURGERY Bilateral    lasix 14 yrs ago  . JOINT REPLACEMENT    . PARTIAL KNEE ARTHROPLASTY Left 11/26/2014   Procedure: LEFT UNICOMPARTMENTAL KNEE;  Surgeon: Renette Butters, MD;  Location: [facility];  Service: Orthopedics;  Laterality: Left;  . TOTAL KNEE ARTHROPLASTY Right 04/09/2014   DR MURPHY  . TOTAL KNEE ARTHROPLASTY Right 04/09/2014   Procedure: TOTAL KNEE ARTHROPLASTY;  Surgeon: Renette Butters, MD;  Location: [facility];  Service: Orthopedics;  Laterality: Right;  . TOTAL KNEE REVISION Left 11/03/2015   Procedure: TOTAL KNEE REVISION;  Surgeon: Renette Butters, MD;  Location: [facility];  Service: Orthopedics;  Laterality: Left;    SOCIAL HISTORY: Social History   Tobacco Use  . Smoking status: Never Smoker  . Smokeless tobacco: Never Used  Substance Use Topics  . Alcohol use: No    Alcohol/week: 0.0 standard drinks  . Drug use: No    FAMILY HISTORY: Family History  Problem Relation Age of Onset  . Pancreatic cancer Mother        head of pancreas removed   . Diabetes Mother   . Hypertension Mother   . Diabetes Father   . Heart disease Father   . Hypertension Father   . Diabetes Maternal Grandmother   . Breast cancer Neg Hx   . Celiac disease Neg Hx   . Cirrhosis Neg Hx   . Clotting disorder Neg Hx   . Colitis Neg Hx   . Colon cancer Neg Hx   . Colon polyps Neg Hx   . Crohn's disease Neg Hx   . Cystic fibrosis Neg Hx   . Esophageal cancer Neg Hx   . Hemochromatosis Neg Hx   . Inflammatory bowel disease Neg Hx   . Irritable bowel syndrome Neg Hx   . Kidney disease  Neg Hx   . Liver cancer Neg Hx   . Liver disease Neg Hx   . Ovarian cancer Neg Hx   . Prostate cancer Neg Hx   . Rectal cancer Neg Hx   . Stomach cancer Neg Hx   . Ulcerative colitis Neg Hx   .  Uterine cancer Neg Hx   . Wilson's disease Neg Hx   . Goiter Neg Hx     ROS: Review of Systems  Constitutional: Negative for weight loss.  Cardiovascular: Negative for chest pain and palpitations.       Negative chest pressure  Neurological: Negative for headaches.  Endo/Heme/Allergies:       Negative hot/cold intolerance    PHYSICAL EXAM: Blood pressure (!) 158/83, pulse 76, temperature 98.8 F (37.1 C), temperature source Oral, height 5\' 2"  (1.575 m), weight 257 lb (116.6 kg), SpO2 95 %. Body mass index is 47.01 kg/m. Physical Exam  Constitutional: She is oriented to person, place, and time. She appears well-developed and well-nourished.  Cardiovascular: Normal rate.  Pulmonary/Chest: Effort normal.  Musculoskeletal: Normal range of motion.  Neurological: She is oriented to person, place, and time.  Skin: Skin is warm and dry.  Psychiatric: She has a normal mood and affect. Her behavior is normal.  Vitals reviewed.   RECENT LABS AND TESTS: BMET    Component Value Date/Time   NA 138 09/05/2017 0000   K 4.1 09/05/2017 0000   CL 101 09/05/2017 0000   CO2 24 09/05/2017 0000   GLUCOSE 139 (H) 09/05/2017 0000   GLUCOSE 156 (H) 05/04/2017 0034   BUN 9 09/05/2017 0000   CREATININE 0.64 09/05/2017 0000   CREATININE 0.65 01/22/2016 1130   CALCIUM 8.8 09/05/2017 0000   GFRNONAA 93 09/05/2017 0000   GFRAA 107 09/05/2017 0000   Lab Results  Component Value Date   HGBA1C 5.9 (A) 01/06/2018   HGBA1C 7.1 (H) 09/05/2017   HGBA1C 6.7 (H) 10/27/2015   HGBA1C 6.3 (H) 11/18/2014   Lab Results  Component Value Date   INSULIN 10.7 09/05/2017   CBC    Component Value Date/Time   WBC 4.0 09/05/2017 0000   WBC 6.0 05/04/2017 0034   RBC 4.49 09/05/2017 0000   RBC 4.46 05/04/2017  0034   HGB 13.7 09/05/2017 0000   HCT 41.8 09/05/2017 0000   PLT 210 05/04/2017 0034   MCV 93 09/05/2017 0000   MCH 30.5 09/05/2017 0000   MCH 30.0 05/04/2017 0034   MCHC 32.8 09/05/2017 0000   MCHC 32.8 05/04/2017 0034   RDW 13.2 09/05/2017 0000   LYMPHSABS 1.4 09/05/2017 0000   MONOABS 0.4 08/05/2016 1618   EOSABS 0.1 09/05/2017 0000   BASOSABS 0.0 09/05/2017 0000   Iron/TIBC/Ferritin/ %Sat No results found for: IRON, TIBC, FERRITIN, IRONPCTSAT Lipid Panel     Component Value Date/Time   CHOL 151 09/05/2017 0000   TRIG 103 09/05/2017 0000   HDL 44 09/05/2017 0000   LDLCALC 86 09/05/2017 0000   Hepatic Function Panel     Component Value Date/Time   PROT 6.2 09/05/2017 0000   ALBUMIN 3.9 09/05/2017 0000   AST 13 09/05/2017 0000   ALT 6 09/05/2017 0000   ALKPHOS 76 09/05/2017 0000   BILITOT 0.5 09/05/2017 0000      Component Value Date/Time   TSH 0.343 (L) 09/05/2017 0000   TSH 0.60 01/07/2017 0930    ASSESSMENT AND PLAN: Essential hypertension  Hyperthyroidism  Class 3 severe obesity with serious comorbidity and body mass index (BMI) of 45.0 to 49.9 in adult, unspecified obesity type ([place])  PLAN:  Hypertension We discussed sodium restriction, working on healthy weight loss, and a regular exercise program as the means to achieve improved blood pressure control. Jessica Good agreed with this plan and agreed to follow up as directed. We will continue to  monitor her blood pressure as well as her progress with the above lifestyle modifications. Jessica Good agrees to continue her medications and will watch for signs of hypotension as she continues her lifestyle modifications. Jessica Good agrees to follow up with our clinic in 2 weeks.  Hyperthyroidism Jessica Good was informed of the importance of good thyroid control to help with weight loss efforts. She was also informed that supertheraputic thyroid levels are dangerous and will not improve weight loss results. Jessica Good agrees to follow up  with Dr. Loanne Drilling at previously scheduled appointment, and she agrees to follow up with our clinic in 2 weeks.  I spent > than 50% of the 15 minute visit on counseling as documented in the note.  Obesity Jessica Good is currently in the action stage of change. As such, her goal is to continue with weight loss efforts She has agreed to follow the Category 2 plan Jessica Good has been instructed to work up to a goal of 150 minutes of combined cardio and strengthening exercise per week for weight loss and overall health benefits. We discussed the following Behavioral Modification Strategies today: increasing lean protein intake, increasing vegetables, work on meal planning and easy cooking plans, and planning for success   Jessica Good has agreed to follow up with our clinic in 2 weeks. She was informed of the importance of frequent follow up visits to maximize her success with intensive lifestyle modifications for her multiple health conditions.   OBESITY BEHAVIORAL INTERVENTION VISIT  Today's visit was # 8   Starting weight: 263 lbs Starting date: 09/05/17 Today's weight : 257 lbs Today's date: 03/09/2018 Total lbs lost to date: 6    ASK: We discussed the diagnosis of obesity with Jessica Good today and Jessica Good agreed to give [place] permission to discuss obesity behavioral modification therapy today.  ASSESS: Jessica Good has the diagnosis of obesity and her BMI today is 46.99 Jessica Good is in the action stage of change   ADVISE: Jessica Good was educated on the multiple health risks of obesity as well as the benefit of weight loss to improve her health. She was advised of the need for long term treatment and the importance of lifestyle modifications to improve her current health and to decrease her risk of future health problems.  AGREE: Multiple dietary modification options and treatment options were discussed and  Jessica Good agreed to follow the recommendations documented in the above note.  ARRANGE: Jessica Good was  educated on the importance of frequent visits to treat obesity as outlined per CMS and USPSTF guidelines and agreed to schedule her next follow up appointment today.  I, Trixie Dredge, am acting as transcriptionist for Ilene Qua, MD  I have reviewed the above documentation for accuracy and completeness, and I agree with the above. - Ilene Qua, MD

## 2018-03-23 ENCOUNTER — Ambulatory Visit (INDEPENDENT_AMBULATORY_CARE_PROVIDER_SITE_OTHER): Payer: Medicare Other | Admitting: Physician Assistant

## 2018-03-23 ENCOUNTER — Encounter (INDEPENDENT_AMBULATORY_CARE_PROVIDER_SITE_OTHER): Payer: Self-pay | Admitting: Physician Assistant

## 2018-03-23 VITALS — BP 172/81 | HR 74 | Temp 97.8°F | Ht 62.0 in | Wt 256.0 lb

## 2018-03-23 DIAGNOSIS — E559 Vitamin D deficiency, unspecified: Secondary | ICD-10-CM

## 2018-03-23 DIAGNOSIS — Z6841 Body Mass Index (BMI) 40.0 and over, adult: Secondary | ICD-10-CM | POA: Diagnosis not present

## 2018-03-23 DIAGNOSIS — I10 Essential (primary) hypertension: Secondary | ICD-10-CM

## 2018-03-23 DIAGNOSIS — E119 Type 2 diabetes mellitus without complications: Secondary | ICD-10-CM | POA: Diagnosis not present

## 2018-03-23 DIAGNOSIS — E7849 Other hyperlipidemia: Secondary | ICD-10-CM

## 2018-03-24 LAB — LIPID PANEL WITH LDL/HDL RATIO
CHOLESTEROL TOTAL: 182 mg/dL (ref 100–199)
HDL: 53 mg/dL (ref >39)
LDL Calculated: 104 mg/dL — ABNORMAL HIGH (ref 0–99)
LDl/HDL Ratio: 2 ratio (ref 0.0–3.2)
Triglycerides: 123 mg/dL (ref 0–149)
VLDL CHOLESTEROL CAL: 25 mg/dL (ref 5–40)

## 2018-03-24 LAB — COMPREHENSIVE METABOLIC PANEL
A/G RATIO: 1.9 (ref 1.2–2.2)
ALK PHOS: 98 IU/L (ref 39–117)
ALT: 8 IU/L (ref 0–32)
AST: 14 IU/L (ref 0–40)
Albumin: 4.2 g/dL (ref 3.6–4.8)
BILIRUBIN TOTAL: 0.5 mg/dL (ref 0.0–1.2)
BUN / CREAT RATIO: 15 (ref 12–28)
BUN: 10 mg/dL (ref 8–27)
CHLORIDE: 101 mmol/L (ref 96–106)
CO2: 24 mmol/L (ref 20–29)
Calcium: 8.9 mg/dL (ref 8.7–10.3)
Creatinine, Ser: 0.66 mg/dL (ref 0.57–1.00)
GFR calc non Af Amer: 91 mL/min/{1.73_m2} (ref >59)
GFR, EST AFRICAN AMERICAN: 105 mL/min/{1.73_m2} (ref >59)
Globulin, Total: 2.2 g/dL (ref 1.5–4.5)
Glucose: 133 mg/dL — ABNORMAL HIGH (ref 65–99)
POTASSIUM: 4 mmol/L (ref 3.5–5.2)
Sodium: 141 mmol/L (ref 134–144)
TOTAL PROTEIN: 6.4 g/dL (ref 6.0–8.5)

## 2018-03-24 LAB — HEMOGLOBIN A1C
Est. average glucose Bld gHb Est-mCnc: 131 mg/dL
HEMOGLOBIN A1C: 6.2 % — AB (ref 4.8–5.6)

## 2018-03-24 LAB — INSULIN, RANDOM: INSULIN: 8 u[IU]/mL (ref 2.6–24.9)

## 2018-03-24 LAB — VITAMIN D 25 HYDROXY (VIT D DEFICIENCY, FRACTURES): VIT D 25 HYDROXY: 46.6 ng/mL (ref 30.0–100.0)

## 2018-03-27 NOTE — Progress Notes (Signed)
Office: [phone]  /  Fax: [phone]   HPI:   Chief Complaint: OBESITY Jessica Good is here to discuss her progress with her obesity treatment plan. She is on the Category 2 plan and is following her eating plan approximately 20-30 % of the time. She states she is walking for 30 minutes 2 times per week. Jessica Good did well with weight loss. She reports struggling with the plan due to stress at home with her husband recently having a prostate biopsy and her sister being terminally ill.  Her weight is 256 lb (116.1 kg) today and has had a weight loss of 1 pound over a period of 2 weeks since her last visit. She has lost 7 lbs since starting treatment with [place].  Hypertension Jessica Good is a 67 y.o. female with hypertension. Jessica Good is on lisinopril and furosemide. She denies chest pain and her blood pressure is elevated today due to her husband's possibly being diagnosed with Cancer and sister terminally ill. She is working weight loss to help control her blood pressure with the goal of decreasing her risk of heart attack and stroke. Jessica Good's blood pressure is not currently controlled.  Diabetes II Jessica Good has a diagnosis of diabetes type II. Jessica Good is not on medications and she denies polyphagia or hypoglycemia. Last A1c was 6.2. She has been working on intensive lifestyle modifications including diet, exercise, and weight loss to help control her blood glucose levels.  Vitamin D Deficiency Jessica Good has a diagnosis of vitamin D deficiency. She is not on Vit D supplementation and last level at goal. She denies nausea, vomiting or muscle weakness.  Hyperlipidemia Jessica Good has hyperlipidemia and has been trying to improve her cholesterol levels with intensive lifestyle modification including a low saturated fat diet, exercise and weight loss. She is not on medications and denies any chest pain, claudication or myalgias.  ALLERGIES: Allergies  Allergen Reactions  . Demerol [Meperidine] Other (See  Comments)    caused excessive sleepiness  . Latex Rash    Pt. Stated she has a " latex allergy" and it "gives her a rash".  . Sulfa Antibiotics Other (See Comments)    : Flu-like symptoms  . Adhesive [Tape] Rash    Reaction to bandaids    MEDICATIONS: Current Outpatient Medications on File Prior to Visit  Medication Sig Dispense Refill  . acetaminophen (TYLENOL) 500 MG tablet Take 500 mg by mouth every 6 (six) hours as needed for moderate pain.    Marland Kitchen aspirin 81 MG tablet Take 81 mg by mouth daily.    . baclofen (LIORESAL) 10 MG tablet Take 1 tablet by mouth daily.    . cholecalciferol (VITAMIN D) 1000 UNITS tablet Take 1,000 Units by mouth daily.    . citalopram (CELEXA) 10 MG tablet Take 10 mg by mouth daily.    . furosemide (LASIX) 40 MG tablet Take 40 mg by mouth daily as needed for fluid.     Marland Kitchen ibuprofen (ADVIL,MOTRIN) 800 MG tablet Take 800 mg by mouth every 8 (eight) hours as needed.    Marland Kitchen lisinopril (PRINIVIL,ZESTRIL) 20 MG tablet Take 1 tablet (20 mg total) by mouth 2 (two) times daily. 60 tablet 0  . Multiple Vitamin (MULTIVITAMIN WITH MINERALS) TABS Take 1 tablet by mouth daily.    . sucralfate (CARAFATE) 1 g tablet Take 1 g by mouth 3 (three) times daily as needed (ulcer).     . Teriparatide, Recombinant, (FORTEO) 600 MCG/2.4ML SOLN Inject 0.08 mLs (20 mcg total) into the  skin daily. 2.24 mL 0  . thiamine (VITAMIN B-1) 100 MG tablet Take 100 mg by mouth daily.    . vitamin B-12 (CYANOCOBALAMIN) 500 MCG tablet Take 500 mcg by mouth daily.    . vitamin C (ASCORBIC ACID) 500 MG tablet Take 500 mg by mouth daily.     No current facility-administered medications on file prior to visit.     PAST MEDICAL HISTORY: Past Medical History:  Diagnosis Date  . Adenomatous colon polyp   . Anemia yrs ago   PMH  . Anxiety   . Cataract   . Constipation due to pain medication   . Edema of lower extremity    Takes lasix PRN  . Gallstones   . Gastritis   . GERD (gastroesophageal  reflux disease)    with meds  . Hiatal hernia   . History of recurrent UTIs   . Hyperlipidemia   . Hypertension   . IBS (irritable bowel syndrome)   . Interstitial cystitis   . Loose, teeth    in the front of mouth  . Osteoarthritis   . Osteoporosis    osteoarthritis  . Pre-diabetes   . Pseudogout   . Urgency of urination     PAST SURGICAL HISTORY: Past Surgical History:  Procedure Laterality Date  . ANKLE SURGERY Right   . BREAST LUMPECTOMY Left 1996   fibroid  . COLONOSCOPY W/ POLYPECTOMY    . COLONOSCOPY WITH PROPOFOL N/A 09/02/2016   Procedure: COLONOSCOPY WITH PROPOFOL;  Surgeon: Milus Banister, MD;  Location: [facility];  Service: Endoscopy;  Laterality: N/A;  . EUS N/A 02/06/2015   Procedure: UPPER ENDOSCOPIC ULTRASOUND (EUS) LINEAR;  Surgeon: Milus Banister, MD;  Location: [facility];  Service: Endoscopy;  Laterality: N/A;  . EUS N/A 09/02/2016   Procedure: UPPER ENDOSCOPIC ULTRASOUND (EUS) LINEAR;  Surgeon: Milus Banister, MD;  Location: [facility];  Service: Endoscopy;  Laterality: N/A;  . EYE SURGERY Bilateral    lasix 14 yrs ago  . JOINT REPLACEMENT    . PARTIAL KNEE ARTHROPLASTY Left 11/26/2014   Procedure: LEFT UNICOMPARTMENTAL KNEE;  Surgeon: Renette Butters, MD;  Location: [place];  Service: Orthopedics;  Laterality: Left;  . TOTAL KNEE ARTHROPLASTY Right 04/09/2014   DR MURPHY  . TOTAL KNEE ARTHROPLASTY Right 04/09/2014   Procedure: TOTAL KNEE ARTHROPLASTY;  Surgeon: Renette Butters, MD;  Location: [place];  Service: Orthopedics;  Laterality: Right;  . TOTAL KNEE REVISION Left 11/03/2015   Procedure: TOTAL KNEE REVISION;  Surgeon: Renette Butters, MD;  Location: [place];  Service: Orthopedics;  Laterality: Left;    SOCIAL HISTORY: Social History   Tobacco Use  . Smoking status: Never Smoker  . Smokeless tobacco: Never Used  Substance Use Topics  . Alcohol use: No    Alcohol/week: 0.0 standard drinks  . Drug use: No    FAMILY HISTORY: Family  History  Problem Relation Age of Onset  . Pancreatic cancer Mother        head of pancreas removed   . Diabetes Mother   . Hypertension Mother   . Diabetes Father   . Heart disease Father   . Hypertension Father   . Diabetes Maternal Grandmother   . Breast cancer Neg Hx   . Celiac disease Neg Hx   . Cirrhosis Neg Hx   . Clotting disorder Neg Hx   . Colitis Neg Hx   . Colon cancer Neg Hx   . Colon polyps Neg Hx   .  Crohn's disease Neg Hx   . Cystic fibrosis Neg Hx   . Esophageal cancer Neg Hx   . Hemochromatosis Neg Hx   . Inflammatory bowel disease Neg Hx   . Irritable bowel syndrome Neg Hx   . Kidney disease Neg Hx   . Liver cancer Neg Hx   . Liver disease Neg Hx   . Ovarian cancer Neg Hx   . Prostate cancer Neg Hx   . Rectal cancer Neg Hx   . Stomach cancer Neg Hx   . Ulcerative colitis Neg Hx   . Uterine cancer Neg Hx   . Wilson's disease Neg Hx   . Goiter Neg Hx     ROS: Review of Systems  Constitutional: Positive for weight loss.  Cardiovascular: Negative for chest pain and claudication.  Gastrointestinal: Negative for nausea and vomiting.  Musculoskeletal: Negative for myalgias.       Negative muscle weakness  Endo/Heme/Allergies:       Negative polyphagia Negative hypoglycemia    PHYSICAL EXAM: Blood pressure (!) 172/81, pulse 74, temperature 97.8 F (36.6 C), temperature source Oral, height 5\' 2"  (1.575 m), weight 256 lb (116.1 kg), SpO2 98 %. Body mass index is 46.82 kg/m. Physical Exam  Constitutional: She appears well-developed and well-nourished.  Cardiovascular: Normal rate.  Pulmonary/Chest: Effort normal.  Musculoskeletal: Normal range of motion.  Skin: Skin is warm and dry.  Psychiatric: She has a normal mood and affect. Her behavior is normal.  Vitals reviewed.   RECENT LABS AND TESTS: BMET    Component Value Date/Time   NA 141 03/23/2018 1049   K 4.0 03/23/2018 1049   CL 101 03/23/2018 1049   CO2 24 03/23/2018 1049   GLUCOSE 133  (H) 03/23/2018 1049   GLUCOSE 156 (H) 05/04/2017 0034   BUN 10 03/23/2018 1049   CREATININE 0.66 03/23/2018 1049   CREATININE 0.65 01/22/2016 1130   CALCIUM 8.9 03/23/2018 1049   GFRNONAA 91 03/23/2018 1049   GFRAA 105 03/23/2018 1049   Lab Results  Component Value Date   HGBA1C 6.2 (H) 03/23/2018   HGBA1C 5.9 (A) 01/06/2018   HGBA1C 7.1 (H) 09/05/2017   HGBA1C 6.7 (H) 10/27/2015   HGBA1C 6.3 (H) 11/18/2014   Lab Results  Component Value Date   INSULIN 8.0 03/23/2018   INSULIN 10.7 09/05/2017   CBC    Component Value Date/Time   WBC 4.0 09/05/2017 0000   WBC 6.0 05/04/2017 0034   RBC 4.49 09/05/2017 0000   RBC 4.46 05/04/2017 0034   HGB 13.7 09/05/2017 0000   HCT 41.8 09/05/2017 0000   PLT 210 05/04/2017 0034   MCV 93 09/05/2017 0000   MCH 30.5 09/05/2017 0000   MCH 30.0 05/04/2017 0034   MCHC 32.8 09/05/2017 0000   MCHC 32.8 05/04/2017 0034   RDW 13.2 09/05/2017 0000   LYMPHSABS 1.4 09/05/2017 0000   MONOABS 0.4 08/05/2016 1618   EOSABS 0.1 09/05/2017 0000   BASOSABS 0.0 09/05/2017 0000   Iron/TIBC/Ferritin/ %Sat No results found for: IRON, TIBC, FERRITIN, IRONPCTSAT Lipid Panel     Component Value Date/Time   CHOL 182 03/23/2018 1049   TRIG 123 03/23/2018 1049   HDL 53 03/23/2018 1049   LDLCALC 104 (H) 03/23/2018 1049   Hepatic Function Panel     Component Value Date/Time   PROT 6.4 03/23/2018 1049   ALBUMIN 4.2 03/23/2018 1049   AST 14 03/23/2018 1049   ALT 8 03/23/2018 1049   ALKPHOS 98 03/23/2018 1049  BILITOT 0.5 03/23/2018 1049      Component Value Date/Time   TSH 0.343 (L) 09/05/2017 0000   TSH 0.60 01/07/2017 0930  Results for ROMAYNE, TICAS (MRN [id number]) as of 03/27/2018 10:51  Ref. Range 09/05/2017 00:00  Vitamin D, 25-Hydroxy Latest Ref Range: 30.0 - 100.0 ng/mL 55.1    ASSESSMENT AND PLAN: Essential hypertension  Type 2 diabetes mellitus without complication, without long-term current use of insulin (HCC) - Plan:  Comprehensive metabolic panel, Hemoglobin A1c, Insulin, random  Vitamin D deficiency - Plan: VITAMIN D 25 Hydroxy (Vit-D Deficiency, Fractures)  Other hyperlipidemia - Plan: Lipid Panel With LDL/HDL Ratio  Class 3 severe obesity with serious comorbidity and body mass index (BMI) of 45.0 to 49.9 in adult, unspecified obesity type (HCC)  PLAN:  Hypertension We discussed sodium restriction, working on healthy weight loss, and a regular exercise program as the means to achieve improved blood pressure control. Jessica Good agreed with this plan and agreed to follow up as directed. We will continue to monitor her blood pressure as well as her progress with the above lifestyle modifications. Jessica Good agrees to continue her medications, weight loss, and will watch for signs of hypotension as she continues her lifestyle modifications. We will check labs today and Jessica Good agrees to follow up with our clinic in 2 weeks.  Diabetes II Jessica Good has been given extensive diabetes education by myself today including ideal fasting and post-prandial blood glucose readings, individual ideal Hgb A1c goals and hypoglycemia prevention. We discussed the importance of good blood sugar control to decrease the likelihood of diabetic complications such as nephropathy, neuropathy, limb loss, blindness, coronary artery disease, and death. We discussed the importance of intensive lifestyle modification including diet, exercise and weight loss as the first line treatment for diabetes. Jessica Good will continue with weight loss and we will check labs today. Jessica Good agrees to follow up with our clinic in 2 weeks.  Vitamin D Deficiency Jessica Good was informed that low vitamin D levels contributes to fatigue and are associated with obesity, breast, and colon cancer. She will follow up for routine testing of vitamin D, at least 2-3 times per year. She was informed of the risk of over-replacement of vitamin D and agrees to not increase her dose unless she  discusses this with [place] first. We will check labs today and Jessica Good agrees to follow up with our clinic in 2 weeks.  Hyperlipidemia Jessica Good was informed of the American Heart Association Guidelines emphasizing intensive lifestyle modifications as the first line treatment for hyperlipidemia. We discussed many lifestyle modifications today in depth, and Jessica Good will continue to work on decreasing saturated fats such as fatty red meat, butter and many fried foods. She will also increase vegetables and lean protein in her diet and continue to work on diet, exercise, and weight loss efforts. We will check labs today and Jessica Good agrees to follow up with our clinic in 2 weeks.  Obesity Jessica Good is currently in the action stage of change. As such, her goal is to continue with weight loss efforts She has agreed to follow the Category 2 plan Jessica Good has been instructed to work up to a goal of 150 minutes of combined cardio and strengthening exercise per week for weight loss and overall health benefits. We discussed the following Behavioral Modification Strategies today: decreasing simple carbohydrates, work on meal planning and easy cooking plans and better snacking choices   Jessica Good has agreed to follow up with our clinic in 2 weeks. She was informed of  the importance of frequent follow up visits to maximize her success with intensive lifestyle modifications for her multiple health conditions.   OBESITY BEHAVIORAL INTERVENTION VISIT  Today's visit was # 9  Starting weight: 263 lbs Starting date: 09/05/17 Today's weight : 256 lbs Today's date: 03/23/2018 Total lbs lost to date: 7 At least 15 minutes were spent on discussing the following behavioral intervention visit.   ASK: We discussed the diagnosis of obesity with Jessica Good today and Jessica Good agreed to give [place] permission to discuss obesity behavioral modification therapy today.  ASSESS: Jessica Good has the diagnosis of obesity and her BMI today is  46.81 Jessica Good is in the action stage of change   ADVISE: Jessica Good was educated on the multiple health risks of obesity as well as the benefit of weight loss to improve her health. She was advised of the need for long term treatment and the importance of lifestyle modifications.  AGREE: Multiple dietary modification options and treatment options were discussed and  Jessica Good agreed to the above obesity treatment plan.  Wilhemena Durie, am acting as transcriptionist for Abby Potash, PA-C I, Abby Potash, PA-C have reviewed above note and agree with its content

## 2018-03-28 DIAGNOSIS — M810 Age-related osteoporosis without current pathological fracture: Secondary | ICD-10-CM | POA: Diagnosis not present

## 2018-03-28 DIAGNOSIS — M545 Low back pain: Secondary | ICD-10-CM | POA: Diagnosis not present

## 2018-04-05 ENCOUNTER — Encounter (INDEPENDENT_AMBULATORY_CARE_PROVIDER_SITE_OTHER): Payer: Self-pay | Admitting: Physician Assistant

## 2018-04-05 ENCOUNTER — Ambulatory Visit (INDEPENDENT_AMBULATORY_CARE_PROVIDER_SITE_OTHER): Payer: Medicare Other | Admitting: Physician Assistant

## 2018-04-05 VITALS — BP 156/83 | HR 88 | Temp 97.9°F | Ht 62.0 in | Wt 252.0 lb

## 2018-04-05 DIAGNOSIS — Z6841 Body Mass Index (BMI) 40.0 and over, adult: Secondary | ICD-10-CM

## 2018-04-05 DIAGNOSIS — E559 Vitamin D deficiency, unspecified: Secondary | ICD-10-CM

## 2018-04-11 NOTE — Progress Notes (Signed)
Office: [phone]  /  Fax: [phone]   HPI:   Chief Complaint: OBESITY Jessica Good is here to discuss her progress with her obesity treatment plan. She is on the Category 2 plan and is following her eating plan approximately 70% of the time. She states she is exercising 30 minutes 4 times per week. Shavy did very well with weight loss. She reports eating more protein but she is sometimes skipping lunch. Her weight is 252 lb (114.3 kg) today and has had a weight loss of 4 pounds over a period of 2 weeks since her last visit. She has lost 11 lbs since starting treatment with [place].  Vitamin D deficiency Scott has a diagnosis of vitamin D deficiency. She is currently taking vit D 1000 units daily and is not at goal. She denies nausea, vomiting or muscle weakness.  ALLERGIES: Allergies  Allergen Reactions  . Demerol [Meperidine] Other (See Comments)    caused excessive sleepiness  . Latex Rash    Pt. Stated she has a " latex allergy" and it "gives her a rash".  . Sulfa Antibiotics Other (See Comments)    : Flu-like symptoms  . Adhesive [Tape] Rash    Reaction to bandaids    MEDICATIONS: Current Outpatient Medications on File Prior to Visit  Medication Sig Dispense Refill  . acetaminophen (TYLENOL) 500 MG tablet Take 500 mg by mouth every 6 (six) hours as needed for moderate pain.    Marland Kitchen aspirin 81 MG tablet Take 81 mg by mouth daily.    . baclofen (LIORESAL) 10 MG tablet Take 1 tablet by mouth daily.    . cholecalciferol (VITAMIN D) 1000 UNITS tablet Take 2,000 Units by mouth daily.     . citalopram (CELEXA) 10 MG tablet Take 10 mg by mouth daily.    . furosemide (LASIX) 40 MG tablet Take 40 mg by mouth daily as needed for fluid.     Marland Kitchen ibuprofen (ADVIL,MOTRIN) 800 MG tablet Take 800 mg by mouth every 8 (eight) hours as needed.    Marland Kitchen lisinopril (PRINIVIL,ZESTRIL) 20 MG tablet Take 1 tablet (20 mg total) by mouth 2 (two) times daily. 60 tablet 0  . Multiple Vitamin (MULTIVITAMIN WITH  MINERALS) TABS Take 1 tablet by mouth daily.    . sucralfate (CARAFATE) 1 g tablet Take 1 g by mouth 3 (three) times daily as needed (ulcer).     . Teriparatide, Recombinant, (FORTEO) 600 MCG/2.4ML SOLN Inject 0.08 mLs (20 mcg total) into the skin daily. 2.24 mL 0  . thiamine (VITAMIN B-1) 100 MG tablet Take 100 mg by mouth daily.    . vitamin B-12 (CYANOCOBALAMIN) 500 MCG tablet Take 500 mcg by mouth daily.    . vitamin C (ASCORBIC ACID) 500 MG tablet Take 500 mg by mouth daily.     No current facility-administered medications on file prior to visit.     PAST MEDICAL HISTORY: Past Medical History:  Diagnosis Date  . Adenomatous colon polyp   . Anemia yrs ago   PMH  . Anxiety   . Cataract   . Constipation due to pain medication   . Edema of lower extremity    Takes lasix PRN  . Gallstones   . Gastritis   . GERD (gastroesophageal reflux disease)    with meds  . Hiatal hernia   . History of recurrent UTIs   . Hyperlipidemia   . Hypertension   . IBS (irritable bowel syndrome)   . Interstitial cystitis   .  Loose, teeth    in the front of mouth  . Osteoarthritis   . Osteoporosis    osteoarthritis  . Pre-diabetes   . Pseudogout   . Urgency of urination     PAST SURGICAL HISTORY: Past Surgical History:  Procedure Laterality Date  . ANKLE SURGERY Right   . BREAST LUMPECTOMY Left 1996   fibroid  . COLONOSCOPY W/ POLYPECTOMY    . COLONOSCOPY WITH PROPOFOL N/A 09/02/2016   Procedure: COLONOSCOPY WITH PROPOFOL;  Surgeon: Milus Banister, MD;  Location: [facility];  Service: Endoscopy;  Laterality: N/A;  . EUS N/A 02/06/2015   Procedure: UPPER ENDOSCOPIC ULTRASOUND (EUS) LINEAR;  Surgeon: Milus Banister, MD;  Location: [facility];  Service: Endoscopy;  Laterality: N/A;  . EUS N/A 09/02/2016   Procedure: UPPER ENDOSCOPIC ULTRASOUND (EUS) LINEAR;  Surgeon: Milus Banister, MD;  Location: [facility];  Service: Endoscopy;  Laterality: N/A;  . EYE SURGERY Bilateral    lasix  14 yrs ago  . JOINT REPLACEMENT    . PARTIAL KNEE ARTHROPLASTY Left 11/26/2014   Procedure: LEFT UNICOMPARTMENTAL KNEE;  Surgeon: Renette Butters, MD;  Location: [place];  Service: Orthopedics;  Laterality: Left;  . TOTAL KNEE ARTHROPLASTY Right 04/09/2014   DR MURPHY  . TOTAL KNEE ARTHROPLASTY Right 04/09/2014   Procedure: TOTAL KNEE ARTHROPLASTY;  Surgeon: Renette Butters, MD;  Location: [place];  Service: Orthopedics;  Laterality: Right;  . TOTAL KNEE REVISION Left 11/03/2015   Procedure: TOTAL KNEE REVISION;  Surgeon: Renette Butters, MD;  Location: [place];  Service: Orthopedics;  Laterality: Left;    SOCIAL HISTORY: Social History   Tobacco Use  . Smoking status: Never Smoker  . Smokeless tobacco: Never Used  Substance Use Topics  . Alcohol use: No    Alcohol/week: 0.0 standard drinks  . Drug use: No    FAMILY HISTORY: Family History  Problem Relation Age of Onset  . Pancreatic cancer Mother        head of pancreas removed   . Diabetes Mother   . Hypertension Mother   . Diabetes Father   . Heart disease Father   . Hypertension Father   . Diabetes Maternal Grandmother   . Breast cancer Neg Hx   . Celiac disease Neg Hx   . Cirrhosis Neg Hx   . Clotting disorder Neg Hx   . Colitis Neg Hx   . Colon cancer Neg Hx   . Colon polyps Neg Hx   . Crohn's disease Neg Hx   . Cystic fibrosis Neg Hx   . Esophageal cancer Neg Hx   . Hemochromatosis Neg Hx   . Inflammatory bowel disease Neg Hx   . Irritable bowel syndrome Neg Hx   . Kidney disease Neg Hx   . Liver cancer Neg Hx   . Liver disease Neg Hx   . Ovarian cancer Neg Hx   . Prostate cancer Neg Hx   . Rectal cancer Neg Hx   . Stomach cancer Neg Hx   . Ulcerative colitis Neg Hx   . Uterine cancer Neg Hx   . Wilson's disease Neg Hx   . Goiter Neg Hx     ROS: Review of Systems  Constitutional: Positive for weight loss.  Gastrointestinal: Negative for nausea and vomiting.  Musculoskeletal:       Negative for muscle  weakness    PHYSICAL EXAM: Blood pressure (!) 156/83, pulse 88, temperature 97.9 F (36.6 C), temperature source Oral, height 5'  2" (1.575 m), weight 252 lb (114.3 kg), SpO2 98 %. Body mass index is 46.09 kg/m. Physical Exam  Constitutional: She is oriented to person, place, and time. She appears well-developed and well-nourished.  Cardiovascular: Normal rate.  Pulmonary/Chest: Effort normal.  Musculoskeletal: Normal range of motion.  Neurological: She is oriented to person, place, and time.  Skin: Skin is warm and dry.  Psychiatric: She has a normal mood and affect. Her behavior is normal.  Vitals reviewed.   RECENT LABS AND TESTS: BMET    Component Value Date/Time   NA 141 03/23/2018 1049   K 4.0 03/23/2018 1049   CL 101 03/23/2018 1049   CO2 24 03/23/2018 1049   GLUCOSE 133 (H) 03/23/2018 1049   GLUCOSE 156 (H) 05/04/2017 0034   BUN 10 03/23/2018 1049   CREATININE 0.66 03/23/2018 1049   CREATININE 0.65 01/22/2016 1130   CALCIUM 8.9 03/23/2018 1049   GFRNONAA 91 03/23/2018 1049   GFRAA 105 03/23/2018 1049   Lab Results  Component Value Date   HGBA1C 6.2 (H) 03/23/2018   HGBA1C 5.9 (A) 01/06/2018   HGBA1C 7.1 (H) 09/05/2017   HGBA1C 6.7 (H) 10/27/2015   HGBA1C 6.3 (H) 11/18/2014   Lab Results  Component Value Date   INSULIN 8.0 03/23/2018   INSULIN 10.7 09/05/2017   CBC    Component Value Date/Time   WBC 4.0 09/05/2017 0000   WBC 6.0 05/04/2017 0034   RBC 4.49 09/05/2017 0000   RBC 4.46 05/04/2017 0034   HGB 13.7 09/05/2017 0000   HCT 41.8 09/05/2017 0000   PLT 210 05/04/2017 0034   MCV 93 09/05/2017 0000   MCH 30.5 09/05/2017 0000   MCH 30.0 05/04/2017 0034   MCHC 32.8 09/05/2017 0000   MCHC 32.8 05/04/2017 0034   RDW 13.2 09/05/2017 0000   LYMPHSABS 1.4 09/05/2017 0000   MONOABS 0.4 08/05/2016 1618   EOSABS 0.1 09/05/2017 0000   BASOSABS 0.0 09/05/2017 0000   Iron/TIBC/Ferritin/ %Sat No results found for: IRON, TIBC, FERRITIN,  IRONPCTSAT Lipid Panel     Component Value Date/Time   CHOL 182 03/23/2018 1049   TRIG 123 03/23/2018 1049   HDL 53 03/23/2018 1049   LDLCALC 104 (H) 03/23/2018 1049   Hepatic Function Panel     Component Value Date/Time   PROT 6.4 03/23/2018 1049   ALBUMIN 4.2 03/23/2018 1049   AST 14 03/23/2018 1049   ALT 8 03/23/2018 1049   ALKPHOS 98 03/23/2018 1049   BILITOT 0.5 03/23/2018 1049      Component Value Date/Time   TSH 0.343 (L) 09/05/2017 0000   TSH 0.60 01/07/2017 0930   Results for HAYVEN, CROY (MRN [id number]) as of 04/11/2018 10:59  Ref. Range 03/23/2018 10:49  Vitamin D, 25-Hydroxy Latest Ref Range: 30.0 - 100.0 ng/mL 46.6   ASSESSMENT AND PLAN: Vitamin D deficiency  Class 3 severe obesity with serious comorbidity and body mass index (BMI) of 45.0 to 49.9 in adult, unspecified obesity type ([place])  PLAN:  Vitamin D Deficiency Lyllian was informed that low vitamin D levels contributes to fatigue and are associated with obesity, breast, and colon cancer. She agrees to increase her Vit D to 2,000 IU daily and will follow up for routine testing of vitamin D, at least 2-3 times per year. She was informed of the risk of over-replacement of vitamin D and agrees to not increase her dose unless she discusses this with [place] first. She agrees to follow up with our clinic as directed.  Obesity Mendy is currently in the action stage of change. As such, her goal is to continue with weight loss efforts She has agreed to follow the Category 2 plan Abeeha has been instructed to work up to a goal of 150 minutes of combined cardio and strengthening exercise per week for weight loss and overall health benefits. We discussed the following Behavioral Modification Strategies today: work on meal planning and easy cooking plans and holiday eating strategies   Ritika has agreed to follow up with our clinic in 2 to 3 weeks. She was informed of the importance of frequent follow up visits to  maximize her success with intensive lifestyle modifications for her multiple health conditions.   OBESITY BEHAVIORAL INTERVENTION VISIT  Today's visit was # 10   Starting weight: 263 lbs Starting date: 09/05/2017 Today's weight : 252 lbs Today's date: 04/05/2018 Total lbs lost to date: 11 At least 15 minutes were spent on discussing the following behavioral intervention visit.   ASK: We discussed the diagnosis of obesity with [name] today and Jenel agreed to give [place] permission to discuss obesity behavioral modification therapy today.  ASSESS: Sondra has the diagnosis of obesity and her BMI today is @TBMI @ [place] is in the action stage of change   ADVISE: Constanza was educated on the multiple health risks of obesity as well as the benefit of weight loss to improve her health. She was advised of the need for long term treatment and the importance of lifestyle modifications to improve her current health and to decrease her risk of future health problems.  AGREE: Multiple dietary modification options and treatment options were discussed and  Kaymarie agreed to follow the recommendations documented in the above note.  ARRANGE: Anaeli was educated on the importance of frequent visits to treat obesity as outlined per CMS and USPSTF guidelines and agreed to schedule her next follow up appointment today.  Corey Skains, am acting as transcriptionist for Abby Potash, PA-C I, Abby Potash, PA-C have reviewed above note and agree with its content

## 2018-04-25 DIAGNOSIS — M810 Age-related osteoporosis without current pathological fracture: Secondary | ICD-10-CM | POA: Diagnosis not present

## 2018-04-25 DIAGNOSIS — R5383 Other fatigue: Secondary | ICD-10-CM | POA: Diagnosis not present

## 2018-04-27 ENCOUNTER — Ambulatory Visit (INDEPENDENT_AMBULATORY_CARE_PROVIDER_SITE_OTHER): Payer: Medicare Other | Admitting: Family Medicine

## 2018-04-27 ENCOUNTER — Encounter (INDEPENDENT_AMBULATORY_CARE_PROVIDER_SITE_OTHER): Payer: Self-pay | Admitting: Family Medicine

## 2018-04-27 VITALS — BP 151/63 | HR 74 | Temp 97.4°F | Ht 62.0 in | Wt 253.0 lb

## 2018-04-27 DIAGNOSIS — Z6841 Body Mass Index (BMI) 40.0 and over, adult: Secondary | ICD-10-CM

## 2018-04-27 DIAGNOSIS — I10 Essential (primary) hypertension: Secondary | ICD-10-CM

## 2018-04-27 DIAGNOSIS — E66813 Obesity, class 3: Secondary | ICD-10-CM

## 2018-04-27 DIAGNOSIS — E559 Vitamin D deficiency, unspecified: Secondary | ICD-10-CM | POA: Diagnosis not present

## 2018-04-27 NOTE — Progress Notes (Signed)
Office: [phone]  /  Fax: [phone]   HPI:   Chief Complaint: OBESITY Jessica Good is here to discuss her progress with her obesity treatment plan. She is on the Category 2 plan and is following her eating plan approximately 20 % of the time. She states she is exercising 0 minutes 0 times per week. Jessica Good has a lot of family stress, her husband just was diagnosed with prostate cancer (early stages) and she has noticed significant increase in stress eating. She also has grandkids and great grandkids living with her.  Her weight is 253 lb (114.8 kg) today and has gained 1 pound since her last visit. She has lost 10 lbs since starting treatment with [place].  Hypertension Jessica Good is a 67 y.o. female with hypertension. Jessica Good's blood pressure is controlled on lisinopril. She denies chest pain, chest pressure, or headaches. She is working weight loss to help control her blood pressure with the goal of decreasing her risk of heart attack and stroke.   Vitamin D Deficiency Jessica Good has a diagnosis of vitamin D deficiency. She is currently taking OTC Vit D 2,000 IU daily. She notes fatigue and denies nausea, vomiting or muscle weakness.  ASSESSMENT AND PLAN:  Essential hypertension  Vitamin D deficiency  Class 3 severe obesity with serious comorbidity and body mass index (BMI) of 45.0 to 49.9 in adult, unspecified obesity type (Jessica Good)  PLAN:  Hypertension We discussed sodium restriction, working on healthy weight loss, and a regular exercise program as the means to achieve improved blood pressure control. Jessica Good agreed with this plan and agreed to follow up as directed. We will continue to monitor her blood pressure as well as her progress with the above lifestyle modifications. Jessica Good agrees to continue her medications and will watch for signs of hypotension as she continues her lifestyle modifications. We will follow up on blood pressure at next appointment. Jessica Good agrees to follow up with  our clinic in 2 weeks.  Vitamin D Deficiency Jessica Good was informed that low vitamin D levels contributes to fatigue and are associated with obesity, breast, and colon cancer. Jessica Good agrees to continue taking OTC Vit D and will follow up for routine testing of vitamin D, at least 2-3 times per year. She was informed of the risk of over-replacement of vitamin D and agrees to not increase her dose unless she discusses this with [place] first. Jessica Good agrees to follow up with our clinic in 2 weeks.  I spent > than 50% of the 15 minute visit on counseling as documented in the note.  Obesity Jessica Good is currently in the action stage of change. As such, her goal is to continue with weight loss efforts She has agreed to portion control better and make smarter food choices, such as increase vegetables and decrease simple carbohydrates  Jessica Good has been instructed to work up to a goal of 150 minutes of combined cardio and strengthening exercise per week for weight loss and overall health benefits. We discussed the following Behavioral Modification Strategies today: increasing lean protein intake, increasing vegetables, work on meal planning and easy cooking plans, holiday eating strategies  and emotional eating strategies, and planning for success   Jessica Good has agreed to follow up with our clinic in 2 weeks. She was informed of the importance of frequent follow up visits to maximize her success with intensive lifestyle modifications for her multiple health conditions.  ALLERGIES: Allergies  Allergen Reactions  . Demerol [Meperidine] Other (See Comments)    caused excessive  sleepiness  . Latex Rash    Pt. Stated she has a " latex allergy" and it "gives her a rash".  . Sulfa Antibiotics Other (See Comments)    : Flu-like symptoms  . Adhesive [Tape] Rash    Reaction to bandaids    MEDICATIONS: Current Outpatient Medications on File Prior to Visit  Medication Sig Dispense Refill  . acetaminophen (TYLENOL) 500  MG tablet Take 500 mg by mouth every 6 (six) hours as needed for moderate pain.    Jessica Good Kitchen aspirin 81 MG tablet Take 81 mg by mouth daily.    . baclofen (LIORESAL) 10 MG tablet Take 1 tablet by mouth daily.    . cholecalciferol (VITAMIN D) 1000 UNITS tablet Take 2,000 Units by mouth daily.     . citalopram (CELEXA) 10 MG tablet Take 10 mg by mouth daily.    . furosemide (LASIX) 40 MG tablet Take 40 mg by mouth daily as needed for fluid.     Jessica Good Kitchen ibuprofen (ADVIL,MOTRIN) 800 MG tablet Take 800 mg by mouth every 8 (eight) hours as needed.    Jessica Good Kitchen lisinopril (PRINIVIL,ZESTRIL) 20 MG tablet Take 1 tablet (20 mg total) by mouth 2 (two) times daily. 60 tablet 0  . Multiple Vitamin (MULTIVITAMIN WITH MINERALS) TABS Take 1 tablet by mouth daily.    . sucralfate (CARAFATE) 1 g tablet Take 1 g by mouth 3 (three) times daily as needed (ulcer).     . Teriparatide, Recombinant, (FORTEO) 600 MCG/2.4ML SOLN Inject 0.08 mLs (20 mcg total) into the skin daily. 2.24 mL 0  . thiamine (VITAMIN B-1) 100 MG tablet Take 100 mg by mouth daily.    . vitamin B-12 (CYANOCOBALAMIN) 500 MCG tablet Take 500 mcg by mouth daily.    . vitamin C (ASCORBIC ACID) 500 MG tablet Take 500 mg by mouth daily.     No current facility-administered medications on file prior to visit.     PAST MEDICAL HISTORY: Past Medical History:  Diagnosis Date  . Adenomatous colon polyp   . Anemia yrs ago   PMH  . Anxiety   . Cataract   . Constipation due to pain medication   . Edema of lower extremity    Takes lasix PRN  . Gallstones   . Gastritis   . GERD (gastroesophageal reflux disease)    with meds  . Hiatal hernia   . History of recurrent UTIs   . Hyperlipidemia   . Hypertension   . IBS (irritable bowel syndrome)   . Interstitial cystitis   . Loose, teeth    in the front of mouth  . Osteoarthritis   . Osteoporosis    osteoarthritis  . Pre-diabetes   . Pseudogout   . Urgency of urination     PAST SURGICAL HISTORY: Past Surgical  History:  Procedure Laterality Date  . ANKLE SURGERY Right   . BREAST LUMPECTOMY Left 1996   fibroid  . COLONOSCOPY W/ POLYPECTOMY    . COLONOSCOPY WITH PROPOFOL N/A 09/02/2016   Procedure: COLONOSCOPY WITH PROPOFOL;  Surgeon: Milus Banister, MD;  Location: [facility];  Service: Endoscopy;  Laterality: N/A;  . EUS N/A 02/06/2015   Procedure: UPPER ENDOSCOPIC ULTRASOUND (EUS) LINEAR;  Surgeon: Milus Banister, MD;  Location[facility];  Service: Endoscopy;  Laterality: N/A;  . EUS N/A 09/02/2016   Procedure: UPPER ENDOSCOPIC ULTRASOUND (EUS) LINEAR;  Surgeon: Milus Banister, MD;  [facility];  Service: Endoscopy;  Laterality: N/A;  . EYE SURGERY Bilateral  lasix 14 yrs ago  . JOINT REPLACEMENT    . PARTIAL KNEE ARTHROPLASTY Left 11/26/2014   Procedure: LEFT UNICOMPARTMENTAL KNEE;  Surgeon: Renette Butters, MD;  Location: [place];  Service: Orthopedics;  Laterality: Left;  . TOTAL KNEE ARTHROPLASTY Right 04/09/2014   DR MURPHY  . TOTAL KNEE ARTHROPLASTY Right 04/09/2014   Procedure: TOTAL KNEE ARTHROPLASTY;  Surgeon: Renette Butters, MD;  Location: [place];  Service: Orthopedics;  Laterality: Right;  . TOTAL KNEE REVISION Left 11/03/2015   Procedure: TOTAL KNEE REVISION;  Surgeon: Renette Butters, MD;  Location: [place];  Service: Orthopedics;  Laterality: Left;    SOCIAL HISTORY: Social History   Tobacco Use  . Smoking status: Never Smoker  . Smokeless tobacco: Never Used  Substance Use Topics  . Alcohol use: No    Alcohol/week: 0.0 standard drinks  . Drug use: No    FAMILY HISTORY: Family History  Problem Relation Age of Onset  . Pancreatic cancer Mother        head of pancreas removed   . Diabetes Mother   . Hypertension Mother   . Diabetes Father   . Heart disease Father   . Hypertension Father   . Diabetes Maternal Grandmother   . Breast cancer Neg Hx   . Celiac disease Neg Hx   . Cirrhosis Neg Hx   . Clotting disorder Neg Hx   . Colitis Neg Hx   .  Colon cancer Neg Hx   . Colon polyps Neg Hx   . Crohn's disease Neg Hx   . Cystic fibrosis Neg Hx   . Esophageal cancer Neg Hx   . Hemochromatosis Neg Hx   . Inflammatory bowel disease Neg Hx   . Irritable bowel syndrome Neg Hx   . Kidney disease Neg Hx   . Liver cancer Neg Hx   . Liver disease Neg Hx   . Ovarian cancer Neg Hx   . Prostate cancer Neg Hx   . Rectal cancer Neg Hx   . Stomach cancer Neg Hx   . Ulcerative colitis Neg Hx   . Uterine cancer Neg Hx   . Wilson's disease Neg Hx   . Goiter Neg Hx     ROS: Review of Systems  Constitutional: Positive for malaise/fatigue. Negative for weight loss.  Cardiovascular: Negative for chest pain.       Negative chest pressure  Gastrointestinal: Negative for nausea and vomiting.  Musculoskeletal:       Negative muscle weakness  Neurological: Negative for headaches.    PHYSICAL EXAM: Blood pressure (!) 151/63, pulse 74, temperature (!) 97.4 F (36.3 C), temperature source Oral, height 5\' 2"  (1.575 m), weight 253 lb (114.8 kg), SpO2 99 %. Body mass index is 46.27 kg/m. Physical Exam Vitals signs reviewed.  Constitutional:      Appearance: Normal appearance. She is obese.  Cardiovascular:     Rate and Rhythm: Normal rate.     Pulses: Normal pulses.  Pulmonary:     Effort: Pulmonary effort is normal.  Musculoskeletal: Normal range of motion.  Skin:    General: Skin is warm and dry.  Neurological:     Mental Status: She is alert and oriented to person, place, and time.  Psychiatric:        Mood and Affect: Mood normal.        Behavior: Behavior normal.     RECENT LABS AND TESTS: BMET    Component Value Date/Time   NA 141 03/23/2018  1049   K 4.0 03/23/2018 1049   CL 101 03/23/2018 1049   CO2 24 03/23/2018 1049   GLUCOSE 133 (H) 03/23/2018 1049   GLUCOSE 156 (H) 05/04/2017 0034   BUN 10 03/23/2018 1049   CREATININE 0.66 03/23/2018 1049   CREATININE 0.65 01/22/2016 1130   CALCIUM 8.9 03/23/2018 1049    GFRNONAA 91 03/23/2018 1049   GFRAA 105 03/23/2018 1049   Lab Results  Component Value Date   HGBA1C 6.2 (H) 03/23/2018   HGBA1C 5.9 (A) 01/06/2018   HGBA1C 7.1 (H) 09/05/2017   HGBA1C 6.7 (H) 10/27/2015   HGBA1C 6.3 (H) 11/18/2014   Lab Results  Component Value Date   INSULIN 8.0 03/23/2018   INSULIN 10.7 09/05/2017   CBC    Component Value Date/Time   WBC 4.0 09/05/2017 0000   WBC 6.0 05/04/2017 0034   RBC 4.49 09/05/2017 0000   RBC 4.46 05/04/2017 0034   HGB 13.7 09/05/2017 0000   HCT 41.8 09/05/2017 0000   PLT 210 05/04/2017 0034   MCV 93 09/05/2017 0000   MCH 30.5 09/05/2017 0000   MCH 30.0 05/04/2017 0034   MCHC 32.8 09/05/2017 0000   MCHC 32.8 05/04/2017 0034   RDW 13.2 09/05/2017 0000   LYMPHSABS 1.4 09/05/2017 0000   MONOABS 0.4 08/05/2016 1618   EOSABS 0.1 09/05/2017 0000   BASOSABS 0.0 09/05/2017 0000   Iron/TIBC/Ferritin/ %Sat No results found for: IRON, TIBC, FERRITIN, IRONPCTSAT Lipid Panel     Component Value Date/Time   CHOL 182 03/23/2018 1049   TRIG 123 03/23/2018 1049   HDL 53 03/23/2018 1049   LDLCALC 104 (H) 03/23/2018 1049   Hepatic Function Panel     Component Value Date/Time   PROT 6.4 03/23/2018 1049   ALBUMIN 4.2 03/23/2018 1049   AST 14 03/23/2018 1049   ALT 8 03/23/2018 1049   ALKPHOS 98 03/23/2018 1049   BILITOT 0.5 03/23/2018 1049      Component Value Date/Time   TSH 0.343 (L) 09/05/2017 0000   TSH 0.60 01/07/2017 0930      OBESITY BEHAVIORAL INTERVENTION VISIT  Today's visit was # 11   Starting weight: 263 lbs Starting date: 09/05/17 Today's weight : 253 lbs  Today's date: 04/27/2018 Total lbs lost to date: 10    ASK: We discussed the diagnosis of obesity with Jessica Good today and Jessica Good agreed to give [place] permission to discuss obesity behavioral modification therapy today.  ASSESS: Jessica Good has the diagnosis of obesity and her BMI today is 46.26 Jessica Good is in the action stage of change    ADVISE: Jessica Good was educated on the multiple health risks of obesity as well as the benefit of weight loss to improve her health. She was advised of the need for long term treatment and the importance of lifestyle modifications to improve her current health and to decrease her risk of future health problems.  AGREE: Multiple dietary modification options and treatment options were discussed and  Jessica Good agreed to follow the recommendations documented in the above note.  ARRANGE: Jessica Good was educated on the importance of frequent visits to treat obesity as outlined per CMS and USPSTF guidelines and agreed to schedule her next follow up appointment today.  I, Trixie Dredge, am acting as transcriptionist for Ilene Qua, MD  I have reviewed the above documentation for accuracy and completeness, and I agree with the above. - Ilene Qua, MD

## 2018-05-15 ENCOUNTER — Ambulatory Visit (INDEPENDENT_AMBULATORY_CARE_PROVIDER_SITE_OTHER): Payer: Medicare Other | Admitting: Family Medicine

## 2018-05-15 ENCOUNTER — Encounter (INDEPENDENT_AMBULATORY_CARE_PROVIDER_SITE_OTHER): Payer: Self-pay | Admitting: Family Medicine

## 2018-05-15 VITALS — BP 116/84 | HR 71 | Temp 97.5°F | Ht 62.0 in | Wt 256.0 lb

## 2018-05-15 DIAGNOSIS — Z794 Long term (current) use of insulin: Secondary | ICD-10-CM | POA: Diagnosis not present

## 2018-05-15 DIAGNOSIS — E1165 Type 2 diabetes mellitus with hyperglycemia: Secondary | ICD-10-CM

## 2018-05-15 DIAGNOSIS — Z6841 Body Mass Index (BMI) 40.0 and over, adult: Secondary | ICD-10-CM | POA: Diagnosis not present

## 2018-05-15 DIAGNOSIS — I10 Essential (primary) hypertension: Secondary | ICD-10-CM | POA: Diagnosis not present

## 2018-05-16 NOTE — Progress Notes (Signed)
Office: [phone]  /  Fax: [phone]   HPI:   Chief Complaint: OBESITY Jessica Good is here to discuss her progress with her obesity treatment plan. She is on the portion control better and make smarter food choices, such as increase vegetables and decrease simple carbohydrates and is following her eating plan approximately 10 % of the time. She states she is exercising 0 minutes 0 times per week. Jessica Good had a sad holiday season, her brother in-law died. Her grandchildren and great grandchildren have been very comforting to her. She has plans to enlist family members to eating on the plan with her.  Her weight is 256 lb (116.1 kg) today and has gained 3 pounds since her last visit. She has lost 8 lbs since starting treatment with [place].  Diabetes II with Hyperglycemia Jessica Good has a diagnosis of diabetes type II. Jessica Good states she is not checking BGs and she is not on any medications. She denies hypoglycemia. Last A1c was 6.2. She has been working on intensive lifestyle modifications including diet, exercise, and weight loss to help control her blood glucose levels.  Hypertension Jessica Good is a 68 y.o. female with hypertension. Ellenore's blood pressure is controlled today. She denies chest pain, chest pressure, or headaches. She is working weight loss to help control her blood pressure with the goal of decreasing her risk of heart attack and stroke.   ASSESSMENT AND PLAN:  Type 2 diabetes mellitus with hyperglycemia, with long-term current use of insulin (HCC)  Essential hypertension  Class 3 severe obesity with serious comorbidity and body mass index (BMI) of 45.0 to 49.9 in adult, unspecified obesity type ([place])  PLAN:  Diabetes II with Hyperglycemia Analina has been given extensive diabetes education by myself today including ideal fasting and post-prandial blood glucose readings, individual ideal Hgb A1c goals and hypoglycemia prevention. We discussed the importance of good blood  sugar control to decrease the likelihood of diabetic complications such as nephropathy, neuropathy, limb loss, blindness, coronary artery disease, and death. We discussed the importance of intensive lifestyle modification including diet, exercise and weight loss as the first line treatment for diabetes. We will follow up on labs in mid February. Jessica Good agrees to follow up with our clinic in 2 weeks.  Hypertension We discussed sodium restriction, working on healthy weight loss, and a regular exercise program as the means to achieve improved blood pressure control. Jessica Good agreed with this plan and agreed to follow up as directed. We will continue to monitor her blood pressure as well as her progress with the above lifestyle modifications. Jessica Good agrees to continue taking lisinopril and will watch for signs of hypotension as she continues her lifestyle modifications. Jessica Good agrees to follow up with our clinic in 2 weeks.  I spent > than 50% of the 15 minute visit on counseling as documented in the note.  Obesity Leonetta is currently in the action stage of change. As such, her goal is to continue with weight loss efforts She has agreed to follow the Category 2 plan +100 calories Jessica Good has been instructed to work up to a goal of 150 minutes of combined cardio and strengthening exercise per week for weight loss and overall health benefits. We discussed the following Behavioral Modification Strategies today: increasing lean protein intake, increasing vegetables, work on meal planning and easy cooking plans, and planning for success   Jessica Good has agreed to follow up with our clinic in 2 weeks. She was informed of the importance of frequent follow up  visits to maximize her success with intensive lifestyle modifications for her multiple health conditions.  ALLERGIES: Allergies  Allergen Reactions  . Demerol [Meperidine] Other (See Comments)    caused excessive sleepiness  . Latex Rash    Pt. Stated she  has a " latex allergy" and it "gives her a rash".  . Sulfa Antibiotics Other (See Comments)    : Flu-like symptoms  . Adhesive [Tape] Rash    Reaction to bandaids    MEDICATIONS: Current Outpatient Medications on File Prior to Visit  Medication Sig Dispense Refill  . acetaminophen (TYLENOL) 500 MG tablet Take 500 mg by mouth every 6 (six) hours as needed for moderate pain.    Marland Kitchen aspirin 81 MG tablet Take 81 mg by mouth daily.    . baclofen (LIORESAL) 10 MG tablet Take 1 tablet by mouth daily.    . cholecalciferol (VITAMIN D) 1000 UNITS tablet Take 2,000 Units by mouth daily.     . citalopram (CELEXA) 10 MG tablet Take 10 mg by mouth daily.    . furosemide (LASIX) 40 MG tablet Take 40 mg by mouth daily as needed for fluid.     Marland Kitchen ibuprofen (ADVIL,MOTRIN) 800 MG tablet Take 800 mg by mouth every 8 (eight) hours as needed.    Marland Kitchen lisinopril (PRINIVIL,ZESTRIL) 20 MG tablet Take 1 tablet (20 mg total) by mouth 2 (two) times daily. 60 tablet 0  . Multiple Vitamin (MULTIVITAMIN WITH MINERALS) TABS Take 1 tablet by mouth daily.    . sucralfate (CARAFATE) 1 g tablet Take 1 g by mouth 3 (three) times daily as needed (ulcer).     . Teriparatide, Recombinant, (FORTEO) 600 MCG/2.4ML SOLN Inject 0.08 mLs (20 mcg total) into the skin daily. 2.24 mL 0  . thiamine (VITAMIN B-1) 100 MG tablet Take 100 mg by mouth daily.    . vitamin B-12 (CYANOCOBALAMIN) 500 MCG tablet Take 500 mcg by mouth daily.    . vitamin C (ASCORBIC ACID) 500 MG tablet Take 500 mg by mouth daily.     No current facility-administered medications on file prior to visit.     PAST MEDICAL HISTORY: Past Medical History:  Diagnosis Date  . Adenomatous colon polyp   . Anemia yrs ago   PMH  . Anxiety   . Cataract   . Constipation due to pain medication   . Edema of lower extremity    Takes lasix PRN  . Gallstones   . Gastritis   . GERD (gastroesophageal reflux disease)    with meds  . Hiatal hernia   . History of recurrent UTIs     . Hyperlipidemia   . Hypertension   . IBS (irritable bowel syndrome)   . Interstitial cystitis   . Loose, teeth    in the front of mouth  . Osteoarthritis   . Osteoporosis    osteoarthritis  . Pre-diabetes   . Pseudogout   . Urgency of urination     PAST SURGICAL HISTORY: Past Surgical History:  Procedure Laterality Date  . ANKLE SURGERY Right   . BREAST LUMPECTOMY Left 1996   fibroid  . COLONOSCOPY W/ POLYPECTOMY    . COLONOSCOPY WITH PROPOFOL N/A 09/02/2016   Procedure: COLONOSCOPY WITH PROPOFOL;  Surgeon: Milus Banister, MD;  Location: [facility];  Service: Endoscopy;  Laterality: N/A;  . EUS N/A 02/06/2015   Procedure: UPPER ENDOSCOPIC ULTRASOUND (EUS) LINEAR;  Surgeon: Milus Banister, MD;  Location: [facility];  Service: Endoscopy;  Laterality: N/A;  .  EUS N/A 09/02/2016   Procedure: UPPER ENDOSCOPIC ULTRASOUND (EUS) LINEAR;  Surgeon: Milus Banister, MD;  Location: [facility];  Service: Endoscopy;  Laterality: N/A;  . EYE SURGERY Bilateral    lasix 14 yrs ago  . JOINT REPLACEMENT    . PARTIAL KNEE ARTHROPLASTY Left 11/26/2014   Procedure: LEFT UNICOMPARTMENTAL KNEE;  Surgeon: Renette Butters, MD;  Location: [place];  Service: Orthopedics;  Laterality: Left;  . TOTAL KNEE ARTHROPLASTY Right 04/09/2014   DR MURPHY  . TOTAL KNEE ARTHROPLASTY Right 04/09/2014   Procedure: TOTAL KNEE ARTHROPLASTY;  Surgeon: Renette Butters, MD;  Location: [place];  Service: Orthopedics;  Laterality: Right;  . TOTAL KNEE REVISION Left 11/03/2015   Procedure: TOTAL KNEE REVISION;  Surgeon: Renette Butters, MD;  Location: [place] [facility];  Service: Orthopedics;  Laterality: Left;    SOCIAL HISTORY: Social History   Tobacco Use  . Smoking status: Never Smoker  . Smokeless tobacco: Never Used  Substance Use Topics  . Alcohol use: No    Alcohol/week: 0.0 standard drinks  . Drug use: No    FAMILY HISTORY: Family History  Problem Relation Age of Onset  . Pancreatic cancer Mother        head  of pancreas removed   . Diabetes Mother   . Hypertension Mother   . Diabetes Father   . Heart disease Father   . Hypertension Father   . Diabetes Maternal Grandmother   . Breast cancer Neg Hx   . Celiac disease Neg Hx   . Cirrhosis Neg Hx   . Clotting disorder Neg Hx   . Colitis Neg Hx   . Colon cancer Neg Hx   . Colon polyps Neg Hx   . Crohn's disease Neg Hx   . Cystic fibrosis Neg Hx   . Esophageal cancer Neg Hx   . Hemochromatosis Neg Hx   . Inflammatory bowel disease Neg Hx   . Irritable bowel syndrome Neg Hx   . Kidney disease Neg Hx   . Liver cancer Neg Hx   . Liver disease Neg Hx   . Ovarian cancer Neg Hx   . Prostate cancer Neg Hx   . Rectal cancer Neg Hx   . Stomach cancer Neg Hx   . Ulcerative colitis Neg Hx   . Uterine cancer Neg Hx   . Wilson's disease Neg Hx   . Goiter Neg Hx     ROS: Review of Systems  Constitutional: Negative for weight loss.  Cardiovascular: Negative for chest pain.       Negative chest pressure  Neurological: Negative for headaches.  Endo/Heme/Allergies:       Negative hypoglycemia    PHYSICAL EXAM: Blood pressure 116/84, pulse 71, temperature (!) 97.5 F (36.4 C), temperature source Oral, height 5\' 2"  (1.575 m), weight 256 lb (116.1 kg), SpO2 98 %. Body mass index is 46.82 kg/m. Physical Exam Vitals signs reviewed.  Constitutional:      Appearance: Normal appearance. She is obese.  Cardiovascular:     Rate and Rhythm: Normal rate.     Pulses: Normal pulses.  Pulmonary:     Effort: Pulmonary effort is normal.  Musculoskeletal: Normal range of motion.  Skin:    General: Skin is warm and dry.  Neurological:     Mental Status: She is alert and oriented to person, place, and time.  Psychiatric:        Mood and Affect: Mood normal.  Behavior: Behavior normal.     RECENT LABS AND TESTS: BMET    Component Value Date/Time   NA 141 03/23/2018 1049   K 4.0 03/23/2018 1049   CL 101 03/23/2018 1049   CO2 24  03/23/2018 1049   GLUCOSE 133 (H) 03/23/2018 1049   GLUCOSE 156 (H) 05/04/2017 0034   BUN 10 03/23/2018 1049   CREATININE 0.66 03/23/2018 1049   CREATININE 0.65 01/22/2016 1130   CALCIUM 8.9 03/23/2018 1049   GFRNONAA 91 03/23/2018 1049   GFRAA 105 03/23/2018 1049   Lab Results  Component Value Date   HGBA1C 6.2 (H) 03/23/2018   HGBA1C 5.9 (A) 01/06/2018   HGBA1C 7.1 (H) 09/05/2017   HGBA1C 6.7 (H) 10/27/2015   HGBA1C 6.3 (H) 11/18/2014   Lab Results  Component Value Date   INSULIN 8.0 03/23/2018   INSULIN 10.7 09/05/2017   CBC    Component Value Date/Time   WBC 4.0 09/05/2017 0000   WBC 6.0 05/04/2017 0034   RBC 4.49 09/05/2017 0000   RBC 4.46 05/04/2017 0034   HGB 13.7 09/05/2017 0000   HCT 41.8 09/05/2017 0000   PLT 210 05/04/2017 0034   MCV 93 09/05/2017 0000   MCH 30.5 09/05/2017 0000   MCH 30.0 05/04/2017 0034   MCHC 32.8 09/05/2017 0000   MCHC 32.8 05/04/2017 0034   RDW 13.2 09/05/2017 0000   LYMPHSABS 1.4 09/05/2017 0000   MONOABS 0.4 08/05/2016 1618   EOSABS 0.1 09/05/2017 0000   BASOSABS 0.0 09/05/2017 0000   Iron/TIBC/Ferritin/ %Sat No results found for: IRON, TIBC, FERRITIN, IRONPCTSAT Lipid Panel     Component Value Date/Time   CHOL 182 03/23/2018 1049   TRIG 123 03/23/2018 1049   HDL 53 03/23/2018 1049   LDLCALC 104 (H) 03/23/2018 1049   Hepatic Function Panel     Component Value Date/Time   PROT 6.4 03/23/2018 1049   ALBUMIN 4.2 03/23/2018 1049   AST 14 03/23/2018 1049   ALT 8 03/23/2018 1049   ALKPHOS 98 03/23/2018 1049   BILITOT 0.5 03/23/2018 1049      Component Value Date/Time   TSH 0.343 (L) 09/05/2017 0000   TSH 0.60 01/07/2017 0930      OBESITY BEHAVIORAL INTERVENTION VISIT  Today's visit was # 12   Starting weight: 263 lbs Starting date: 09/05/17 Today's weight : 256 lbs  Today's date: 05/15/2018 Total lbs lost to date: 8    ASK: We discussed the diagnosis of obesity with Jessica Good today and Jessica Good agreed  to give [place] permission to discuss obesity behavioral modification therapy today.  ASSESS: Jessica Good has the diagnosis of obesity and her BMI today is 46.81 Jessica Good is in the action stage of change   ADVISE: Jessica Good was educated on the multiple health risks of obesity as well as the benefit of weight loss to improve her health. She was advised of the need for long term treatment and the importance of lifestyle modifications to improve her current health and to decrease her risk of future health problems.  AGREE: Multiple dietary modification options and treatment options were discussed and  Jessica Good agreed to follow the recommendations documented in the above note.  ARRANGE: Jose was educated on the importance of frequent visits to treat obesity as outlined per CMS and USPSTF guidelines and agreed to schedule her next follow up appointment today.  I, Trixie Dredge, am acting as transcriptionist for Ilene Qua, MD  I have reviewed the above documentation for accuracy and completeness, and  I agree with the above. - Ilene Qua, MD

## 2018-05-30 ENCOUNTER — Encounter (INDEPENDENT_AMBULATORY_CARE_PROVIDER_SITE_OTHER): Payer: Self-pay | Admitting: Family Medicine

## 2018-05-30 ENCOUNTER — Ambulatory Visit (INDEPENDENT_AMBULATORY_CARE_PROVIDER_SITE_OTHER): Payer: Medicare Other | Admitting: Family Medicine

## 2018-05-30 VITALS — BP 154/84 | HR 62 | Temp 98.3°F | Ht 62.0 in | Wt 254.0 lb

## 2018-05-30 DIAGNOSIS — E559 Vitamin D deficiency, unspecified: Secondary | ICD-10-CM

## 2018-05-30 DIAGNOSIS — I10 Essential (primary) hypertension: Secondary | ICD-10-CM

## 2018-05-30 DIAGNOSIS — Z6841 Body Mass Index (BMI) 40.0 and over, adult: Secondary | ICD-10-CM | POA: Diagnosis not present

## 2018-05-31 NOTE — Progress Notes (Signed)
Office: [phone]  /  Fax: [phone]   HPI:   Chief Complaint: OBESITY Jessica Good is here to discuss her progress with her obesity treatment plan. She is on the Category 2 plan + 100 calories and is following her eating plan approximately 40 % of the time. She states she is walking for 20-30 minutes 2-3 times per week. Jessica Good has tried hard to try to get protein. She is getting in microwaveable dinners for lunch and working on getting in 4-6 oz of meat at dinner. She is still struggling with nausea at breakfast time, so she is doing a Boost drink at breakfast time.  Her weight is 254 lb (115.2 kg) today and has had a weight loss of 2 pounds over a period of 2 weeks since her last visit. She has lost 9 lbs since starting treatment with [place].  Hypertension Jessica Good is a 68 y.o. female with hypertension. Jessica Good's blood pressure is elevated today. She is checking at home and states blood pressure is controlled. She denies chest pain, chest pressure, or headaches. She is working on weight loss to help control her blood pressure with the goal of decreasing her risk of heart attack and stroke. Jessica Good's blood pressure is not currently controlled.  Vitamin D Deficiency Jessica Good has a diagnosis of vitamin D deficiency. She is currently taking OTC Vit D. She notes fatigue and denies nausea, vomiting or muscle weakness.  ASSESSMENT AND PLAN:  Essential hypertension  Vitamin D deficiency  Class 3 severe obesity with serious comorbidity and body mass index (BMI) of 45.0 to 49.9 in adult, unspecified obesity type ([place])  PLAN:  Hypertension We discussed sodium restriction, working on healthy weight loss, and a regular exercise program as the means to achieve improved blood pressure control. Jessica Good agreed with this plan and agreed to follow up as directed. We will continue to monitor her blood pressure as well as her progress with the above lifestyle modifications. Jessica Good agrees to continue  taking lisinopril 20 mg BID and will watch for signs of hypotension as she continues her lifestyle modifications. Jessica Good agrees to follow up with our clinic in 2 weeks.  Vitamin D Deficiency Jessica Good was informed that low vitamin D levels contributes to fatigue and are associated with obesity, breast, and colon cancer. Jessica Good agrees to continue taking OTC Vit D supplementation and will follow up for routine testing of vitamin D, at least 2-3 times per year. She was informed of the risk of over-replacement of vitamin D and agrees to not increase her dose unless she discusses this with [place] first. Jessica Good agrees to follow up with our clinic in 2 weeks.  I spent > than 50% of the 15 minute visit on counseling as documented in the note.  Obesity Jessica Good is currently in the action stage of change. As such, her goal is to continue with weight loss efforts She has agreed to follow the Category 2 plan + 100 calories Jessica Good has been instructed to work up to a goal of 150 minutes of combined cardio and strengthening exercise per week for weight loss and overall health benefits. We discussed the following Behavioral Modification Strategies today: increasing lean protein intake, increasing vegetables, work on meal planning and easy cooking plans, better snacking choices, and planning for success   Jessica Good has agreed to follow up with our clinic in 2 weeks. She was informed of the importance of frequent follow up visits to maximize her success with intensive lifestyle modifications for her multiple health  conditions.  ALLERGIES: Allergies  Allergen Reactions  . Demerol [Meperidine] Other (See Comments)    caused excessive sleepiness  . Latex Rash    Pt. Stated she has a " latex allergy" and it "gives her a rash".  . Sulfa Antibiotics Other (See Comments)    : Flu-like symptoms  . Adhesive [Tape] Rash    Reaction to bandaids    MEDICATIONS: Current Outpatient Medications on File Prior to Visit  Medication  Sig Dispense Refill  . acetaminophen (TYLENOL) 500 MG tablet Take 500 mg by mouth every 6 (six) hours as needed for moderate pain.    Marland Kitchen aspirin 81 MG tablet Take 81 mg by mouth daily.    . baclofen (LIORESAL) 10 MG tablet Take 1 tablet by mouth daily.    . cholecalciferol (VITAMIN D) 1000 UNITS tablet Take 2,000 Units by mouth daily.     . citalopram (CELEXA) 10 MG tablet Take 10 mg by mouth daily.    . furosemide (LASIX) 40 MG tablet Take 40 mg by mouth daily as needed for fluid.     Marland Kitchen ibuprofen (ADVIL,MOTRIN) 800 MG tablet Take 800 mg by mouth every 8 (eight) hours as needed.    Marland Kitchen lisinopril (PRINIVIL,ZESTRIL) 20 MG tablet Take 1 tablet (20 mg total) by mouth 2 (two) times daily. 60 tablet 0  . Multiple Vitamin (MULTIVITAMIN WITH MINERALS) TABS Take 1 tablet by mouth daily.    . sucralfate (CARAFATE) 1 g tablet Take 1 g by mouth 3 (three) times daily as needed (ulcer).     . Teriparatide, Recombinant, (FORTEO) 600 MCG/2.4ML SOLN Inject 0.08 mLs (20 mcg total) into the skin daily. 2.24 mL 0  . thiamine (VITAMIN B-1) 100 MG tablet Take 100 mg by mouth daily.    . vitamin B-12 (CYANOCOBALAMIN) 500 MCG tablet Take 500 mcg by mouth daily.    . vitamin C (ASCORBIC ACID) 500 MG tablet Take 500 mg by mouth daily.     No current facility-administered medications on file prior to visit.     PAST MEDICAL HISTORY: Past Medical History:  Diagnosis Date  . Adenomatous colon polyp   . Anemia yrs ago   PMH  . Anxiety   . Cataract   . Constipation due to pain medication   . Edema of lower extremity    Takes lasix PRN  . Gallstones   . Gastritis   . GERD (gastroesophageal reflux disease)    with meds  . Hiatal hernia   . History of recurrent UTIs   . Hyperlipidemia   . Hypertension   . IBS (irritable bowel syndrome)   . Interstitial cystitis   . Loose, teeth    in the front of mouth  . Osteoarthritis   . Osteoporosis    osteoarthritis  . Pre-diabetes   . Pseudogout   . Urgency of  urination     PAST SURGICAL HISTORY: Past Surgical History:  Procedure Laterality Date  . ANKLE SURGERY Right   . BREAST LUMPECTOMY Left 1996   fibroid  . COLONOSCOPY W/ POLYPECTOMY    . COLONOSCOPY WITH PROPOFOL N/A 09/02/2016   Procedure: COLONOSCOPY WITH PROPOFOL;  Surgeon: Milus Banister, MD;  Location: [facility];  Service: Endoscopy;  Laterality: N/A;  . EUS N/A 02/06/2015   Procedure: UPPER ENDOSCOPIC ULTRASOUND (EUS) LINEAR;  Surgeon: Milus Banister, MD;  Location: [facility];  Service: Endoscopy;  Laterality: N/A;  . EUS N/A 09/02/2016   Procedure: UPPER ENDOSCOPIC ULTRASOUND (EUS) LINEAR;  Surgeon:  Milus Banister, MD;  Location: [facility];  Service: Endoscopy;  Laterality: N/A;  . EYE SURGERY Bilateral    lasix 14 yrs ago  . JOINT REPLACEMENT    . PARTIAL KNEE ARTHROPLASTY Left 11/26/2014   Procedure: LEFT UNICOMPARTMENTAL KNEE;  Surgeon: Renette Butters, MD;  Location: [facility] [place];  Service: Orthopedics;  Laterality: Left;  . TOTAL KNEE ARTHROPLASTY Right 04/09/2014   DR MURPHY  . TOTAL KNEE ARTHROPLASTY Right 04/09/2014   Procedure: TOTAL KNEE ARTHROPLASTY;  Surgeon: Renette Butters, MD;  Location: [place];  Service: Orthopedics;  Laterality: Right;  . TOTAL KNEE REVISION Left 11/03/2015   Procedure: TOTAL KNEE REVISION;  Surgeon: Renette Butters, MD;  Location: [place];  Service: Orthopedics;  Laterality: Left;    SOCIAL HISTORY: Social History   Tobacco Use  . Smoking status: Never Smoker  . Smokeless tobacco: Never Used  Substance Use Topics  . Alcohol use: No    Alcohol/week: 0.0 standard drinks  . Drug use: No    FAMILY HISTORY: Family History  Problem Relation Age of Onset  . Pancreatic cancer Mother        head of pancreas removed   . Diabetes Mother   . Hypertension Mother   . Diabetes Father   . Heart disease Father   . Hypertension Father   . Diabetes Maternal Grandmother   . Breast cancer Neg Hx   . Celiac disease Neg Hx   . Cirrhosis Neg Hx     . Clotting disorder Neg Hx   . Colitis Neg Hx   . Colon cancer Neg Hx   . Colon polyps Neg Hx   . Crohn's disease Neg Hx   . Cystic fibrosis Neg Hx   . Esophageal cancer Neg Hx   . Hemochromatosis Neg Hx   . Inflammatory bowel disease Neg Hx   . Irritable bowel syndrome Neg Hx   . Kidney disease Neg Hx   . Liver cancer Neg Hx   . Liver disease Neg Hx   . Ovarian cancer Neg Hx   . Prostate cancer Neg Hx   . Rectal cancer Neg Hx   . Stomach cancer Neg Hx   . Ulcerative colitis Neg Hx   . Uterine cancer Neg Hx   . Wilson's disease Neg Hx   . Goiter Neg Hx     ROS: Review of Systems  Constitutional: Positive for malaise/fatigue and weight loss.  Cardiovascular: Negative for chest pain.       Negative chest pressure  Gastrointestinal: Negative for nausea and vomiting.  Musculoskeletal:       Negative muscle weakness  Neurological: Negative for headaches.    PHYSICAL EXAM: Blood pressure (!) 154/84, pulse 62, temperature 98.3 F (36.8 C), temperature source Oral, height 5\' 2"  (1.575 m), weight 254 lb (115.2 kg), SpO2 99 %. Body mass index is 46.46 kg/m. Physical Exam Vitals signs reviewed.  Constitutional:      Appearance: Normal appearance. She is obese.  Cardiovascular:     Rate and Rhythm: Normal rate.     Pulses: Normal pulses.  Pulmonary:     Effort: Pulmonary effort is normal.     Breath sounds: Normal breath sounds.  Musculoskeletal: Normal range of motion.  Skin:    General: Skin is warm and dry.  Neurological:     Mental Status: She is alert and oriented to person, place, and time.  Psychiatric:        Mood and Affect: Mood normal.  Behavior: Behavior normal.     RECENT LABS AND TESTS: BMET    Component Value Date/Time   NA 141 03/23/2018 1049   K 4.0 03/23/2018 1049   CL 101 03/23/2018 1049   CO2 24 03/23/2018 1049   GLUCOSE 133 (H) 03/23/2018 1049   GLUCOSE 156 (H) 05/04/2017 0034   BUN 10 03/23/2018 1049   CREATININE 0.66 03/23/2018  1049   CREATININE 0.65 01/22/2016 1130   CALCIUM 8.9 03/23/2018 1049   GFRNONAA 91 03/23/2018 1049   GFRAA 105 03/23/2018 1049   Lab Results  Component Value Date   HGBA1C 6.2 (H) 03/23/2018   HGBA1C 5.9 (A) 01/06/2018   HGBA1C 7.1 (H) 09/05/2017   HGBA1C 6.7 (H) 10/27/2015   HGBA1C 6.3 (H) 11/18/2014   Lab Results  Component Value Date   INSULIN 8.0 03/23/2018   INSULIN 10.7 09/05/2017   CBC    Component Value Date/Time   WBC 4.0 09/05/2017 0000   WBC 6.0 05/04/2017 0034   RBC 4.49 09/05/2017 0000   RBC 4.46 05/04/2017 0034   HGB 13.7 09/05/2017 0000   HCT 41.8 09/05/2017 0000   PLT 210 05/04/2017 0034   MCV 93 09/05/2017 0000   MCH 30.5 09/05/2017 0000   MCH 30.0 05/04/2017 0034   MCHC 32.8 09/05/2017 0000   MCHC 32.8 05/04/2017 0034   RDW 13.2 09/05/2017 0000   LYMPHSABS 1.4 09/05/2017 0000   MONOABS 0.4 08/05/2016 1618   EOSABS 0.1 09/05/2017 0000   BASOSABS 0.0 09/05/2017 0000   Iron/TIBC/Ferritin/ %Sat No results found for: IRON, TIBC, FERRITIN, IRONPCTSAT Lipid Panel     Component Value Date/Time   CHOL 182 03/23/2018 1049   TRIG 123 03/23/2018 1049   HDL 53 03/23/2018 1049   LDLCALC 104 (H) 03/23/2018 1049   Hepatic Function Panel     Component Value Date/Time   PROT 6.4 03/23/2018 1049   ALBUMIN 4.2 03/23/2018 1049   AST 14 03/23/2018 1049   ALT 8 03/23/2018 1049   ALKPHOS 98 03/23/2018 1049   BILITOT 0.5 03/23/2018 1049      Component Value Date/Time   TSH 0.343 (L) 09/05/2017 0000   TSH 0.60 01/07/2017 0930      OBESITY BEHAVIORAL INTERVENTION VISIT  Today's visit was # 13   Starting weight: 263 lbs Starting date: 09/05/17 Today's weight : 254 lbs Today's date: 05/30/2018 Total lbs lost to date: 9    ASK: We discussed the diagnosis of obesity with Jessica Good today and Jessica Good agreed to give [place] permission to discuss obesity behavioral modification therapy today.  ASSESS: Jessica Good has the diagnosis of obesity and her BMI  today is 46.45 Jessica Good is in the action stage of change   ADVISE: Mihira was educated on the multiple health risks of obesity as well as the benefit of weight loss to improve her health. She was advised of the need for long term treatment and the importance of lifestyle modifications to improve her current health and to decrease her risk of future health problems.  AGREE: Multiple dietary modification options and treatment options were discussed and  Jessica Good agreed to follow the recommendations documented in the above note.  ARRANGE: Jessica Good was educated on the importance of frequent visits to treat obesity as outlined per CMS and USPSTF guidelines and agreed to schedule her next follow up appointment today.  I, Trixie Dredge, am acting as transcriptionist for Ilene Qua, MD  I have reviewed the above documentation for accuracy and completeness, and I  agree with the above. - Ilene Qua, MD

## 2018-06-07 DIAGNOSIS — J069 Acute upper respiratory infection, unspecified: Secondary | ICD-10-CM | POA: Diagnosis not present

## 2018-06-07 DIAGNOSIS — R300 Dysuria: Secondary | ICD-10-CM | POA: Diagnosis not present

## 2018-06-13 ENCOUNTER — Ambulatory Visit (INDEPENDENT_AMBULATORY_CARE_PROVIDER_SITE_OTHER): Payer: Medicare Other | Admitting: Family Medicine

## 2018-06-21 ENCOUNTER — Ambulatory Visit (INDEPENDENT_AMBULATORY_CARE_PROVIDER_SITE_OTHER): Payer: Medicare Other | Admitting: Family Medicine

## 2018-06-21 ENCOUNTER — Encounter (INDEPENDENT_AMBULATORY_CARE_PROVIDER_SITE_OTHER): Payer: Self-pay | Admitting: Family Medicine

## 2018-06-21 VITALS — BP 171/97 | HR 77 | Temp 97.9°F | Ht 62.0 in | Wt 255.0 lb

## 2018-06-21 DIAGNOSIS — E1165 Type 2 diabetes mellitus with hyperglycemia: Secondary | ICD-10-CM

## 2018-06-21 DIAGNOSIS — Z6841 Body Mass Index (BMI) 40.0 and over, adult: Secondary | ICD-10-CM | POA: Diagnosis not present

## 2018-06-21 DIAGNOSIS — I10 Essential (primary) hypertension: Secondary | ICD-10-CM

## 2018-06-21 DIAGNOSIS — E7849 Other hyperlipidemia: Secondary | ICD-10-CM | POA: Diagnosis not present

## 2018-06-21 MED ORDER — LISINOPRIL 20 MG PO TABS: 20.0000 mg | ORAL_TABLET | Freq: Two times a day (BID) | ORAL | 0 refills | Status: DC

## 2018-06-22 NOTE — Progress Notes (Signed)
Office: [phone]  /  Fax: [phone]   HPI:   Chief Complaint: OBESITY Jessica Good is here to discuss her progress with her obesity treatment plan. She is on the Category 2 plan + 100 calories and is following her eating plan approximately 30 % of the time. She states she is exercising 0 minutes 0 times per week. Jessica Good has been ill and has struggled with eating all of her food. Her appetite has been up and down. She is having nausea and only and the only food that is tolerable is carbohydrates and meat.  Her weight is 255 lb (115.7 kg) today and has gained 1 pound since her last visit. She has lost 8 lbs since starting treatment with [place].  Diabetes II with Hyperglycemia Jessica Good has a diagnosis of diabetes type II. Jessica Good notes significant carbohydrate cravings. She is not on medications and denies hypoglycemia. Last A1c was 6.2. She has been working on intensive lifestyle modifications including diet, exercise, and weight loss to help control her blood glucose levels.  Hyperlipidemia Jessica Good has hyperlipidemia and has been trying to improve her cholesterol levels with intensive lifestyle modification including a low saturated fat diet, exercise and weight loss. She denies any chest pain, claudication or myalgias.  Hypertension Jessica Good is a 68 y.o. female with hypertension. Jessica Good's blood pressure is elevated today. She is only taking lisinopril 1 time per day. She denies chest pain. She is working on weight loss to help control her blood pressure with the goal of decreasing her risk of heart attack and stroke. Jessica Good's blood pressure is not currently controlled.  ASSESSMENT AND PLAN:  Type 2 diabetes mellitus with hyperglycemia, without long-term current use of insulin ([place])  Other hyperlipidemia  Essential hypertension - Plan: lisinopril (PRINIVIL,ZESTRIL) 20 MG tablet  Class 3 severe obesity with serious comorbidity and body mass index (BMI) of 45.0 to 49.9 in adult,  unspecified obesity type ([place])  PLAN:  Diabetes II with Hyperglycemia Jessica Good has been given extensive diabetes education by myself today including ideal fasting and post-prandial blood glucose readings, individual ideal Hgb A1c goals and hypoglycemia prevention. We discussed the importance of good blood sugar control to decrease the likelihood of diabetic complications such as nephropathy, neuropathy, limb loss, blindness, coronary artery disease, and death. We discussed the importance of intensive lifestyle modification including diet, exercise and weight loss as the first line treatment for diabetes. We will repeat labs at next appointment. Jessica Good agrees to follow up with our clinic in 2 weeks. Hyperlipidemia Jessica Good was informed of the American Heart Association Guidelines emphasizing intensive lifestyle modifications as the first line treatment for hyperlipidemia. We discussed many lifestyle modifications today in depth, and Jessica Good will continue to work on decreasing saturated fats such as fatty red meat, butter and many fried foods. She will also increase vegetables and lean protein in her diet and continue to work on exercise and weight loss efforts.  Hypertension We discussed sodium restriction, working on healthy weight loss, and a regular exercise program as the means to achieve improved blood pressure control. Jessica Good agreed with this plan and agreed to follow up as directed. We will continue to monitor her blood pressure as well as her progress with the above lifestyle modifications. Jessica Good will continue her medications as prescribed and will watch for signs of hypotension as she continues her lifestyle modifications. We will repeat FLP at next appointment. Jessica Good agrees to follow up with our clinic in 2 weeks.  Obesity Jessica Good is currently in the  action stage of change. As such, her goal is to continue with weight loss efforts She has agreed to keep a food journal with 250-350 calories and 20  grams of protein at breakfast daily and follow the Category 2 plan Jessica Good has been instructed to work up to a goal of 150 minutes of combined cardio and strengthening exercise per week for weight loss and overall health benefits. We discussed the following Behavioral Modification Strategies today: increasing lean protein intake, increasing vegetables, work on meal planning and easy cooking plans, planning for success, and keep a strict food journal   Jessica Good has agreed to follow up with our clinic in 2 weeks. She was informed of the importance of frequent follow up visits to maximize her success with intensive lifestyle modifications for her multiple health conditions.  ALLERGIES: Allergies  Allergen Reactions  . Demerol [Meperidine] Other (See Comments)    caused excessive sleepiness  . Latex Rash    Pt. Stated she has a " latex allergy" and it "gives her a rash".  . Sulfa Antibiotics Other (See Comments)    : Flu-like symptoms  . Adhesive [Tape] Rash    Reaction to bandaids    MEDICATIONS: Current Outpatient Medications on File Prior to Visit  Medication Sig Dispense Refill  . acetaminophen (TYLENOL) 500 MG tablet Take 500 mg by mouth every 6 (six) hours as needed for moderate pain.    Marland Kitchen aspirin 81 MG tablet Take 81 mg by mouth daily.    . baclofen (LIORESAL) 10 MG tablet Take 1 tablet by mouth daily.    . cholecalciferol (VITAMIN D) 1000 UNITS tablet Take 2,000 Units by mouth daily.     . citalopram (CELEXA) 10 MG tablet Take 10 mg by mouth daily.    . furosemide (LASIX) 40 MG tablet Take 40 mg by mouth daily as needed for fluid.     Marland Kitchen ibuprofen (ADVIL,MOTRIN) 800 MG tablet Take 800 mg by mouth every 8 (eight) hours as needed.    . Multiple Vitamin (MULTIVITAMIN WITH MINERALS) TABS Take 1 tablet by mouth daily.    . sucralfate (CARAFATE) 1 g tablet Take 1 g by mouth 3 (three) times daily as needed (ulcer).     . Teriparatide, Recombinant, (FORTEO) 600 MCG/2.4ML SOLN Inject 0.08 mLs  (20 mcg total) into the skin daily. 2.24 mL 0  . thiamine (VITAMIN B-1) 100 MG tablet Take 100 mg by mouth daily.    . vitamin B-12 (CYANOCOBALAMIN) 500 MCG tablet Take 500 mcg by mouth daily.    . vitamin C (ASCORBIC ACID) 500 MG tablet Take 500 mg by mouth daily.     No current facility-administered medications on file prior to visit.     PAST MEDICAL HISTORY: Past Medical History:  Diagnosis Date  . Adenomatous colon polyp   . Anemia yrs ago   PMH  . Anxiety   . Cataract   . Constipation due to pain medication   . Edema of lower extremity    Takes lasix PRN  . Gallstones   . Gastritis   . GERD (gastroesophageal reflux disease)    with meds  . Hiatal hernia   . History of recurrent UTIs   . Hyperlipidemia   . Hypertension   . IBS (irritable bowel syndrome)   . Interstitial cystitis   . Loose, teeth    in the front of mouth  . Osteoarthritis   . Osteoporosis    osteoarthritis  . Pre-diabetes   . Pseudogout   .  Urgency of urination     PAST SURGICAL HISTORY: Past Surgical History:  Procedure Laterality Date  . ANKLE SURGERY Right   . BREAST LUMPECTOMY Left 1996   fibroid  . COLONOSCOPY W/ POLYPECTOMY    . COLONOSCOPY WITH PROPOFOL N/A 09/02/2016   Procedure: COLONOSCOPY WITH PROPOFOL;  Surgeon: Milus Banister, MD;  Location: [facility];  Service: Endoscopy;  Laterality: N/A;  . EUS N/A 02/06/2015   Procedure: UPPER ENDOSCOPIC ULTRASOUND (EUS) LINEAR;  Surgeon: Milus Banister, MD;  Location: [facility];  Service: Endoscopy;  Laterality: N/A;  . EUS N/A 09/02/2016   Procedure: UPPER ENDOSCOPIC ULTRASOUND (EUS) LINEAR;  Surgeon: Milus Banister, MD;  Location: [facility];  Service: Endoscopy;  Laterality: N/A;  . EYE SURGERY Bilateral    lasix 14 yrs ago  . JOINT REPLACEMENT    . PARTIAL KNEE ARTHROPLASTY Left 11/26/2014   Procedure: LEFT UNICOMPARTMENTAL KNEE;  Surgeon: Renette Butters, MD;  Location: [place];  Service: Orthopedics;  Laterality: Left;  .  TOTAL KNEE ARTHROPLASTY Right 04/09/2014   DR MURPHY  . TOTAL KNEE ARTHROPLASTY Right 04/09/2014   Procedure: TOTAL KNEE ARTHROPLASTY;  Surgeon: Renette Butters, MD;  Location: [place];  Service: Orthopedics;  Laterality: Right;  . TOTAL KNEE REVISION Left 11/03/2015   Procedure: TOTAL KNEE REVISION;  Surgeon: Renette Butters, MD;  Location: [place];  Service: Orthopedics;  Laterality: Left;    SOCIAL HISTORY: Social History   Tobacco Use  . Smoking status: Never Smoker  . Smokeless tobacco: Never Used  Substance Use Topics  . Alcohol use: No    Alcohol/week: 0.0 standard drinks  . Drug use: No    FAMILY HISTORY: Family History  Problem Relation Age of Onset  . Pancreatic cancer Mother        head of pancreas removed   . Diabetes Mother   . Hypertension Mother   . Diabetes Father   . Heart disease Father   . Hypertension Father   . Diabetes Maternal Grandmother   . Breast cancer Neg Hx   . Celiac disease Neg Hx   . Cirrhosis Neg Hx   . Clotting disorder Neg Hx   . Colitis Neg Hx   . Colon cancer Neg Hx   . Colon polyps Neg Hx   . Crohn's disease Neg Hx   . Cystic fibrosis Neg Hx   . Esophageal cancer Neg Hx   . Hemochromatosis Neg Hx   . Inflammatory bowel disease Neg Hx   . Irritable bowel syndrome Neg Hx   . Kidney disease Neg Hx   . Liver cancer Neg Hx   . Liver disease Neg Hx   . Ovarian cancer Neg Hx   . Prostate cancer Neg Hx   . Rectal cancer Neg Hx   . Stomach cancer Neg Hx   . Ulcerative colitis Neg Hx   . Uterine cancer Neg Hx   . Wilson's disease Neg Hx   . Goiter Neg Hx     ROS: Review of Systems  Constitutional: Negative for weight loss.  Cardiovascular: Negative for chest pain and claudication.  Musculoskeletal: Negative for myalgias.  Endo/Heme/Allergies:       Negative hypoglycemia    PHYSICAL EXAM: Blood pressure (!) 171/97, pulse 77, temperature 97.9 F (36.6 C), temperature source Oral, height 5\' 2"  (1.575 m), weight 255 lb (115.7  kg), SpO2 99 %. Body mass index is 46.64 kg/m. Physical Exam Vitals signs reviewed.  Constitutional:  Appearance: Normal appearance. She is obese.  Cardiovascular:     Rate and Rhythm: Normal rate.     Pulses: Normal pulses.  Pulmonary:     Effort: Pulmonary effort is normal.     Breath sounds: Normal breath sounds.  Musculoskeletal: Normal range of motion.  Skin:    General: Skin is warm and dry.  Neurological:     Mental Status: She is alert and oriented to person, place, and time.  Psychiatric:        Mood and Affect: Mood normal.        Behavior: Behavior normal.     RECENT LABS AND TESTS: BMET    Component Value Date/Time   NA 141 03/23/2018 1049   K 4.0 03/23/2018 1049   CL 101 03/23/2018 1049   CO2 24 03/23/2018 1049   GLUCOSE 133 (H) 03/23/2018 1049   GLUCOSE 156 (H) 05/04/2017 0034   BUN 10 03/23/2018 1049   CREATININE 0.66 03/23/2018 1049   CREATININE 0.65 01/22/2016 1130   CALCIUM 8.9 03/23/2018 1049   GFRNONAA 91 03/23/2018 1049   GFRAA 105 03/23/2018 1049   Lab Results  Component Value Date   HGBA1C 6.2 (H) 03/23/2018   HGBA1C 5.9 (A) 01/06/2018   HGBA1C 7.1 (H) 09/05/2017   HGBA1C 6.7 (H) 10/27/2015   HGBA1C 6.3 (H) 11/18/2014   Lab Results  Component Value Date   INSULIN 8.0 03/23/2018   INSULIN 10.7 09/05/2017   CBC    Component Value Date/Time   WBC 4.0 09/05/2017 0000   WBC 6.0 05/04/2017 0034   RBC 4.49 09/05/2017 0000   RBC 4.46 05/04/2017 0034   HGB 13.7 09/05/2017 0000   HCT 41.8 09/05/2017 0000   PLT 210 05/04/2017 0034   MCV 93 09/05/2017 0000   MCH 30.5 09/05/2017 0000   MCH 30.0 05/04/2017 0034   MCHC 32.8 09/05/2017 0000   MCHC 32.8 05/04/2017 0034   RDW 13.2 09/05/2017 0000   LYMPHSABS 1.4 09/05/2017 0000   MONOABS 0.4 08/05/2016 1618   EOSABS 0.1 09/05/2017 0000   BASOSABS 0.0 09/05/2017 0000   Iron/TIBC/Ferritin/ %Sat No results found for: IRON, TIBC, FERRITIN, IRONPCTSAT Lipid Panel     Component Value  Date/Time   CHOL 182 03/23/2018 1049   TRIG 123 03/23/2018 1049   HDL 53 03/23/2018 1049   LDLCALC 104 (H) 03/23/2018 1049   Hepatic Function Panel     Component Value Date/Time   PROT 6.4 03/23/2018 1049   ALBUMIN 4.2 03/23/2018 1049   AST 14 03/23/2018 1049   ALT 8 03/23/2018 1049   ALKPHOS 98 03/23/2018 1049   BILITOT 0.5 03/23/2018 1049      Component Value Date/Time   TSH 0.343 (L) 09/05/2017 0000   TSH 0.60 01/07/2017 0930      OBESITY BEHAVIORAL INTERVENTION VISIT  Today's visit was # 14   Starting weight: 263 lbs Starting date: 09/05/17 Today's weight : 255 lbs  Today's date: 06/21/2018 Total lbs lost to date: 8 At least 15 minutes were spent on discussing the following behavioral intervention visit.   ASK: We discussed the diagnosis of obesity with Jessica Good today and Jessica Good agreed to give [place] permission to discuss obesity behavioral modification therapy today.  ASSESS: Jessica Good has the diagnosis of obesity and her BMI today is 46.63 Jessica Good is in the action stage of change   ADVISE: Jessica Good was educated on the multiple health risks of obesity as well as the benefit of weight loss to improve  her health. She was advised of the need for long term treatment and the importance of lifestyle modifications to improve her current health and to decrease her risk of future health problems.  AGREE: Multiple dietary modification options and treatment options were discussed and  Jessica Good agreed to follow the recommendations documented in the above note.  ARRANGE: Jessica Good was educated on the importance of frequent visits to treat obesity as outlined per CMS and USPSTF guidelines and agreed to schedule her next follow up appointment today.  I, Trixie Dredge, am acting as transcriptionist for Ilene Qua, MD  I have reviewed the above documentation for accuracy and completeness, and I agree with the above. - Ilene Qua, MD

## 2018-06-27 DIAGNOSIS — M810 Age-related osteoporosis without current pathological fracture: Secondary | ICD-10-CM | POA: Diagnosis not present

## 2018-07-06 ENCOUNTER — Ambulatory Visit (INDEPENDENT_AMBULATORY_CARE_PROVIDER_SITE_OTHER): Payer: Medicare Other | Admitting: Family Medicine

## 2018-07-11 ENCOUNTER — Other Ambulatory Visit (INDEPENDENT_AMBULATORY_CARE_PROVIDER_SITE_OTHER): Payer: Self-pay | Admitting: Family Medicine

## 2018-07-11 DIAGNOSIS — I10 Essential (primary) hypertension: Secondary | ICD-10-CM

## 2018-07-27 ENCOUNTER — Ambulatory Visit (INDEPENDENT_AMBULATORY_CARE_PROVIDER_SITE_OTHER): Payer: Medicare Other | Admitting: Family Medicine

## 2018-08-04 DIAGNOSIS — Z79899 Other long term (current) drug therapy: Secondary | ICD-10-CM | POA: Diagnosis not present

## 2018-08-04 DIAGNOSIS — K029 Dental caries, unspecified: Secondary | ICD-10-CM | POA: Diagnosis not present

## 2018-08-08 ENCOUNTER — Encounter (INDEPENDENT_AMBULATORY_CARE_PROVIDER_SITE_OTHER): Payer: Self-pay

## 2018-08-23 ENCOUNTER — Encounter (INDEPENDENT_AMBULATORY_CARE_PROVIDER_SITE_OTHER): Payer: Self-pay | Admitting: Physician Assistant

## 2018-08-23 ENCOUNTER — Other Ambulatory Visit: Payer: Self-pay

## 2018-08-23 ENCOUNTER — Ambulatory Visit (INDEPENDENT_AMBULATORY_CARE_PROVIDER_SITE_OTHER): Payer: Medicare Other | Admitting: Physician Assistant

## 2018-08-23 DIAGNOSIS — E559 Vitamin D deficiency, unspecified: Secondary | ICD-10-CM | POA: Diagnosis not present

## 2018-08-23 DIAGNOSIS — Z6841 Body Mass Index (BMI) 40.0 and over, adult: Secondary | ICD-10-CM

## 2018-08-23 NOTE — Progress Notes (Signed)
Office: [phone]  /  Fax: [phone] TeleHealth Visit:  Jessica Good has verbally consented to this TeleHealth visit today. The patient is located at home, the provider is located at the [facility] office. The participants in this visit include the listed provider and patient. The visit was conducted today via Webex.  HPI:   Chief Complaint: OBESITY Jessica Good is here to discuss her progress with her obesity treatment plan. She is on the Category 2 plan and journaling 250-350 calories + 20 grams of protein at breakfast daily and is following her eating plan approximately 0% of the time. She states she is exercising 0 minutes 0 times per week. Jessica Good is frustrated with the lack of weight loss but is emotionally eating and not following the plan. She has the most trouble with breakfast as she has chronic nausea in the morning. We were unable to weigh the patient today for this TeleHealth visit. She feels as if she has gained 4 lbs since her last visit. She has lost 8 lbs since starting treatment with [place].  Vitamin D deficiency Jessica Good has a diagnosis of Vitamin D deficiency. She is currently taking OTC Vit D and denies vomiting or diarrhea. She does have nausea in the a.m. not related to Vitamin D.  ASSESSMENT AND PLAN:  Vitamin D deficiency  Class 3 severe obesity with serious comorbidity and body mass index (BMI) of 45.0 to 49.9 in adult, unspecified obesity type ([name])  PLAN:  Vitamin D Deficiency Jessica Good was informed that low Vitamin D levels contributes to fatigue and are associated with obesity, breast, and colon cancer. She agrees to continue taking OTC Vit D and will follow-up for routine testing of Vitamin D, at least 2-3 times per year. She was informed of the risk of over-replacement of Vitamin D and agrees to not increase her dose unless she discusses this with [place] first. Jessica Good agrees to follow-up with our clinic in 2 weeks.  Obesity Jessica Good is currently in the  action stage of change. As such, her goal is to continue with weight loss efforts. She has agreed to follow the Category 2 plan. Jessica Good has been instructed to work up to a goal of 150 minutes of combined cardio and strengthening exercise per week for weight loss and overall health benefits. We discussed the following Behavioral Modification Strategies today: no skipping meals, work on meal planning and easy cooking plans.  Jessica Good has agreed to follow-up with our clinic in 2 weeks. She was informed of the importance of frequent follow-up visits to maximize her success with intensive lifestyle modifications for her multiple health conditions.  ALLERGIES: Allergies  Allergen Reactions   Demerol [Meperidine] Other (See Comments)    caused excessive sleepiness   Latex Rash    Pt. Stated she has a " latex allergy" and it "gives her a rash".   Sulfa Antibiotics Other (See Comments)    : Flu-like symptoms   Adhesive [Tape] Rash    Reaction to bandaids    MEDICATIONS: Current Outpatient Medications on File Prior to Visit  Medication Sig Dispense Refill   acetaminophen (TYLENOL) 500 MG tablet Take 500 mg by mouth every 6 (six) hours as needed for moderate pain.     aspirin 81 MG tablet Take 81 mg by mouth daily.     baclofen (LIORESAL) 10 MG tablet Take 1 tablet by mouth daily.     cholecalciferol (VITAMIN D) 1000 UNITS tablet Take 2,000 Units by mouth daily.  citalopram (CELEXA) 10 MG tablet Take 10 mg by mouth daily.     furosemide (LASIX) 40 MG tablet Take 40 mg by mouth daily as needed for fluid.      ibuprofen (ADVIL,MOTRIN) 800 MG tablet Take 800 mg by mouth every 8 (eight) hours as needed.     lisinopril (PRINIVIL,ZESTRIL) 20 MG tablet Take 1 tablet (20 mg total) by mouth 2 (two) times daily. 60 tablet 0   Multiple Vitamin (MULTIVITAMIN WITH MINERALS) TABS Take 1 tablet by mouth daily.     sucralfate (CARAFATE) 1 g tablet Take 1 g by mouth 3 (three) times daily as  needed (ulcer).      Teriparatide, Recombinant, (FORTEO) 600 MCG/2.4ML SOLN Inject 0.08 mLs (20 mcg total) into the skin daily. 2.24 mL 0   thiamine (VITAMIN B-1) 100 MG tablet Take 100 mg by mouth daily.     vitamin B-12 (CYANOCOBALAMIN) 500 MCG tablet Take 500 mcg by mouth daily.     vitamin C (ASCORBIC ACID) 500 MG tablet Take 500 mg by mouth daily.     No current facility-administered medications on file prior to visit.     PAST MEDICAL HISTORY: Past Medical History:  Diagnosis Date   Adenomatous colon polyp    Anemia yrs ago   PMH   Anxiety    Cataract    Constipation due to pain medication    Edema of lower extremity    Takes lasix PRN   Gallstones    Gastritis    GERD (gastroesophageal reflux disease)    with meds   Hiatal hernia    History of recurrent UTIs    Hyperlipidemia    Hypertension    IBS (irritable bowel syndrome)    Interstitial cystitis    Loose, teeth    in the front of mouth   Osteoarthritis    Osteoporosis    osteoarthritis   Pre-diabetes    Pseudogout    Urgency of urination     PAST SURGICAL HISTORY: Past Surgical History:  Procedure Laterality Date   ANKLE SURGERY Right    BREAST LUMPECTOMY Left 1996   fibroid   COLONOSCOPY W/ POLYPECTOMY     COLONOSCOPY WITH PROPOFOL N/A 09/02/2016   Procedure: COLONOSCOPY WITH PROPOFOL;  Surgeon: Milus Banister, MD;  Location: [facility];  Service: Endoscopy;  Laterality: N/A;   EUS N/A 02/06/2015   Procedure: UPPER ENDOSCOPIC ULTRASOUND (EUS) LINEAR;  Surgeon: Milus Banister, MD;  Location[facility];  Service: Endoscopy;  Laterality: N/A;   EUS N/A 09/02/2016   Procedure: UPPER ENDOSCOPIC ULTRASOUND (EUS) LINEAR;  Surgeon: Milus Banister, MD;  [facility];  Service: Endoscopy;  Laterality: N/A;   EYE SURGERY Bilateral    lasix 14 yrs ago   JOINT REPLACEMENT     PARTIAL KNEE ARTHROPLASTY Left 11/26/2014   Procedure: LEFT UNICOMPARTMENTAL KNEE;   Surgeon: Renette Butters, MD;  Location: [place];  Service: Orthopedics;  Laterality: Left;   TOTAL KNEE ARTHROPLASTY Right 04/09/2014   DR MURPHY   TOTAL KNEE ARTHROPLASTY Right 04/09/2014   Procedure: TOTAL KNEE ARTHROPLASTY;  Surgeon: Renette Butters, MD;  Location: [place];  Service: Orthopedics;  Laterality: Right;   TOTAL KNEE REVISION Left 11/03/2015   Procedure: TOTAL KNEE REVISION;  Surgeon: Renette Butters, MD;  Location: [place];  Service: Orthopedics;  Laterality: Left;    SOCIAL HISTORY: Social History   Tobacco Use   Smoking status: Never Smoker   Smokeless tobacco: Never Used  Substance Use Topics   Alcohol use: No    Alcohol/week: 0.0 standard drinks   Drug use: No    FAMILY HISTORY: Family History  Problem Relation Age of Onset   Pancreatic cancer Mother        head of pancreas removed    Diabetes Mother    Hypertension Mother    Diabetes Father    Heart disease Father    Hypertension Father    Diabetes Maternal Grandmother    Breast cancer Neg Hx    Celiac disease Neg Hx    Cirrhosis Neg Hx    Clotting disorder Neg Hx    Colitis Neg Hx    Colon cancer Neg Hx    Colon polyps Neg Hx    Crohn's disease Neg Hx    Cystic fibrosis Neg Hx    Esophageal cancer Neg Hx    Hemochromatosis Neg Hx    Inflammatory bowel disease Neg Hx    Irritable bowel syndrome Neg Hx    Kidney disease Neg Hx    Liver cancer Neg Hx    Liver disease Neg Hx    Ovarian cancer Neg Hx    Prostate cancer Neg Hx    Rectal cancer Neg Hx    Stomach cancer Neg Hx    Ulcerative colitis Neg Hx    Uterine cancer Neg Hx    Wilson's disease Neg Hx    Goiter Neg Hx    ROS: Review of Systems  Gastrointestinal: Positive for nausea (in the a.m.). Negative for diarrhea and vomiting.   PHYSICAL EXAM: Pt in no acute distress  RECENT LABS AND TESTS: BMET    Component Value Date/Time   NA 141 03/23/2018 1049   K 4.0 03/23/2018 1049   CL 101  03/23/2018 1049   CO2 24 03/23/2018 1049   GLUCOSE 133 (H) 03/23/2018 1049   GLUCOSE 156 (H) 05/04/2017 0034   BUN 10 03/23/2018 1049   CREATININE 0.66 03/23/2018 1049   CREATININE 0.65 01/22/2016 1130   CALCIUM 8.9 03/23/2018 1049   GFRNONAA 91 03/23/2018 1049   GFRAA 105 03/23/2018 1049   Lab Results  Component Value Date   HGBA1C 6.2 (H) 03/23/2018   HGBA1C 5.9 (A) 01/06/2018   HGBA1C 7.1 (H) 09/05/2017   HGBA1C 6.7 (H) 10/27/2015   HGBA1C 6.3 (H) 11/18/2014   Lab Results  Component Value Date   INSULIN 8.0 03/23/2018   INSULIN 10.7 09/05/2017   CBC    Component Value Date/Time   WBC 4.0 09/05/2017 0000   WBC 6.0 05/04/2017 0034   RBC 4.49 09/05/2017 0000   RBC 4.46 05/04/2017 0034   HGB 13.7 09/05/2017 0000   HCT 41.8 09/05/2017 0000   PLT 210 05/04/2017 0034   MCV 93 09/05/2017 0000   MCH 30.5 09/05/2017 0000   MCH 30.0 05/04/2017 0034   MCHC 32.8 09/05/2017 0000   MCHC 32.8 05/04/2017 0034   RDW 13.2 09/05/2017 0000   LYMPHSABS 1.4 09/05/2017 0000   MONOABS 0.4 08/05/2016 1618   EOSABS 0.1 09/05/2017 0000   BASOSABS 0.0 09/05/2017 0000   Iron/TIBC/Ferritin/ %Sat No results found for: IRON, TIBC, FERRITIN, IRONPCTSAT Lipid Panel     Component Value Date/Time   CHOL 182 03/23/2018 1049   TRIG 123 03/23/2018 1049   HDL 53 03/23/2018 1049   LDLCALC 104 (H) 03/23/2018 1049   Hepatic Function Panel     Component Value Date/Time   PROT 6.4 03/23/2018 1049   ALBUMIN 4.2 03/23/2018 1049  AST 14 03/23/2018 1049   ALT 8 03/23/2018 1049   ALKPHOS 98 03/23/2018 1049   BILITOT 0.5 03/23/2018 1049      Component Value Date/Time   TSH 0.343 (L) 09/05/2017 0000   TSH 0.60 01/07/2017 0930   Results for RAKHI, ROMAGNOLI (MRN [id number]) as of 08/23/2018 15:40  Ref. Range 03/23/2018 10:49  Vitamin D, 25-Hydroxy Latest Ref Range: 30.0 - 100.0 ng/mL 46.6   I, Michaelene Song, am acting as Location manager for [employer], PA-C I, Abby Potash, PA-C have  reviewed above note and agree with its content

## 2018-09-07 ENCOUNTER — Ambulatory Visit (INDEPENDENT_AMBULATORY_CARE_PROVIDER_SITE_OTHER): Payer: Medicare Other | Admitting: Physician Assistant

## 2018-09-07 ENCOUNTER — Other Ambulatory Visit: Payer: Self-pay

## 2018-09-07 ENCOUNTER — Encounter (INDEPENDENT_AMBULATORY_CARE_PROVIDER_SITE_OTHER): Payer: Self-pay | Admitting: Physician Assistant

## 2018-09-07 DIAGNOSIS — Z6841 Body Mass Index (BMI) 40.0 and over, adult: Secondary | ICD-10-CM | POA: Diagnosis not present

## 2018-09-07 DIAGNOSIS — E559 Vitamin D deficiency, unspecified: Secondary | ICD-10-CM | POA: Diagnosis not present

## 2018-09-07 NOTE — Progress Notes (Signed)
Office: [phone]  /  Fax: [phone] TeleHealth Visit:  Jessica Good has verbally consented to this TeleHealth visit today. The patient is located at home, the provider is located at the [facility] office. The participants in this visit include the listed provider and patient. The visit was conducted today via Webex.  HPI:   Chief Complaint: OBESITY Jessica Good is here to discuss her progress with her obesity treatment plan. She is on the Category 2 plan and is following her eating plan approximately 0% of the time. She states she is exercising 0 minutes 0 times per week. Jessica Good reports that she has an abscessed tooth and cannot chew. She is having a difficult time getting in her protein for that reason. We were unable to weigh the patient today for this TeleHealth visit. She states her weight was 260 lbs this morning. She has lost 8 lbs since starting treatment with [place].  Vitamin D deficiency Jessica Good has a diagnosis of Vitamin D deficiency. She is currently taking OTC Vit D and denies nausea, vomiting or muscle weakness.  ASSESSMENT AND PLAN:  Vitamin D deficiency  Class 3 severe obesity with serious comorbidity and body mass index (BMI) of 45.0 to 49.9 in adult, unspecified obesity type ([place])  PLAN:  Vitamin D Deficiency [name] was informed that low Vitamin D levels contributes to fatigue and are associated with obesity, breast, and colon cancer. She agrees to continue taking OTC Vit D and will follow-up for routine testing of Vitamin D, at least 2-3 times per year. She was informed of the risk of over-replacement of Vitamin D and agrees to not increase her dose unless she discusses this with [place] first. Jessica Good agrees to follow-up with our clinic in 3 weeks.  Obesity Jessica Good is currently in the action stage of change. As such, her goal is to continue with weight loss efforts. She has agreed to follow the Category 2 plan. Soft meat options were given. Jessica Good has been  instructed to work up to a goal of 150 minutes of combined cardio and strengthening exercise per week for weight loss and overall health benefits. We discussed the following Behavioral Modification Strategies today: work on meal planning, easy cooking plans, and keeping healthy foods in the home.  Jessica Good has agreed to follow-up with our clinic in 3 weeks. She was informed of the importance of frequent follow-up visits to maximize her success with intensive lifestyle modifications for her multiple health conditions.  ALLERGIES: Allergies  Allergen Reactions   Demerol [Meperidine] Other (See Comments)    caused excessive sleepiness   Latex Rash    Pt. Stated she has a " latex allergy" and it "gives her a rash".   Sulfa Antibiotics Other (See Comments)    : Flu-like symptoms   Adhesive [Tape] Rash    Reaction to bandaids    MEDICATIONS: Current Outpatient Medications on File Prior to Visit  Medication Sig Dispense Refill   acetaminophen (TYLENOL) 500 MG tablet Take 500 mg by mouth every 6 (six) hours as needed for moderate pain.     aspirin 81 MG tablet Take 81 mg by mouth daily.     baclofen (LIORESAL) 10 MG tablet Take 1 tablet by mouth daily.     cholecalciferol (VITAMIN D) 1000 UNITS tablet Take 2,000 Units by mouth daily.      citalopram (CELEXA) 10 MG tablet Take 10 mg by mouth daily.     furosemide (LASIX) 40 MG tablet Take 40 mg by mouth  daily as needed for fluid.      ibuprofen (ADVIL,MOTRIN) 800 MG tablet Take 800 mg by mouth every 8 (eight) hours as needed.     lisinopril (PRINIVIL,ZESTRIL) 20 MG tablet Take 1 tablet (20 mg total) by mouth 2 (two) times daily. 60 tablet 0   Multiple Vitamin (MULTIVITAMIN WITH MINERALS) TABS Take 1 tablet by mouth daily.     sucralfate (CARAFATE) 1 g tablet Take 1 g by mouth 3 (three) times daily as needed (ulcer).      Teriparatide, Recombinant, (FORTEO) 600 MCG/2.4ML SOLN Inject 0.08 mLs (20 mcg total) into the skin daily. 2.24  mL 0   thiamine (VITAMIN B-1) 100 MG tablet Take 100 mg by mouth daily.     vitamin B-12 (CYANOCOBALAMIN) 500 MCG tablet Take 500 mcg by mouth daily.     vitamin C (ASCORBIC ACID) 500 MG tablet Take 500 mg by mouth daily.     No current facility-administered medications on file prior to visit.     PAST MEDICAL HISTORY: Past Medical History:  Diagnosis Date   Adenomatous colon polyp    Anemia yrs ago   PMH   Anxiety    Cataract    Constipation due to pain medication    Edema of lower extremity    Takes lasix PRN   Gallstones    Gastritis    GERD (gastroesophageal reflux disease)    with meds   Hiatal hernia    History of recurrent UTIs    Hyperlipidemia    Hypertension    IBS (irritable bowel syndrome)    Interstitial cystitis    Loose, teeth    in the front of mouth   Osteoarthritis    Osteoporosis    osteoarthritis   Pre-diabetes    Pseudogout    Urgency of urination     PAST SURGICAL HISTORY: Past Surgical History:  Procedure Laterality Date   ANKLE SURGERY Right    BREAST LUMPECTOMY Left 1996   fibroid   COLONOSCOPY W/ POLYPECTOMY     COLONOSCOPY WITH PROPOFOL N/A 09/02/2016   Procedure: COLONOSCOPY WITH PROPOFOL;  Surgeon: Milus Banister, MD;  Location: [facility];  Service: Endoscopy;  Laterality: N/A;   EUS N/A 02/06/2015   Procedure: UPPER ENDOSCOPIC ULTRASOUND (EUS) LINEAR;  Surgeon: Milus Banister, MD;  Location: [facility];  Service: Endoscopy;  Laterality: N/A;   EUS N/A 09/02/2016   Procedure: UPPER ENDOSCOPIC ULTRASOUND (EUS) LINEAR;  Surgeon: Milus Banister, MD;  Location: [facility];  Service: Endoscopy;  Laterality: N/A;   EYE SURGERY Bilateral    lasix 14 yrs ago   JOINT REPLACEMENT     PARTIAL KNEE ARTHROPLASTY Left 11/26/2014   Procedure: LEFT UNICOMPARTMENTAL KNEE;  Surgeon: Renette Butters, MD;  Location: [place];  Service: Orthopedics;  Laterality: Left;   TOTAL KNEE ARTHROPLASTY Right 04/09/2014    DR MURPHY   TOTAL KNEE ARTHROPLASTY Right 04/09/2014   Procedure: TOTAL KNEE ARTHROPLASTY;  Surgeon: Renette Butters, MD;  Location: [place];  Service: Orthopedics;  Laterality: Right;   TOTAL KNEE REVISION Left 11/03/2015   Procedure: TOTAL KNEE REVISION;  Surgeon: Renette Butters, MD;  Location: [place];  Service: Orthopedics;  Laterality: Left;    SOCIAL HISTORY: Social History   Tobacco Use   Smoking status: Never Smoker   Smokeless tobacco: Never Used  Substance Use Topics   Alcohol use: No    Alcohol/week: 0.0 standard drinks   Drug use: No    FAMILY  HISTORY: Family History  Problem Relation Age of Onset   Pancreatic cancer Mother        head of pancreas removed    Diabetes Mother    Hypertension Mother    Diabetes Father    Heart disease Father    Hypertension Father    Diabetes Maternal Grandmother    Breast cancer Neg Hx    Celiac disease Neg Hx    Cirrhosis Neg Hx    Clotting disorder Neg Hx    Colitis Neg Hx    Colon cancer Neg Hx    Colon polyps Neg Hx    Crohn's disease Neg Hx    Cystic fibrosis Neg Hx    Esophageal cancer Neg Hx    Hemochromatosis Neg Hx    Inflammatory bowel disease Neg Hx    Irritable bowel syndrome Neg Hx    Kidney disease Neg Hx    Liver cancer Neg Hx    Liver disease Neg Hx    Ovarian cancer Neg Hx    Prostate cancer Neg Hx    Rectal cancer Neg Hx    Stomach cancer Neg Hx    Ulcerative colitis Neg Hx    Uterine cancer Neg Hx    Wilson's disease Neg Hx    Goiter Neg Hx    ROS: Review of Systems  Gastrointestinal: Negative for nausea and vomiting.  Musculoskeletal:       Negative for muscle weakness.   PHYSICAL EXAM: Pt in no acute distress  RECENT LABS AND TESTS: BMET    Component Value Date/Time   NA 141 03/23/2018 1049   K 4.0 03/23/2018 1049   CL 101 03/23/2018 1049   CO2 24 03/23/2018 1049   GLUCOSE 133 (H) 03/23/2018 1049   GLUCOSE 156 (H) 05/04/2017 0034   BUN 10  03/23/2018 1049   CREATININE 0.66 03/23/2018 1049   CREATININE 0.65 01/22/2016 1130   CALCIUM 8.9 03/23/2018 1049   GFRNONAA 91 03/23/2018 1049   GFRAA 105 03/23/2018 1049   Lab Results  Component Value Date   HGBA1C 6.2 (H) 03/23/2018   HGBA1C 5.9 (A) 01/06/2018   HGBA1C 7.1 (H) 09/05/2017   HGBA1C 6.7 (H) 10/27/2015   HGBA1C 6.3 (H) 11/18/2014   Lab Results  Component Value Date   INSULIN 8.0 03/23/2018   INSULIN 10.7 09/05/2017   CBC    Component Value Date/Time   WBC 4.0 09/05/2017 0000   WBC 6.0 05/04/2017 0034   RBC 4.49 09/05/2017 0000   RBC 4.46 05/04/2017 0034   HGB 13.7 09/05/2017 0000   HCT 41.8 09/05/2017 0000   PLT 210 05/04/2017 0034   MCV 93 09/05/2017 0000   MCH 30.5 09/05/2017 0000   MCH 30.0 05/04/2017 0034   MCHC 32.8 09/05/2017 0000   MCHC 32.8 05/04/2017 0034   RDW 13.2 09/05/2017 0000   LYMPHSABS 1.4 09/05/2017 0000   MONOABS 0.4 08/05/2016 1618   EOSABS 0.1 09/05/2017 0000   BASOSABS 0.0 09/05/2017 0000   Iron/TIBC/Ferritin/ %Sat No results found for: IRON, TIBC, FERRITIN, IRONPCTSAT Lipid Panel     Component Value Date/Time   CHOL 182 03/23/2018 1049   TRIG 123 03/23/2018 1049   HDL 53 03/23/2018 1049   LDLCALC 104 (H) 03/23/2018 1049   Hepatic Function Panel     Component Value Date/Time   PROT 6.4 03/23/2018 1049   ALBUMIN 4.2 03/23/2018 1049   AST 14 03/23/2018 1049   ALT 8 03/23/2018 1049   ALKPHOS 98 03/23/2018 1049  BILITOT 0.5 03/23/2018 1049      Component Value Date/Time   TSH 0.343 (L) 09/05/2017 0000   TSH 0.60 01/07/2017 0930   Results for JAQUELYN, SAKAMOTO (MRN [id number]) as of 09/07/2018 15:56  Ref. Range 03/23/2018 10:49  Vitamin D, 25-Hydroxy Latest Ref Range: 30.0 - 100.0 ng/mL 46.6   ,I, Michaelene Song, am acting as Location manager for [employer], PA-C I, Abby Potash, PA-C have reviewed above note and agree with its content

## 2018-09-26 ENCOUNTER — Other Ambulatory Visit: Payer: Self-pay

## 2018-09-26 ENCOUNTER — Ambulatory Visit (INDEPENDENT_AMBULATORY_CARE_PROVIDER_SITE_OTHER): Payer: Medicare Other | Admitting: Physician Assistant

## 2018-09-26 ENCOUNTER — Encounter (INDEPENDENT_AMBULATORY_CARE_PROVIDER_SITE_OTHER): Payer: Self-pay | Admitting: Physician Assistant

## 2018-09-26 DIAGNOSIS — Z6841 Body Mass Index (BMI) 40.0 and over, adult: Secondary | ICD-10-CM

## 2018-09-26 DIAGNOSIS — E559 Vitamin D deficiency, unspecified: Secondary | ICD-10-CM

## 2018-09-26 NOTE — Progress Notes (Signed)
Office: [phone]  /  Fax: [phone] TeleHealth Visit:  Jessica Good has verbally consented to this TeleHealth visit today. The patient is located at home, the provider is located at the [facility] office. The participants in this visit include the listed provider and patient. The visit was conducted today via audio.  HPI:   Chief Complaint: OBESITY Jessica Good is here to discuss her progress with her obesity treatment plan. She is on the Category 2 plan and is following her eating plan approximately less than 50% of the time. She states she is exercising 0 minutes 0 times per week. Marne reports that she just got all of her teeth pulled to get dentures and has had a reaction to the adhesive. She has a lot of sores in her mouth and can't chew anything. She has been eating ice cream, mashed potatoes, applesauce, pudding, and other soft foods.  We were unable to weigh the patient today for this TeleHealth visit. She feels as if she has lost 7 lbs since her last visit. She has lost 8 lbs since starting treatment with [place].  Vitamin D deficiency Jessica Good has a diagnosis of Vitamin D deficiency. She is currently taking OTC Vit D and denies nausea, vomiting, diarrhea, or muscle weakness.  ASSESSMENT AND PLAN:  Vitamin D deficiency  Class 3 severe obesity with serious comorbidity and body mass index (BMI) of 40.0 to 44.9 in adult, unspecified obesity type ([place])  PLAN:  Vitamin D Deficiency Jessica Good was informed that low Vitamin D levels contributes to fatigue and are associated with obesity, breast, and colon cancer. She agrees to continue taking OTC Vit D and will follow-up for routine testing of Vitamin D, at least 2-3 times per year. She was informed of the risk of over-replacement of Vitamin D and agrees to not increase her dose unless she discusses this with [place] first. Jessica Good agrees to follow-up with our clinic in 2 weeks.  Obesity Jessica Good is currently in the action stage of  change. As such, her goal is to continue with weight loss efforts. She has agreed to follow the Category 2 plan. Jessica Good has been instructed to work up to a goal of 150 minutes of combined cardio and strengthening exercise per week for weight loss and overall health benefits. We discussed the following Behavioral Modification Strategies today: increasing lean protein intake, work on meal planning and easy cooking plans.  Jessica Good has agreed to follow-up with our clinic in 2 weeks. She was informed of the importance of frequent follow-up visits to maximize her success with intensive lifestyle modifications for her multiple health conditions.  ALLERGIES: Allergies  Allergen Reactions   Demerol [Meperidine] Other (See Comments)    caused excessive sleepiness   Latex Rash    Pt. Stated she has a " latex allergy" and it "gives her a rash".   Sulfa Antibiotics Other (See Comments)    : Flu-like symptoms   Adhesive [Tape] Rash    Reaction to bandaids    MEDICATIONS: Current Outpatient Medications on File Prior to Visit  Medication Sig Dispense Refill   acetaminophen (TYLENOL) 500 MG tablet Take 500 mg by mouth every 6 (six) hours as needed for moderate pain.     aspirin 81 MG tablet Take 81 mg by mouth daily.     baclofen (LIORESAL) 10 MG tablet Take 1 tablet by mouth daily.     cholecalciferol (VITAMIN D) 1000 UNITS tablet Take 2,000 Units by mouth daily.  citalopram (CELEXA) 10 MG tablet Take 10 mg by mouth daily.     furosemide (LASIX) 40 MG tablet Take 40 mg by mouth daily as needed for fluid.      ibuprofen (ADVIL,MOTRIN) 800 MG tablet Take 800 mg by mouth every 8 (eight) hours as needed.     lisinopril (PRINIVIL,ZESTRIL) 20 MG tablet Take 1 tablet (20 mg total) by mouth 2 (two) times daily. 60 tablet 0   Multiple Vitamin (MULTIVITAMIN WITH MINERALS) TABS Take 1 tablet by mouth daily.     sucralfate (CARAFATE) 1 g tablet Take 1 g by mouth 3 (three) times daily as needed  (ulcer).      Teriparatide, Recombinant, (FORTEO) 600 MCG/2.4ML SOLN Inject 0.08 mLs (20 mcg total) into the skin daily. 2.24 mL 0   thiamine (VITAMIN B-1) 100 MG tablet Take 100 mg by mouth daily.     vitamin B-12 (CYANOCOBALAMIN) 500 MCG tablet Take 500 mcg by mouth daily.     vitamin C (ASCORBIC ACID) 500 MG tablet Take 500 mg by mouth daily.     No current facility-administered medications on file prior to visit.     PAST MEDICAL HISTORY: Past Medical History:  Diagnosis Date   Adenomatous colon polyp    Anemia yrs ago   PMH   Anxiety    Cataract    Constipation due to pain medication    Edema of lower extremity    Takes lasix PRN   Gallstones    Gastritis    GERD (gastroesophageal reflux disease)    with meds   Hiatal hernia    History of recurrent UTIs    Hyperlipidemia    Hypertension    IBS (irritable bowel syndrome)    Interstitial cystitis    Loose, teeth    in the front of mouth   Osteoarthritis    Osteoporosis    osteoarthritis   Pre-diabetes    Pseudogout    Urgency of urination     PAST SURGICAL HISTORY: Past Surgical History:  Procedure Laterality Date   ANKLE SURGERY Right    BREAST LUMPECTOMY Left 1996   fibroid   COLONOSCOPY W/ POLYPECTOMY     COLONOSCOPY WITH PROPOFOL N/A 09/02/2016   Procedure: COLONOSCOPY WITH PROPOFOL;  Surgeon: Milus Banister, MD;  Location: [facility];  Service: Endoscopy;  Laterality: N/A;   EUS N/A 02/06/2015   Procedure: UPPER ENDOSCOPIC ULTRASOUND (EUS) LINEAR;  Surgeon: Milus Banister, MD;  Location: [facility];  Service: Endoscopy;  Laterality: N/A;   EUS N/A 09/02/2016   Procedure: UPPER ENDOSCOPIC ULTRASOUND (EUS) LINEAR;  Surgeon: Milus Banister, MD;  Location[facility];  Service: Endoscopy;  Laterality: N/A;   EYE SURGERY Bilateral    lasix 14 yrs ago   JOINT REPLACEMENT     PARTIAL KNEE ARTHROPLASTY Left 11/26/2014   Procedure: LEFT UNICOMPARTMENTAL KNEE;  Surgeon:  Renette Butters, MD;  Location: [place];  Service: Orthopedics;  Laterality: Left;   TOTAL KNEE ARTHROPLASTY Right 04/09/2014   DR MURPHY   TOTAL KNEE ARTHROPLASTY Right 04/09/2014   Procedure: TOTAL KNEE ARTHROPLASTY;  Surgeon: Renette Butters, MD;  Location: [place];  Service: Orthopedics;  Laterality: Right;   TOTAL KNEE REVISION Left 11/03/2015   Procedure: TOTAL KNEE REVISION;  Surgeon: Renette Butters, MD;  Location: [place];  Service: Orthopedics;  Laterality: Left;    SOCIAL HISTORY: Social History   Tobacco Use   Smoking status: Never Smoker   Smokeless tobacco: Never Used  Substance Use Topics   Alcohol use: No    Alcohol/week: 0.0 standard drinks   Drug use: No    FAMILY HISTORY: Family History  Problem Relation Age of Onset   Pancreatic cancer Mother        head of pancreas removed    Diabetes Mother    Hypertension Mother    Diabetes Father    Heart disease Father    Hypertension Father    Diabetes Maternal Grandmother    Breast cancer Neg Hx    Celiac disease Neg Hx    Cirrhosis Neg Hx    Clotting disorder Neg Hx    Colitis Neg Hx    Colon cancer Neg Hx    Colon polyps Neg Hx    Crohn's disease Neg Hx    Cystic fibrosis Neg Hx    Esophageal cancer Neg Hx    Hemochromatosis Neg Hx    Inflammatory bowel disease Neg Hx    Irritable bowel syndrome Neg Hx    Kidney disease Neg Hx    Liver cancer Neg Hx    Liver disease Neg Hx    Ovarian cancer Neg Hx    Prostate cancer Neg Hx    Rectal cancer Neg Hx    Stomach cancer Neg Hx    Ulcerative colitis Neg Hx    Uterine cancer Neg Hx    Wilson's disease Neg Hx    Goiter Neg Hx    ROS: Review of Systems  Gastrointestinal: Negative for diarrhea, nausea and vomiting.  Musculoskeletal:       Negative for muscle weakness.   PHYSICAL EXAM: Pt in no acute distress  RECENT LABS AND TESTS: BMET    Component Value Date/Time   NA 141 03/23/2018 1049   K 4.0 03/23/2018 1049    CL 101 03/23/2018 1049   CO2 24 03/23/2018 1049   GLUCOSE 133 (H) 03/23/2018 1049   GLUCOSE 156 (H) 05/04/2017 0034   BUN 10 03/23/2018 1049   CREATININE 0.66 03/23/2018 1049   CREATININE 0.65 01/22/2016 1130   CALCIUM 8.9 03/23/2018 1049   GFRNONAA 91 03/23/2018 1049   GFRAA 105 03/23/2018 1049   Lab Results  Component Value Date   HGBA1C 6.2 (H) 03/23/2018   HGBA1C 5.9 (A) 01/06/2018   HGBA1C 7.1 (H) 09/05/2017   HGBA1C 6.7 (H) 10/27/2015   HGBA1C 6.3 (H) 11/18/2014   Lab Results  Component Value Date   INSULIN 8.0 03/23/2018   INSULIN 10.7 09/05/2017   CBC    Component Value Date/Time   WBC 4.0 09/05/2017 0000   WBC 6.0 05/04/2017 0034   RBC 4.49 09/05/2017 0000   RBC 4.46 05/04/2017 0034   HGB 13.7 09/05/2017 0000   HCT 41.8 09/05/2017 0000   PLT 210 05/04/2017 0034   MCV 93 09/05/2017 0000   MCH 30.5 09/05/2017 0000   MCH 30.0 05/04/2017 0034   MCHC 32.8 09/05/2017 0000   MCHC 32.8 05/04/2017 0034   RDW 13.2 09/05/2017 0000   LYMPHSABS 1.4 09/05/2017 0000   MONOABS 0.4 08/05/2016 1618   EOSABS 0.1 09/05/2017 0000   BASOSABS 0.0 09/05/2017 0000   Iron/TIBC/Ferritin/ %Sat No results found for: IRON, TIBC, FERRITIN, IRONPCTSAT Lipid Panel     Component Value Date/Time   CHOL 182 03/23/2018 1049   TRIG 123 03/23/2018 1049   HDL 53 03/23/2018 1049   LDLCALC 104 (H) 03/23/2018 1049   Hepatic Function Panel     Component Value Date/Time   PROT 6.4 03/23/2018  1049   ALBUMIN 4.2 03/23/2018 1049   AST 14 03/23/2018 1049   ALT 8 03/23/2018 1049   ALKPHOS 98 03/23/2018 1049   BILITOT 0.5 03/23/2018 1049      Component Value Date/Time   TSH 0.343 (L) 09/05/2017 0000   TSH 0.60 01/07/2017 0930   Results for ZAILEE, VALLELY (MRN [id number]) as of 09/26/2018 12:39  Ref. Range 03/23/2018 10:49  Vitamin D, 25-Hydroxy Latest Ref Range: 30.0 - 100.0 ng/mL 46.6    I, Michaelene Song, am acting as Location manager for [employer], PA-C I, Abby Potash,  PA-C have reviewed above note and agree with its content

## 2018-10-10 ENCOUNTER — Other Ambulatory Visit: Payer: Self-pay

## 2018-10-10 ENCOUNTER — Ambulatory Visit (INDEPENDENT_AMBULATORY_CARE_PROVIDER_SITE_OTHER): Payer: Medicare Other | Admitting: Physician Assistant

## 2018-10-10 ENCOUNTER — Encounter (INDEPENDENT_AMBULATORY_CARE_PROVIDER_SITE_OTHER): Payer: Self-pay | Admitting: Physician Assistant

## 2018-10-10 DIAGNOSIS — Z6841 Body Mass Index (BMI) 40.0 and over, adult: Secondary | ICD-10-CM

## 2018-10-10 DIAGNOSIS — E1165 Type 2 diabetes mellitus with hyperglycemia: Secondary | ICD-10-CM | POA: Diagnosis not present

## 2018-10-10 NOTE — Progress Notes (Signed)
Office: [phone]  /  Fax: [phone] TeleHealth Visit:  Jessica Good has verbally consented to this TeleHealth visit today. The patient is located at home, the provider is located at the [facility] office. The participants in this visit include the listed provider and patient. The visit was conducted today via webex.  HPI:   Chief Complaint: OBESITY Jessica Good is here to discuss her progress with her obesity treatment plan. She is on the Category 2 plan and is following her eating plan approximately 10 % of the time. She states she is exercising 0 minutes 0 times per week. Jessica Good's most recent weight is 262 lbs. She states she had all of her teeth removed. She is unable to eat solid foods due to not being ble to wear her dentures currently. She is having to drink protein shakes and eat soft foods. She states her blood pressure is 126/80. We were unable to weigh the patient today for this TeleHealth visit. She is unsure if she has lost or gained weight since her last visit. She has lost 8 lbs since starting treatment with [place].  Diabetes II with Hyperglycemia Jessica Good has a diagnosis of diabetes type II. Malie states she is not checking her blood sugars every day due to not being able to eat much. She states her average BGs after she eats is 156. She denies hypoglycemia. Last A1c was 6.2. She has been working on intensive lifestyle modifications including diet, exercise, and weight loss to help control her blood glucose levels.  ASSESSMENT AND PLAN:  Type 2 diabetes mellitus with hyperglycemia, without long-term current use of insulin (HCC)  Class 3 severe obesity with serious comorbidity and body mass index (BMI) of 45.0 to 49.9 in adult, unspecified obesity type ([place])  PLAN:  Diabetes II with Hyperglycemia Jessica Good has been given extensive diabetes education by myself today including ideal fasting and post-prandial blood glucose readings, individual ideal Hgb A1c goals  and hypoglycemia prevention. We discussed the importance of good blood sugar control to decrease the likelihood of diabetic complications such as nephropathy, neuropathy, limb loss, blindness, coronary artery disease, and death. We discussed the importance of intensive lifestyle modification including diet, exercise and weight loss as the first line treatment for diabetes. Jessica Good agrees to continue with weight loss, and she agrees to follow up with our clinic in 3 weeks.  Obesity Jessica Good is currently in the action stage of change. As such, her goal is to continue with weight loss efforts She has agreed to follow the Category 2 plan Jessica Good has been instructed to work up to a goal of 150 minutes of combined cardio and strengthening exercise per week for weight loss and overall health benefits. We discussed the following Behavioral Modification Strategies today: increasing lean protein intake and work on meal planning and easy cooking plans   Jessica Good has agreed to follow up with our clinic in 3 weeks. She was informed of the importance of frequent follow up visits to maximize her success with intensive lifestyle modifications for her multiple health conditions.  ALLERGIES: Allergies  Allergen Reactions  . Demerol [Meperidine] Other (See Comments)    caused excessive sleepiness  . Latex Rash    Pt. Stated she has a " latex allergy" and it "gives her a rash".  . Sulfa Antibiotics Other (See Comments)    : Flu-like symptoms  . Adhesive [Tape] Rash    Reaction to bandaids    MEDICATIONS: Current Outpatient Medications on File Prior to Visit  Medication Sig Dispense Refill  . acetaminophen (TYLENOL) 500 MG tablet Take 500 mg by mouth every 6 (six) hours as needed for moderate pain.    Marland Kitchen aspirin 81 MG tablet Take 81 mg by mouth daily.    . baclofen (LIORESAL) 10 MG tablet Take 1 tablet by mouth daily.    . cholecalciferol (VITAMIN D) 1000 UNITS tablet Take 2,000 Units by mouth daily.     .  citalopram (CELEXA) 10 MG tablet Take 10 mg by mouth daily.    . furosemide (LASIX) 40 MG tablet Take 40 mg by mouth daily as needed for fluid.     Marland Kitchen ibuprofen (ADVIL,MOTRIN) 800 MG tablet Take 800 mg by mouth every 8 (eight) hours as needed.    Marland Kitchen lisinopril (PRINIVIL,ZESTRIL) 20 MG tablet Take 1 tablet (20 mg total) by mouth 2 (two) times daily. 60 tablet 0  . Multiple Vitamin (MULTIVITAMIN WITH MINERALS) TABS Take 1 tablet by mouth daily.    . sucralfate (CARAFATE) 1 g tablet Take 1 g by mouth 3 (three) times daily as needed (ulcer).     . Teriparatide, Recombinant, (FORTEO) 600 MCG/2.4ML SOLN Inject 0.08 mLs (20 mcg total) into the skin daily. 2.24 mL 0  . thiamine (VITAMIN B-1) 100 MG tablet Take 100 mg by mouth daily.    . vitamin B-12 (CYANOCOBALAMIN) 500 MCG tablet Take 500 mcg by mouth daily.    . vitamin C (ASCORBIC ACID) 500 MG tablet Take 500 mg by mouth daily.     No current facility-administered medications on file prior to visit.     PAST MEDICAL HISTORY: Past Medical History:  Diagnosis Date  . Adenomatous colon polyp   . Anemia yrs ago   PMH  . Anxiety   . Cataract   . Constipation due to pain medication   . Edema of lower extremity    Takes lasix PRN  . Gallstones   . Gastritis   . GERD (gastroesophageal reflux disease)    with meds  . Hiatal hernia   . History of recurrent UTIs   . Hyperlipidemia   . Hypertension   . IBS (irritable bowel syndrome)   . Interstitial cystitis   . Loose, teeth    in the front of mouth  . Osteoarthritis   . Osteoporosis    osteoarthritis  . Pre-diabetes   . Pseudogout   . Urgency of urination     PAST SURGICAL HISTORY: Past Surgical History:  Procedure Laterality Date  . ANKLE SURGERY Right   . BREAST LUMPECTOMY Left 1996   fibroid  . COLONOSCOPY W/ POLYPECTOMY    . COLONOSCOPY WITH PROPOFOL N/A 09/02/2016   Procedure: COLONOSCOPY WITH PROPOFOL;  Surgeon: Milus Banister, MD;  Location: [facility];  Service:  Endoscopy;  Laterality: N/A;  . EUS N/A 02/06/2015   Procedure: UPPER ENDOSCOPIC ULTRASOUND (EUS) LINEAR;  Surgeon: Milus Banister, MD;  Location: [facility];  Service: Endoscopy;  Laterality: N/A;  . EUS N/A 09/02/2016   Procedure: UPPER ENDOSCOPIC ULTRASOUND (EUS) LINEAR;  Surgeon: Milus Banister, MD;  Location: [facility];  Service: Endoscopy;  Laterality: N/A;  . EYE SURGERY Bilateral    lasix 14 yrs ago  . JOINT REPLACEMENT    . PARTIAL KNEE ARTHROPLASTY Left 11/26/2014   Procedure: LEFT UNICOMPARTMENTAL KNEE;  Surgeon: Renette Butters, MD;  Location: [place];  Service: Orthopedics;  Laterality: Left;  . TOTAL KNEE ARTHROPLASTY Right 04/09/2014   DR MURPHY  . TOTAL KNEE ARTHROPLASTY Right  04/09/2014   Procedure: TOTAL KNEE ARTHROPLASTY;  Surgeon: Renette Butters, MD;  Location: [place];  Service: Orthopedics;  Laterality: Right;  . TOTAL KNEE REVISION Left 11/03/2015   Procedure: TOTAL KNEE REVISION;  Surgeon: Renette Butters, MD;  Location: [place];  Service: Orthopedics;  Laterality: Left;    SOCIAL HISTORY: Social History   Tobacco Use  . Smoking status: Never Smoker  . Smokeless tobacco: Never Used  Substance Use Topics  . Alcohol use: No    Alcohol/week: 0.0 standard drinks  . Drug use: No    FAMILY HISTORY: Family History  Problem Relation Age of Onset  . Pancreatic cancer Mother        head of pancreas removed   . Diabetes Mother   . Hypertension Mother   . Diabetes Father   . Heart disease Father   . Hypertension Father   . Diabetes Maternal Grandmother   . Breast cancer Neg Hx   . Celiac disease Neg Hx   . Cirrhosis Neg Hx   . Clotting disorder Neg Hx   . Colitis Neg Hx   . Colon cancer Neg Hx   . Colon polyps Neg Hx   . Crohn's disease Neg Hx   . Cystic fibrosis Neg Hx   . Esophageal cancer Neg Hx   . Hemochromatosis Neg Hx   . Inflammatory bowel disease Neg Hx   . Irritable bowel syndrome Neg Hx   . Kidney disease Neg Hx   . Liver cancer Neg Hx    . Liver disease Neg Hx   . Ovarian cancer Neg Hx   . Prostate cancer Neg Hx   . Rectal cancer Neg Hx   . Stomach cancer Neg Hx   . Ulcerative colitis Neg Hx   . Uterine cancer Neg Hx   . Wilson's disease Neg Hx   . Goiter Neg Hx     ROS: Review of Systems  Constitutional: Negative for weight loss.  Endo/Heme/Allergies:       Negative hypoglycemia    PHYSICAL EXAM: Pt in no acute distress  RECENT LABS AND TESTS: BMET    Component Value Date/Time   NA 141 03/23/2018 1049   K 4.0 03/23/2018 1049   CL 101 03/23/2018 1049   CO2 24 03/23/2018 1049   GLUCOSE 133 (H) 03/23/2018 1049   GLUCOSE 156 (H) 05/04/2017 0034   BUN 10 03/23/2018 1049   CREATININE 0.66 03/23/2018 1049   CREATININE 0.65 01/22/2016 1130   CALCIUM 8.9 03/23/2018 1049   GFRNONAA 91 03/23/2018 1049   GFRAA 105 03/23/2018 1049   Lab Results  Component Value Date   HGBA1C 6.2 (H) 03/23/2018   HGBA1C 5.9 (A) 01/06/2018   HGBA1C 7.1 (H) 09/05/2017   HGBA1C 6.7 (H) 10/27/2015   HGBA1C 6.3 (H) 11/18/2014   Lab Results  Component Value Date   INSULIN 8.0 03/23/2018   INSULIN 10.7 09/05/2017   CBC    Component Value Date/Time   WBC 4.0 09/05/2017 0000   WBC 6.0 05/04/2017 0034   RBC 4.49 09/05/2017 0000   RBC 4.46 05/04/2017 0034   HGB 13.7 09/05/2017 0000   HCT 41.8 09/05/2017 0000   PLT 210 05/04/2017 0034   MCV 93 09/05/2017 0000   MCH 30.5 09/05/2017 0000   MCH 30.0 05/04/2017 0034   MCHC 32.8 09/05/2017 0000   MCHC 32.8 05/04/2017 0034   RDW 13.2 09/05/2017 0000   LYMPHSABS 1.4 09/05/2017 0000   MONOABS 0.4 08/05/2016 1618  EOSABS 0.1 09/05/2017 0000   BASOSABS 0.0 09/05/2017 0000   Iron/TIBC/Ferritin/ %Sat No results found for: IRON, TIBC, FERRITIN, IRONPCTSAT Lipid Panel     Component Value Date/Time   CHOL 182 03/23/2018 1049   TRIG 123 03/23/2018 1049   HDL 53 03/23/2018 1049   LDLCALC 104 (H) 03/23/2018 1049   Hepatic Function Panel     Component Value Date/Time   PROT  6.4 03/23/2018 1049   ALBUMIN 4.2 03/23/2018 1049   AST 14 03/23/2018 1049   ALT 8 03/23/2018 1049   ALKPHOS 98 03/23/2018 1049   BILITOT 0.5 03/23/2018 1049      Component Value Date/Time   TSH 0.343 (L) 09/05/2017 0000   TSH 0.60 01/07/2017 0930      I, Trixie Dredge, am acting as transcriptionist for Abby Potash, PA-C I, Abby Potash, PA-C have reviewed above note and agree with its content

## 2018-10-23 ENCOUNTER — Ambulatory Visit (INDEPENDENT_AMBULATORY_CARE_PROVIDER_SITE_OTHER): Payer: Medicare Other | Admitting: Physician Assistant

## 2018-10-30 ENCOUNTER — Telehealth (INDEPENDENT_AMBULATORY_CARE_PROVIDER_SITE_OTHER): Payer: Medicare Other | Admitting: Physician Assistant

## 2018-10-30 ENCOUNTER — Other Ambulatory Visit: Payer: Self-pay

## 2018-10-30 ENCOUNTER — Encounter (INDEPENDENT_AMBULATORY_CARE_PROVIDER_SITE_OTHER): Payer: Self-pay | Admitting: Physician Assistant

## 2018-10-30 DIAGNOSIS — Z6841 Body Mass Index (BMI) 40.0 and over, adult: Secondary | ICD-10-CM

## 2018-10-30 DIAGNOSIS — E1165 Type 2 diabetes mellitus with hyperglycemia: Secondary | ICD-10-CM | POA: Diagnosis not present

## 2018-10-31 ENCOUNTER — Other Ambulatory Visit: Payer: Self-pay | Admitting: Sports Medicine

## 2018-10-31 DIAGNOSIS — M7061 Trochanteric bursitis, right hip: Secondary | ICD-10-CM | POA: Diagnosis not present

## 2018-10-31 DIAGNOSIS — M810 Age-related osteoporosis without current pathological fracture: Secondary | ICD-10-CM

## 2018-11-01 ENCOUNTER — Ambulatory Visit (INDEPENDENT_AMBULATORY_CARE_PROVIDER_SITE_OTHER): Payer: Medicare Other | Admitting: Physician Assistant

## 2018-11-01 NOTE — Progress Notes (Signed)
Office: [phone]  /  Fax: [phone] TeleHealth Visit:  Jessica Good has verbally consented to this TeleHealth visit today. The patient is located at home, the provider is located at the [facility] office. The participants in this visit include the listed provider and patient. The visit was conducted today via FaceTime.  HPI:   Chief Complaint: OBESITY Jessica Good is here to discuss her progress with her obesity treatment plan. She is on the Category 2 plan and is following her eating plan approximately 30% of the time. She states she is walking 10 minutes 7 times per week. Jessica Good reports that she is getting used to her new dentures and eating a lot of soft foods. She is trying to get in protein through tuna and protein shakes. We were unable to weigh the patient today for this TeleHealth visit. She feels as if she has lost 4 lbs since her last visit. She has lost 8 lbs since starting treatment with [place].  Diabetes II with Hyperglycemia Jessica Good has a diagnosis of diabetes type II and is on no medication. Jessica Good states she is not checking her blood sugars regularly. Last A1c was 6.2 on 03/23/2018. She has been working on intensive lifestyle modifications including diet, exercise, and weight loss to help control her blood glucose levels. No hypoglycemia.  ASSESSMENT AND PLAN:  No diagnosis found.  PLAN:  Diabetes II with Hyperglycemia Jessica Good has been given extensive diabetes education by myself today including ideal fasting and post-prandial blood glucose readings, individual ideal HgA1c goals  and hypoglycemia prevention. We discussed the importance of good blood sugar control to decrease the likelihood of diabetic complications such as nephropathy, neuropathy, limb loss, blindness, coronary artery disease, and death. We discussed the importance of intensive lifestyle modification including diet, exercise and weight loss as the first line treatment for diabetes. Jessica Good  agrees to continue her diabetes medications and will follow-up at the agreed upon time.  Obesity Jessica Good is currently in the action stage of change. As such, her goal is to continue with weight loss efforts. She has agreed to follow the Category 2 plan. Jessica Good has been instructed to work up to a goal of 150 minutes of combined cardio and strengthening exercise per week for weight loss and overall health benefits. We discussed the following Behavioral Modification Strategies today: increasing lean protein intake and no skipping meals.  Jessica Good has agreed to follow-up with our clinic in 2 weeks. She was informed of the importance of frequent follow-up visits to maximize her success with intensive lifestyle modifications for her multiple health conditions.  ALLERGIES: Allergies  Allergen Reactions   Demerol [Meperidine] Other (See Comments)    caused excessive sleepiness   Latex Rash    Pt. Stated she has a " latex allergy" and it "gives her a rash".   Sulfa Antibiotics Other (See Comments)    : Flu-like symptoms   Adhesive [Tape] Rash    Reaction to bandaids    MEDICATIONS: Current Outpatient Medications on File Prior to Visit  Medication Sig Dispense Refill   acetaminophen (TYLENOL) 500 MG tablet Take 500 mg by mouth every 6 (six) hours as needed for moderate pain.     aspirin 81 MG tablet Take 81 mg by mouth daily.     baclofen (LIORESAL) 10 MG tablet Take 1 tablet by mouth daily.     cholecalciferol (VITAMIN D) 1000 UNITS tablet Take 2,000 Units by mouth daily.      furosemide (LASIX) 40 MG tablet  Take 40 mg by mouth daily as needed for fluid.      ibuprofen (ADVIL,MOTRIN) 800 MG tablet Take 800 mg by mouth every 8 (eight) hours as needed.     lisinopril (PRINIVIL,ZESTRIL) 20 MG tablet Take 1 tablet (20 mg total) by mouth 2 (two) times daily. 60 tablet 0   Multiple Vitamin (MULTIVITAMIN WITH MINERALS) TABS Take 1 tablet by mouth daily.     sucralfate (CARAFATE) 1 g  tablet Take 1 g by mouth 3 (three) times daily as needed (ulcer).      Teriparatide, Recombinant, (FORTEO) 600 MCG/2.4ML SOLN Inject 0.08 mLs (20 mcg total) into the skin daily. 2.24 mL 0   thiamine (VITAMIN B-1) 100 MG tablet Take 100 mg by mouth daily.     vitamin B-12 (CYANOCOBALAMIN) 500 MCG tablet Take 500 mcg by mouth daily.     vitamin C (ASCORBIC ACID) 500 MG tablet Take 500 mg by mouth daily.     No current facility-administered medications on file prior to visit.     PAST MEDICAL HISTORY: Past Medical History:  Diagnosis Date   Adenomatous colon polyp    Anemia yrs ago   PMH   Anxiety    Cataract    Constipation due to pain medication    Edema of lower extremity    Takes lasix PRN   Gallstones    Gastritis    GERD (gastroesophageal reflux disease)    with meds   Hiatal hernia    History of recurrent UTIs    Hyperlipidemia    Hypertension    IBS (irritable bowel syndrome)    Interstitial cystitis    Loose, teeth    in the front of mouth   Osteoarthritis    Osteoporosis    osteoarthritis   Pre-diabetes    Pseudogout    Urgency of urination     PAST SURGICAL HISTORY: Past Surgical History:  Procedure Laterality Date   ANKLE SURGERY Right    BREAST LUMPECTOMY Left 1996   fibroid   COLONOSCOPY W/ POLYPECTOMY     COLONOSCOPY WITH PROPOFOL N/A 09/02/2016   Procedure: COLONOSCOPY WITH PROPOFOL;  Surgeon: Milus Banister, MD;  Location[facility];  Service: Endoscopy;  Laterality: N/A;   EUS N/A 02/06/2015   Procedure: UPPER ENDOSCOPIC ULTRASOUND (EUS) LINEAR;  Surgeon: Milus Banister, MD;  Location[facility];  Service: Endoscopy;  Laterality: N/A;   EUS N/A 09/02/2016   Procedure: UPPER ENDOSCOPIC ULTRASOUND (EUS) LINEAR;  Surgeon: Milus Banister, MD;  Location[facility];  Service: Endoscopy;  Laterality: N/A;   EYE SURGERY Bilateral    lasix 14 yrs ago   JOINT REPLACEMENT     PARTIAL KNEE ARTHROPLASTY Left  11/26/2014   Procedure: LEFT UNICOMPARTMENTAL KNEE;  Surgeon: Renette Butters, MD;  Location: [place];  Service: Orthopedics;  Laterality: Left;   TOTAL KNEE ARTHROPLASTY Right 04/09/2014   DR MURPHY   TOTAL KNEE ARTHROPLASTY Right 04/09/2014   Procedure: TOTAL KNEE ARTHROPLASTY;  Surgeon: Renette Butters, MD;  Location: [place];  Service: Orthopedics;  Laterality: Right;   TOTAL KNEE REVISION Left 11/03/2015   Procedure: TOTAL KNEE REVISION;  Surgeon: Renette Butters, MD;  Location: [place];  Service: Orthopedics;  Laterality: Left;    SOCIAL HISTORY: Social History   Tobacco Use   Smoking status: Never Smoker   Smokeless tobacco: Never Used  Substance Use Topics   Alcohol use: No    Alcohol/week: 0.0 standard drinks   Drug use:  No    FAMILY HISTORY: Family History  Problem Relation Age of Onset   Pancreatic cancer Mother        head of pancreas removed    Diabetes Mother    Hypertension Mother    Diabetes Father    Heart disease Father    Hypertension Father    Diabetes Maternal Grandmother    Breast cancer Neg Hx    Celiac disease Neg Hx    Cirrhosis Neg Hx    Clotting disorder Neg Hx    Colitis Neg Hx    Colon cancer Neg Hx    Colon polyps Neg Hx    Crohn's disease Neg Hx    Cystic fibrosis Neg Hx    Esophageal cancer Neg Hx    Hemochromatosis Neg Hx    Inflammatory bowel disease Neg Hx    Irritable bowel syndrome Neg Hx    Kidney disease Neg Hx    Liver cancer Neg Hx    Liver disease Neg Hx    Ovarian cancer Neg Hx    Prostate cancer Neg Hx    Rectal cancer Neg Hx    Stomach cancer Neg Hx    Ulcerative colitis Neg Hx    Uterine cancer Neg Hx    Wilson's disease Neg Hx    Goiter Neg Hx    ROS: Review of Systems  Endo/Heme/Allergies:       Negative for hypoglycemia.   PHYSICAL EXAM: Pt in no acute distress  RECENT LABS AND TESTS: BMET    Component Value Date/Time   NA 141 03/23/2018 1049   K 4.0 03/23/2018 1049     CL 101 03/23/2018 1049   CO2 24 03/23/2018 1049   GLUCOSE 133 (H) 03/23/2018 1049   GLUCOSE 156 (H) 05/04/2017 0034   BUN 10 03/23/2018 1049   CREATININE 0.66 03/23/2018 1049   CREATININE 0.65 01/22/2016 1130   CALCIUM 8.9 03/23/2018 1049   GFRNONAA 91 03/23/2018 1049   GFRAA 105 03/23/2018 1049   Lab Results  Component Value Date   HGBA1C 6.2 (H) 03/23/2018   HGBA1C 5.9 (A) 01/06/2018   HGBA1C 7.1 (H) 09/05/2017   HGBA1C 6.7 (H) 10/27/2015   HGBA1C 6.3 (H) 11/18/2014   Lab Results  Component Value Date   INSULIN 8.0 03/23/2018   INSULIN 10.7 09/05/2017   CBC    Component Value Date/Time   WBC 4.0 09/05/2017 0000   WBC 6.0 05/04/2017 0034   RBC 4.49 09/05/2017 0000   RBC 4.46 05/04/2017 0034   HGB 13.7 09/05/2017 0000   HCT 41.8 09/05/2017 0000   PLT 210 05/04/2017 0034   MCV 93 09/05/2017 0000   MCH 30.5 09/05/2017 0000   MCH 30.0 05/04/2017 0034   MCHC 32.8 09/05/2017 0000   MCHC 32.8 05/04/2017 0034   RDW 13.2 09/05/2017 0000   LYMPHSABS 1.4 09/05/2017 0000   MONOABS 0.4 08/05/2016 1618   EOSABS 0.1 09/05/2017 0000   BASOSABS 0.0 09/05/2017 0000   Iron/TIBC/Ferritin/ %Sat No results found for: IRON, TIBC, FERRITIN, IRONPCTSAT Lipid Panel     Component Value Date/Time   CHOL 182 03/23/2018 1049   TRIG 123 03/23/2018 1049   HDL 53 03/23/2018 1049   LDLCALC 104 (H) 03/23/2018 1049   Hepatic Function Panel     Component Value Date/Time   PROT 6.4 03/23/2018 1049   ALBUMIN 4.2 03/23/2018 1049   AST 14 03/23/2018 1049   ALT 8 03/23/2018 1049   ALKPHOS 98 03/23/2018 1049  BILITOT 0.5 03/23/2018 1049      Component Value Date/Time   TSH 0.343 (L) 09/05/2017 0000   TSH 0.60 01/07/2017 0930   Results for ETOLA, MULL (MRN [id number]) as of 11/01/2018 16:43  Ref. Range 03/23/2018 10:49  Vitamin D, 25-Hydroxy Latest Ref Range: 30.0 - 100.0 ng/mL 46.6    I, Michaelene Song, am acting as Location manager for [employer], PA-C I, Abby Potash,  PA-C have reviewed above note and agree with its content

## 2018-11-14 ENCOUNTER — Ambulatory Visit (INDEPENDENT_AMBULATORY_CARE_PROVIDER_SITE_OTHER): Payer: Medicare Other | Admitting: Physician Assistant

## 2018-11-14 ENCOUNTER — Other Ambulatory Visit: Payer: Self-pay

## 2018-11-14 DIAGNOSIS — Z6841 Body Mass Index (BMI) 40.0 and over, adult: Secondary | ICD-10-CM

## 2018-11-14 DIAGNOSIS — E559 Vitamin D deficiency, unspecified: Secondary | ICD-10-CM | POA: Diagnosis not present

## 2018-11-14 NOTE — Progress Notes (Signed)
Office: [phone]  /  Fax: [phone] TeleHealth Visit:  Jessica Good has verbally consented to this TeleHealth visit today. The patient is located at home, the provider is located at the [employer] [facility] office. The participants in this visit include the listed provider, patient, and the patient's grandchildren. The visit was conducted today via telephone call.  HPI:   Chief Complaint: OBESITY Jessica Good is here to discuss her progress with her obesity treatment plan. She is on the Category 2 plan. She states she is walking daily.  Jessica Good reports her most recent weight to be 260 lbs. She continues to have issues with her dentures and has to eat soft food. She is not getting enough protein. We were unable to weigh the patient today for this TeleHealth visit. She states her weight today was 260 lbs. She has lost 8 lbs since starting treatment with [place].  Vitamin D deficiency Jessica Good has a diagnosis of Vitamin D deficiency. She is currently taking daily OTC Vit D and denies nausea, vomiting or muscle weakness.  ASSESSMENT AND PLAN:  Vitamin D deficiency  Class 3 severe obesity with serious comorbidity and body mass index (BMI) of 45.0 to 49.9 in adult, unspecified obesity type ([place])  PLAN:  Vitamin D Deficiency Jessica Good was informed that low Vitamin D levels contributes to fatigue and are associated with obesity, breast, and colon cancer. She agrees to continue taking OTC Vit D daily and will follow-up for routine testing of Vitamin D, at least 2-3 times per year. She was informed of the risk of over-replacement of Vitamin D and agrees to not increase her dose unless she discusses this with [place] first. Jessica Good agrees to follow-up with our clinic in 4 weeks.  Obesity Jessica Good is currently in the action stage of change. As such, her goal is to continue with weight loss efforts. She has agreed to follow the Category 2 plan. Jessica Good has been instructed to work up to a goal of 150  minutes of combined cardio and strengthening exercise per week for weight loss and overall health benefits. We discussed the following Behavioral Modification Strategies today: increasing lean protein intake and no skipping meals.  Jessica Good has agreed to follow-up with our clinic in 4 weeks. She was informed of the importance of frequent follow-up visits to maximize her success with intensive lifestyle modifications for her multiple health conditions.  ALLERGIES: Allergies  Allergen Reactions   Demerol [Meperidine] Other (See Comments)    caused excessive sleepiness   Latex Rash    Pt. Stated she has a " latex allergy" and it "gives her a rash".   Sulfa Antibiotics Other (See Comments)    : Flu-like symptoms   Adhesive [Tape] Rash    Reaction to bandaids    MEDICATIONS: Current Outpatient Medications on File Prior to Visit  Medication Sig Dispense Refill   acetaminophen (TYLENOL) 500 MG tablet Take 500 mg by mouth every 6 (six) hours as needed for moderate pain.     aspirin 81 MG tablet Take 81 mg by mouth daily.     baclofen (LIORESAL) 10 MG tablet Take 1 tablet by mouth daily.     cholecalciferol (VITAMIN D) 1000 UNITS tablet Take 2,000 Units by mouth daily.      furosemide (LASIX) 40 MG tablet Take 40 mg by mouth daily as needed for fluid.      ibuprofen (ADVIL,MOTRIN) 800 MG tablet Take 800 mg by mouth every 8 (eight) hours as needed.  lisinopril (PRINIVIL,ZESTRIL) 20 MG tablet Take 1 tablet (20 mg total) by mouth 2 (two) times daily. 60 tablet 0   Multiple Vitamin (MULTIVITAMIN WITH MINERALS) TABS Take 1 tablet by mouth daily.     sucralfate (CARAFATE) 1 g tablet Take 1 g by mouth 3 (three) times daily as needed (ulcer).      Teriparatide, Recombinant, (FORTEO) 600 MCG/2.4ML SOLN Inject 0.08 mLs (20 mcg total) into the skin daily. 2.24 mL 0   thiamine (VITAMIN B-1) 100 MG tablet Take 100 mg by mouth daily.     vitamin B-12 (CYANOCOBALAMIN) 500 MCG tablet Take 500  mcg by mouth daily.     vitamin C (ASCORBIC ACID) 500 MG tablet Take 500 mg by mouth daily.     No current facility-administered medications on file prior to visit.     PAST MEDICAL HISTORY: Past Medical History:  Diagnosis Date   Adenomatous colon polyp    Anemia yrs ago   PMH   Anxiety    Cataract    Constipation due to pain medication    Edema of lower extremity    Takes lasix PRN   Gallstones    Gastritis    GERD (gastroesophageal reflux disease)    with meds   Hiatal hernia    History of recurrent UTIs    Hyperlipidemia    Hypertension    IBS (irritable bowel syndrome)    Interstitial cystitis    Loose, teeth    in the front of mouth   Osteoarthritis    Osteoporosis    osteoarthritis   Pre-diabetes    Pseudogout    Urgency of urination     PAST SURGICAL HISTORY: Past Surgical History:  Procedure Laterality Date   ANKLE SURGERY Right    BREAST LUMPECTOMY Left 1996   fibroid   COLONOSCOPY W/ POLYPECTOMY     COLONOSCOPY WITH PROPOFOL N/A 09/02/2016   Procedure: COLONOSCOPY WITH PROPOFOL;  Surgeon: Milus Banister, MD;  Location: [facility];  Service: Endoscopy;  Laterality: N/A;   EUS N/A 02/06/2015   Procedure: UPPER ENDOSCOPIC ULTRASOUND (EUS) LINEAR;  Surgeon: Milus Banister, MD;  Location: [facility];  Service: Endoscopy;  Laterality: N/A;   EUS N/A 09/02/2016   Procedure: UPPER ENDOSCOPIC ULTRASOUND (EUS) LINEAR;  Surgeon: Milus Banister, MD;  [facility];  Service: Endoscopy;  Laterality: N/A;   EYE SURGERY Bilateral    lasix 14 yrs ago   JOINT REPLACEMENT     PARTIAL KNEE ARTHROPLASTY Left 11/26/2014   Procedure: LEFT UNICOMPARTMENTAL KNEE;  Surgeon: Renette Butters, MD;  Location: [place];  Service: Orthopedics;  Laterality: Left;   TOTAL KNEE ARTHROPLASTY Right 04/09/2014   DR MURPHY   TOTAL KNEE ARTHROPLASTY Right 04/09/2014   Procedure: TOTAL KNEE ARTHROPLASTY;  Surgeon: Renette Butters, MD;  Location:  [place];  Service: Orthopedics;  Laterality: Right;   TOTAL KNEE REVISION Left 11/03/2015   Procedure: TOTAL KNEE REVISION;  Surgeon: Renette Butters, MD;  Location: [place];  Service: Orthopedics;  Laterality: Left;    SOCIAL HISTORY: Social History   Tobacco Use   Smoking status: Never Smoker   Smokeless tobacco: Never Used  Substance Use Topics   Alcohol use: No    Alcohol/week: 0.0 standard drinks   Drug use: No    FAMILY HISTORY: Family History  Problem Relation Age of Onset   Pancreatic cancer Mother        head of pancreas removed    Diabetes Mother  Hypertension Mother    Diabetes Father    Heart disease Father    Hypertension Father    Diabetes Maternal Grandmother    Breast cancer Neg Hx    Celiac disease Neg Hx    Cirrhosis Neg Hx    Clotting disorder Neg Hx    Colitis Neg Hx    Colon cancer Neg Hx    Colon polyps Neg Hx    Crohn's disease Neg Hx    Cystic fibrosis Neg Hx    Esophageal cancer Neg Hx    Hemochromatosis Neg Hx    Inflammatory bowel disease Neg Hx    Irritable bowel syndrome Neg Hx    Kidney disease Neg Hx    Liver cancer Neg Hx    Liver disease Neg Hx    Ovarian cancer Neg Hx    Prostate cancer Neg Hx    Rectal cancer Neg Hx    Stomach cancer Neg Hx    Ulcerative colitis Neg Hx    Uterine cancer Neg Hx    Wilson's disease Neg Hx    Goiter Neg Hx    ROS: Review of Systems  Gastrointestinal: Negative for nausea and vomiting.  Musculoskeletal:       Negative for muscle weakness.   PHYSICAL EXAM: Pt in no acute distress  RECENT LABS AND TESTS: BMET    Component Value Date/Time   NA 141 03/23/2018 1049   K 4.0 03/23/2018 1049   CL 101 03/23/2018 1049   CO2 24 03/23/2018 1049   GLUCOSE 133 (H) 03/23/2018 1049   GLUCOSE 156 (H) 05/04/2017 0034   BUN 10 03/23/2018 1049   CREATININE 0.66 03/23/2018 1049   CREATININE 0.65 01/22/2016 1130   CALCIUM 8.9 03/23/2018 1049   GFRNONAA 91 03/23/2018  1049   GFRAA 105 03/23/2018 1049   Lab Results  Component Value Date   HGBA1C 6.2 (H) 03/23/2018   HGBA1C 5.9 (A) 01/06/2018   HGBA1C 7.1 (H) 09/05/2017   HGBA1C 6.7 (H) 10/27/2015   HGBA1C 6.3 (H) 11/18/2014   Lab Results  Component Value Date   INSULIN 8.0 03/23/2018   INSULIN 10.7 09/05/2017   CBC    Component Value Date/Time   WBC 4.0 09/05/2017 0000   WBC 6.0 05/04/2017 0034   RBC 4.49 09/05/2017 0000   RBC 4.46 05/04/2017 0034   HGB 13.7 09/05/2017 0000   HCT 41.8 09/05/2017 0000   PLT 210 05/04/2017 0034   MCV 93 09/05/2017 0000   MCH 30.5 09/05/2017 0000   MCH 30.0 05/04/2017 0034   MCHC 32.8 09/05/2017 0000   MCHC 32.8 05/04/2017 0034   RDW 13.2 09/05/2017 0000   LYMPHSABS 1.4 09/05/2017 0000   MONOABS 0.4 08/05/2016 1618   EOSABS 0.1 09/05/2017 0000   BASOSABS 0.0 09/05/2017 0000   Iron/TIBC/Ferritin/ %Sat No results found for: IRON, TIBC, FERRITIN, IRONPCTSAT Lipid Panel     Component Value Date/Time   CHOL 182 03/23/2018 1049   TRIG 123 03/23/2018 1049   HDL 53 03/23/2018 1049   LDLCALC 104 (H) 03/23/2018 1049   Hepatic Function Panel     Component Value Date/Time   PROT 6.4 03/23/2018 1049   ALBUMIN 4.2 03/23/2018 1049   AST 14 03/23/2018 1049   ALT 8 03/23/2018 1049   ALKPHOS 98 03/23/2018 1049   BILITOT 0.5 03/23/2018 1049      Component Value Date/Time   TSH 0.343 (L) 09/05/2017 0000   TSH 0.60 01/07/2017 0930   Results for Pattricia Boss  D (MRN [id number]) as of 11/14/2018 14:48  Ref. Range 03/23/2018 10:49  Vitamin D, 25-Hydroxy Latest Ref Range: 30.0 - 100.0 ng/mL 46.6   I, Michaelene Song, am acting as Location manager for [employer], PA-C I, Abby Potash, PA-C have reviewed above note and agree with its content

## 2018-11-16 DIAGNOSIS — E119 Type 2 diabetes mellitus without complications: Secondary | ICD-10-CM | POA: Diagnosis not present

## 2018-11-16 DIAGNOSIS — H2513 Age-related nuclear cataract, bilateral: Secondary | ICD-10-CM | POA: Diagnosis not present

## 2018-11-27 ENCOUNTER — Telehealth (INDEPENDENT_AMBULATORY_CARE_PROVIDER_SITE_OTHER): Payer: Medicare Other | Admitting: Physician Assistant

## 2018-12-20 DIAGNOSIS — H2512 Age-related nuclear cataract, left eye: Secondary | ICD-10-CM | POA: Diagnosis not present

## 2018-12-20 DIAGNOSIS — H2511 Age-related nuclear cataract, right eye: Secondary | ICD-10-CM | POA: Diagnosis not present

## 2018-12-26 ENCOUNTER — Other Ambulatory Visit (INDEPENDENT_AMBULATORY_CARE_PROVIDER_SITE_OTHER): Payer: Self-pay | Admitting: Family Medicine

## 2018-12-26 DIAGNOSIS — I10 Essential (primary) hypertension: Secondary | ICD-10-CM

## 2018-12-27 DIAGNOSIS — H2512 Age-related nuclear cataract, left eye: Secondary | ICD-10-CM | POA: Diagnosis not present

## 2019-01-01 ENCOUNTER — Telehealth: Payer: Self-pay | Admitting: Internal Medicine

## 2019-01-01 MED ORDER — SUCRALFATE 1 G PO TABS: 1.0000 g | ORAL_TABLET | Freq: Three times a day (TID) | ORAL | 1 refills | Status: DC

## 2019-01-01 NOTE — Telephone Encounter (Signed)
Pt reported that she is having a diverticulosis flare up and would like advice.

## 2019-01-01 NOTE — Telephone Encounter (Signed)
Pt states she started having nausea several days ago along with stomach pain. She states her stomach hurts and she has also started having indigestion. Pt states the only thing that helps is eating something. She states it has been quite a while since she had an issue with gastritis and the carafate that she has is from 2017. Please advise.

## 2019-01-01 NOTE — Telephone Encounter (Signed)
Spoke with pt and she is aware. Script sent to pharmacy and pt knows to call back with an update.

## 2019-01-01 NOTE — Telephone Encounter (Signed)
Ok to try carafate again, 1 g TIDAC and HS for 2-4 weeks If this is not helping, I would then add PPI Have her try this for a few days and let me know Thanks

## 2019-01-12 ENCOUNTER — Other Ambulatory Visit: Payer: Self-pay

## 2019-02-16 DIAGNOSIS — I10 Essential (primary) hypertension: Secondary | ICD-10-CM | POA: Diagnosis not present

## 2019-02-16 DIAGNOSIS — M6530 Trigger finger, unspecified finger: Secondary | ICD-10-CM | POA: Diagnosis not present

## 2019-02-22 ENCOUNTER — Other Ambulatory Visit: Payer: Self-pay | Admitting: Internal Medicine

## 2019-03-05 DIAGNOSIS — T1512XA Foreign body in conjunctival sac, left eye, initial encounter: Secondary | ICD-10-CM | POA: Diagnosis not present

## 2019-03-07 ENCOUNTER — Other Ambulatory Visit: Payer: Self-pay | Admitting: Sports Medicine

## 2019-03-07 DIAGNOSIS — M25551 Pain in right hip: Secondary | ICD-10-CM | POA: Diagnosis not present

## 2019-03-07 DIAGNOSIS — S22000A Wedge compression fracture of unspecified thoracic vertebra, initial encounter for closed fracture: Secondary | ICD-10-CM | POA: Diagnosis not present

## 2019-03-07 DIAGNOSIS — M48061 Spinal stenosis, lumbar region without neurogenic claudication: Secondary | ICD-10-CM | POA: Diagnosis not present

## 2019-03-07 DIAGNOSIS — M546 Pain in thoracic spine: Secondary | ICD-10-CM

## 2019-03-07 DIAGNOSIS — G8929 Other chronic pain: Secondary | ICD-10-CM

## 2019-03-27 ENCOUNTER — Other Ambulatory Visit: Payer: Self-pay

## 2019-03-27 ENCOUNTER — Ambulatory Visit
Admission: RE | Admit: 2019-03-27 | Discharge: 2019-03-27 | Disposition: A | Discharge status: Home / Self Care | Payer: Medicare Other | Source: Ambulatory Visit | Attending: Sports Medicine | Admitting: Sports Medicine

## 2019-03-27 DIAGNOSIS — G8929 Other chronic pain: Secondary | ICD-10-CM

## 2019-03-27 DIAGNOSIS — M546 Pain in thoracic spine: Secondary | ICD-10-CM

## 2019-03-27 DIAGNOSIS — M48061 Spinal stenosis, lumbar region without neurogenic claudication: Secondary | ICD-10-CM | POA: Diagnosis not present

## 2019-03-28 ENCOUNTER — Other Ambulatory Visit: Payer: Self-pay

## 2019-05-14 DIAGNOSIS — Z961 Presence of intraocular lens: Secondary | ICD-10-CM | POA: Diagnosis not present

## 2019-05-17 DIAGNOSIS — E119 Type 2 diabetes mellitus without complications: Secondary | ICD-10-CM | POA: Diagnosis not present

## 2019-05-17 DIAGNOSIS — M810 Age-related osteoporosis without current pathological fracture: Secondary | ICD-10-CM | POA: Diagnosis not present

## 2019-05-17 DIAGNOSIS — E559 Vitamin D deficiency, unspecified: Secondary | ICD-10-CM | POA: Diagnosis not present

## 2019-05-17 DIAGNOSIS — M1990 Unspecified osteoarthritis, unspecified site: Secondary | ICD-10-CM | POA: Diagnosis not present

## 2019-05-17 DIAGNOSIS — I10 Essential (primary) hypertension: Secondary | ICD-10-CM | POA: Diagnosis not present

## 2019-05-23 DIAGNOSIS — Z0000 Encounter for general adult medical examination without abnormal findings: Secondary | ICD-10-CM | POA: Diagnosis not present

## 2019-05-23 DIAGNOSIS — E785 Hyperlipidemia, unspecified: Secondary | ICD-10-CM | POA: Diagnosis not present

## 2019-05-23 DIAGNOSIS — B372 Candidiasis of skin and nail: Secondary | ICD-10-CM | POA: Diagnosis not present

## 2019-05-23 DIAGNOSIS — F419 Anxiety disorder, unspecified: Secondary | ICD-10-CM | POA: Diagnosis not present

## 2019-05-29 DIAGNOSIS — M810 Age-related osteoporosis without current pathological fracture: Secondary | ICD-10-CM | POA: Diagnosis not present

## 2019-05-29 DIAGNOSIS — M25571 Pain in right ankle and joints of right foot: Secondary | ICD-10-CM | POA: Diagnosis not present

## 2019-06-18 ENCOUNTER — Other Ambulatory Visit: Payer: Self-pay

## 2019-07-03 ENCOUNTER — Other Ambulatory Visit: Payer: Self-pay

## 2019-07-03 ENCOUNTER — Ambulatory Visit
Admission: RE | Admit: 2019-07-03 | Discharge: 2019-07-03 | Disposition: A | Discharge status: Home / Self Care | Payer: Medicare Other | Source: Ambulatory Visit | Attending: Sports Medicine | Admitting: Sports Medicine

## 2019-07-03 DIAGNOSIS — Z780 Asymptomatic menopausal state: Secondary | ICD-10-CM | POA: Diagnosis not present

## 2019-07-03 DIAGNOSIS — M8588 Other specified disorders of bone density and structure, other site: Secondary | ICD-10-CM | POA: Diagnosis not present

## 2019-07-03 DIAGNOSIS — M810 Age-related osteoporosis without current pathological fracture: Secondary | ICD-10-CM

## 2019-09-11 DIAGNOSIS — M1611 Unilateral primary osteoarthritis, right hip: Secondary | ICD-10-CM | POA: Diagnosis not present

## 2019-09-11 DIAGNOSIS — M48061 Spinal stenosis, lumbar region without neurogenic claudication: Secondary | ICD-10-CM | POA: Diagnosis not present

## 2019-10-31 ENCOUNTER — Telehealth: Payer: Self-pay | Admitting: Internal Medicine

## 2019-10-31 NOTE — Telephone Encounter (Signed)
Can try sucralfate 1 g TIDAC and HS PRN If not better she should let me know as I would next try PPI She should avoid nsaids If persistent would eval gallbladder as well JMP

## 2019-10-31 NOTE — Telephone Encounter (Signed)
Spoke with pt and she thinks she has gastritis again. States she has been having back pain that goes straight through to her stomach, describes is as an ache like someone has hit you in the stomach. Pt wanted to know if she could have some medication called in to take. She has taken carafate in the past. Please advise.

## 2019-10-31 NOTE — Telephone Encounter (Signed)
Patient calling to see if something can be called in for her she thinks she has gastritis again

## 2019-11-01 NOTE — Telephone Encounter (Signed)
Spoke with pt and she has some of the carafate and will try this. Pt knows to call back if the carafate does not help.

## 2019-11-29 DIAGNOSIS — F419 Anxiety disorder, unspecified: Secondary | ICD-10-CM | POA: Diagnosis not present

## 2019-11-29 DIAGNOSIS — R300 Dysuria: Secondary | ICD-10-CM | POA: Diagnosis not present

## 2019-11-29 DIAGNOSIS — M130 Polyarthritis, unspecified: Secondary | ICD-10-CM | POA: Diagnosis not present

## 2019-11-30 DIAGNOSIS — Z79899 Other long term (current) drug therapy: Secondary | ICD-10-CM | POA: Diagnosis not present

## 2019-11-30 DIAGNOSIS — M130 Polyarthritis, unspecified: Secondary | ICD-10-CM | POA: Diagnosis not present

## 2019-12-12 DIAGNOSIS — M545 Low back pain: Secondary | ICD-10-CM | POA: Diagnosis not present

## 2019-12-12 DIAGNOSIS — M48061 Spinal stenosis, lumbar region without neurogenic claudication: Secondary | ICD-10-CM | POA: Diagnosis not present

## 2019-12-12 DIAGNOSIS — M810 Age-related osteoporosis without current pathological fracture: Secondary | ICD-10-CM | POA: Diagnosis not present

## 2020-02-12 DIAGNOSIS — E119 Type 2 diabetes mellitus without complications: Secondary | ICD-10-CM | POA: Diagnosis not present

## 2020-02-12 DIAGNOSIS — R413 Other amnesia: Secondary | ICD-10-CM | POA: Diagnosis not present

## 2020-05-22 DIAGNOSIS — H04123 Dry eye syndrome of bilateral lacrimal glands: Secondary | ICD-10-CM | POA: Diagnosis not present

## 2020-05-22 DIAGNOSIS — Z961 Presence of intraocular lens: Secondary | ICD-10-CM | POA: Diagnosis not present

## 2020-06-05 DIAGNOSIS — E785 Hyperlipidemia, unspecified: Secondary | ICD-10-CM | POA: Diagnosis not present

## 2020-06-05 DIAGNOSIS — N390 Urinary tract infection, site not specified: Secondary | ICD-10-CM | POA: Diagnosis not present

## 2020-06-05 DIAGNOSIS — I10 Essential (primary) hypertension: Secondary | ICD-10-CM | POA: Diagnosis not present

## 2020-07-02 DIAGNOSIS — M48061 Spinal stenosis, lumbar region without neurogenic claudication: Secondary | ICD-10-CM | POA: Diagnosis not present

## 2020-07-02 DIAGNOSIS — M810 Age-related osteoporosis without current pathological fracture: Secondary | ICD-10-CM | POA: Diagnosis not present

## 2020-08-11 DIAGNOSIS — M542 Cervicalgia: Secondary | ICD-10-CM | POA: Diagnosis not present

## 2020-08-11 DIAGNOSIS — M5451 Vertebrogenic low back pain: Secondary | ICD-10-CM | POA: Diagnosis not present

## 2020-08-15 DIAGNOSIS — M5451 Vertebrogenic low back pain: Secondary | ICD-10-CM | POA: Diagnosis not present

## 2020-08-15 DIAGNOSIS — M542 Cervicalgia: Secondary | ICD-10-CM | POA: Diagnosis not present

## 2020-08-16 DIAGNOSIS — I10 Essential (primary) hypertension: Secondary | ICD-10-CM | POA: Diagnosis not present

## 2020-08-16 DIAGNOSIS — E785 Hyperlipidemia, unspecified: Secondary | ICD-10-CM | POA: Diagnosis not present

## 2020-08-16 DIAGNOSIS — E119 Type 2 diabetes mellitus without complications: Secondary | ICD-10-CM | POA: Diagnosis not present

## 2020-08-16 DIAGNOSIS — Z79899 Other long term (current) drug therapy: Secondary | ICD-10-CM | POA: Diagnosis not present

## 2020-08-22 DIAGNOSIS — I10 Essential (primary) hypertension: Secondary | ICD-10-CM | POA: Diagnosis not present

## 2020-08-22 DIAGNOSIS — R300 Dysuria: Secondary | ICD-10-CM | POA: Diagnosis not present

## 2020-08-22 DIAGNOSIS — B372 Candidiasis of skin and nail: Secondary | ICD-10-CM | POA: Diagnosis not present

## 2020-08-22 DIAGNOSIS — D229 Melanocytic nevi, unspecified: Secondary | ICD-10-CM | POA: Diagnosis not present

## 2020-08-22 DIAGNOSIS — R5383 Other fatigue: Secondary | ICD-10-CM | POA: Diagnosis not present

## 2020-08-22 DIAGNOSIS — M545 Low back pain, unspecified: Secondary | ICD-10-CM | POA: Diagnosis not present

## 2020-08-22 DIAGNOSIS — R053 Chronic cough: Secondary | ICD-10-CM | POA: Diagnosis not present

## 2020-08-27 DIAGNOSIS — M542 Cervicalgia: Secondary | ICD-10-CM | POA: Diagnosis not present

## 2020-08-27 DIAGNOSIS — M5451 Vertebrogenic low back pain: Secondary | ICD-10-CM | POA: Diagnosis not present

## 2020-09-08 ENCOUNTER — Other Ambulatory Visit: Payer: Self-pay | Admitting: *Deleted

## 2020-09-08 DIAGNOSIS — Z1231 Encounter for screening mammogram for malignant neoplasm of breast: Secondary | ICD-10-CM

## 2020-09-09 DIAGNOSIS — N63 Unspecified lump in unspecified breast: Secondary | ICD-10-CM | POA: Diagnosis not present

## 2020-09-09 DIAGNOSIS — M545 Low back pain, unspecified: Secondary | ICD-10-CM | POA: Diagnosis not present

## 2020-09-09 DIAGNOSIS — R42 Dizziness and giddiness: Secondary | ICD-10-CM | POA: Diagnosis not present

## 2020-09-12 ENCOUNTER — Other Ambulatory Visit: Payer: Self-pay | Admitting: *Deleted

## 2020-09-12 DIAGNOSIS — N6320 Unspecified lump in the left breast, unspecified quadrant: Secondary | ICD-10-CM

## 2020-09-19 ENCOUNTER — Other Ambulatory Visit: Payer: Medicare Other

## 2020-10-08 DIAGNOSIS — M47816 Spondylosis without myelopathy or radiculopathy, lumbar region: Secondary | ICD-10-CM | POA: Diagnosis not present

## 2020-10-08 DIAGNOSIS — M48061 Spinal stenosis, lumbar region without neurogenic claudication: Secondary | ICD-10-CM | POA: Diagnosis not present

## 2020-10-09 ENCOUNTER — Other Ambulatory Visit: Payer: Self-pay | Admitting: Physical Medicine and Rehabilitation

## 2020-10-09 DIAGNOSIS — M533 Sacrococcygeal disorders, not elsewhere classified: Secondary | ICD-10-CM

## 2020-10-09 DIAGNOSIS — M545 Low back pain, unspecified: Secondary | ICD-10-CM

## 2020-10-15 ENCOUNTER — Encounter: Payer: Self-pay | Admitting: Gastroenterology

## 2020-10-20 DIAGNOSIS — N390 Urinary tract infection, site not specified: Secondary | ICD-10-CM | POA: Diagnosis not present

## 2020-10-20 DIAGNOSIS — M545 Low back pain, unspecified: Secondary | ICD-10-CM | POA: Diagnosis not present

## 2020-10-20 DIAGNOSIS — S0096XA Insect bite (nonvenomous) of unspecified part of head, initial encounter: Secondary | ICD-10-CM | POA: Diagnosis not present

## 2020-10-24 ENCOUNTER — Other Ambulatory Visit: Payer: Self-pay | Admitting: *Deleted

## 2020-10-24 ENCOUNTER — Ambulatory Visit
Admission: RE | Admit: 2020-10-24 | Discharge: 2020-10-24 | Disposition: A | Discharge status: Home / Self Care | Payer: Medicare Other | Source: Ambulatory Visit | Attending: *Deleted | Admitting: *Deleted

## 2020-10-24 ENCOUNTER — Other Ambulatory Visit: Payer: Medicare Other

## 2020-10-24 ENCOUNTER — Other Ambulatory Visit: Payer: Self-pay

## 2020-10-24 ENCOUNTER — Ambulatory Visit
Admission: RE | Admit: 2020-10-24 | Discharge: 2020-10-24 | Disposition: A | Discharge status: Home / Self Care | Payer: Medicare Other | Source: Ambulatory Visit | Attending: Physical Medicine and Rehabilitation | Admitting: Physical Medicine and Rehabilitation

## 2020-10-24 DIAGNOSIS — M47816 Spondylosis without myelopathy or radiculopathy, lumbar region: Secondary | ICD-10-CM | POA: Diagnosis not present

## 2020-10-24 DIAGNOSIS — M545 Low back pain, unspecified: Secondary | ICD-10-CM

## 2020-10-24 DIAGNOSIS — R922 Inconclusive mammogram: Secondary | ICD-10-CM | POA: Diagnosis not present

## 2020-10-24 DIAGNOSIS — M5126 Other intervertebral disc displacement, lumbar region: Secondary | ICD-10-CM | POA: Diagnosis not present

## 2020-10-24 DIAGNOSIS — N6320 Unspecified lump in the left breast, unspecified quadrant: Secondary | ICD-10-CM

## 2020-10-24 DIAGNOSIS — M4319 Spondylolisthesis, multiple sites in spine: Secondary | ICD-10-CM | POA: Diagnosis not present

## 2020-10-24 DIAGNOSIS — M48061 Spinal stenosis, lumbar region without neurogenic claudication: Secondary | ICD-10-CM | POA: Diagnosis not present

## 2020-10-24 DIAGNOSIS — M533 Sacrococcygeal disorders, not elsewhere classified: Secondary | ICD-10-CM

## 2020-10-24 MED ORDER — GADOBENATE DIMEGLUMINE 529 MG/ML IV SOLN
20.0000 mL | Freq: Once | INTRAVENOUS | Status: AC | PRN
  Administered 2020-10-24: 20 mL via INTRAVENOUS

## 2020-10-30 ENCOUNTER — Other Ambulatory Visit: Payer: Self-pay | Admitting: *Deleted

## 2020-10-30 DIAGNOSIS — R921 Mammographic calcification found on diagnostic imaging of breast: Secondary | ICD-10-CM

## 2020-11-03 DIAGNOSIS — Z961 Presence of intraocular lens: Secondary | ICD-10-CM | POA: Diagnosis not present

## 2020-11-03 DIAGNOSIS — H26492 Other secondary cataract, left eye: Secondary | ICD-10-CM | POA: Diagnosis not present

## 2020-11-04 DIAGNOSIS — M533 Sacrococcygeal disorders, not elsewhere classified: Secondary | ICD-10-CM | POA: Diagnosis not present

## 2020-11-04 DIAGNOSIS — M47816 Spondylosis without myelopathy or radiculopathy, lumbar region: Secondary | ICD-10-CM | POA: Diagnosis not present

## 2020-11-13 ENCOUNTER — Ambulatory Visit: Payer: Medicare Other | Admitting: Gastroenterology

## 2020-11-13 ENCOUNTER — Encounter: Payer: Self-pay | Admitting: Gastroenterology

## 2020-11-13 VITALS — BP 130/80 | HR 67 | Ht 61.0 in | Wt 278.0 lb

## 2020-11-13 DIAGNOSIS — R053 Chronic cough: Secondary | ICD-10-CM | POA: Insufficient documentation

## 2020-11-13 DIAGNOSIS — R194 Change in bowel habit: Secondary | ICD-10-CM | POA: Insufficient documentation

## 2020-11-13 DIAGNOSIS — R110 Nausea: Secondary | ICD-10-CM | POA: Diagnosis not present

## 2020-11-13 MED ORDER — PANTOPRAZOLE SODIUM 40 MG PO TBEC: 40.0000 mg | DELAYED_RELEASE_TABLET | Freq: Two times a day (BID) | ORAL | 5 refills | Status: DC

## 2020-11-13 MED ORDER — FAMOTIDINE 20 MG PO TABS: 20.0000 mg | ORAL_TABLET | Freq: Every day | ORAL | 5 refills | Status: DC

## 2020-11-13 NOTE — Progress Notes (Signed)
11/13/2020 Jessica Good [id number] 12-Dec-1950   HISTORY OF PRESENT ILLNESS:  This is a 70 year old female who is a patient of Dr. Vena Rua.  She is here today with a couple different complaints.  First, she complains of a chronic constant cough.  She says that is nonproductive, it is there all the time.  She also reports some epigastric discomfort and nausea.  She is not on any type of antireflux regimen.  She has Carafate suspension/slurry that she uses on occasion.  She says that she feels like it is related to her reflux because when she starts coughing if she takes an antacid that seems to help.  Her last EGD in April 2018 was unremarkable except for a small (5-46mm) subepithelial, smooth lesion in the distal body of the stomach, along the lesser curvature. This appeared unchaged endoscopically compared to 2 years prior and EUS showed no changes so no further follow-up was recommended after that.  She also describes having changes in her bowel habits on and off.  She says that sometimes her stools are normal caliber and sometimes they are smaller in caliber.  She also has occasional sensation of incomplete emptying of her stools.  Her last colonoscopy in April 2018 was normal, but repeat was recommended in 5 years due to history of adenomatous colon polyps.   Past Medical History:  Diagnosis Date   Adenomatous colon polyp    Anemia yrs ago   PMH   Anxiety    Cataract    Constipation due to pain medication    Edema of lower extremity    Takes lasix PRN   Gallstones    Gastritis    GERD (gastroesophageal reflux disease)    with meds   Hiatal hernia    History of recurrent UTIs    Hyperlipidemia    Hypertension    IBS (irritable bowel syndrome)    Interstitial cystitis    Loose, teeth    in the front of mouth   Osteoarthritis    Osteoporosis    osteoarthritis   Pre-diabetes    Pseudogout    Urgency of urination    Past Surgical History:  Procedure Laterality Date    ANKLE SURGERY Right    BREAST LUMPECTOMY Left 1996   fibroid   cataracts Bilateral    lens implanted   COLONOSCOPY W/ POLYPECTOMY     COLONOSCOPY WITH PROPOFOL N/A 09/02/2016   Procedure: COLONOSCOPY WITH PROPOFOL;  Surgeon: Milus Banister, MD;  [facility];  Service: Endoscopy;  Laterality: N/A;   EUS N/A 02/06/2015   Procedure: UPPER ENDOSCOPIC ULTRASOUND (EUS) LINEAR;  Surgeon: Milus Banister, MD;  [facility];  Service: Endoscopy;  Laterality: N/A;   EUS N/A 09/02/2016   Procedure: UPPER ENDOSCOPIC ULTRASOUND (EUS) LINEAR;  Surgeon: Milus Banister, MD;  [facility];  Service: Endoscopy;  Laterality: N/A;   EYE SURGERY Bilateral    lasix 14 yrs ago   JOINT REPLACEMENT     PARTIAL KNEE ARTHROPLASTY Left 11/26/2014   Procedure: LEFT UNICOMPARTMENTAL KNEE;  Surgeon: Renette Butters, MD;  Location: [place];  Service: Orthopedics;  Laterality: Left;   TOTAL KNEE ARTHROPLASTY Right 04/09/2014   DR MURPHY   TOTAL KNEE ARTHROPLASTY Right 04/09/2014   Procedure: TOTAL KNEE ARTHROPLASTY;  Surgeon: Renette Butters, MD;  Location: [place];  Service: Orthopedics;  Laterality: Right;   TOTAL KNEE REVISION Left 11/03/2015   Procedure: TOTAL KNEE REVISION;  Surgeon:  Renette Butters, MD;  Location: [place];  Service: Orthopedics;  Laterality: Left;    reports that she has never smoked. She has never used smokeless tobacco. She reports that she does not drink alcohol and does not use drugs. family history includes Breast cancer (age of onset: 66) in her maternal aunt; Colon polyps in her father; Diabetes in her father, maternal grandmother, and mother; Heart disease in her father; Hypertension in her father and mother; Pancreatic cancer in her mother. Allergies  Allergen Reactions   Demerol [Meperidine] Other (See Comments)    caused excessive sleepiness   Latex Rash    Pt. Stated she has a " latex allergy" and it "gives her a rash".   Sulfa Antibiotics Other (See  Comments)    : Flu-like symptoms   Adhesive [Tape] Rash    Reaction to bandaids      Outpatient Encounter Medications as of 11/13/2020  Medication Sig   acetaminophen (TYLENOL) 500 MG tablet Take 500 mg by mouth every 6 (six) hours as needed for moderate pain.   amoxicillin (AMOXIL) 500 MG capsule Take 1 capsule by mouth daily. To prevent UTI's   aspirin 81 MG tablet Take 81 mg by mouth daily.   baclofen (LIORESAL) 10 MG tablet Take 1 tablet by mouth daily.   cholecalciferol (VITAMIN D) 1000 UNITS tablet Take 2,000 Units by mouth daily.    citalopram (CELEXA) 40 MG tablet Take 1 tablet by mouth daily.   furosemide (LASIX) 40 MG tablet Take 40 mg by mouth daily as needed for fluid.    ibuprofen (ADVIL,MOTRIN) 800 MG tablet Take 800 mg by mouth every 8 (eight) hours as needed.   lisinopril (PRINIVIL,ZESTRIL) 20 MG tablet Take 1 tablet (20 mg total) by mouth 2 (two) times daily.   Multiple Vitamin (MULTIVITAMIN WITH MINERALS) TABS Take 1 tablet by mouth daily.   PROLIA 60 MG/ML SOSY injection Inject into the skin. Injection done every 6 months   sucralfate (CARAFATE) 1 g tablet Take 1 g by mouth 3 (three) times daily as needed (ulcer).    sucralfate (CARAFATE) 1 g tablet Take 1 tablet (1 g total) by mouth 4 (four) times daily -  with meals and at bedtime.   thiamine (VITAMIN B-1) 100 MG tablet Take 100 mg by mouth daily.   vitamin C (ASCORBIC ACID) 500 MG tablet Take 500 mg by mouth daily.   [DISCONTINUED] Teriparatide, Recombinant, (FORTEO) 600 MCG/2.4ML SOLN Inject 0.08 mLs (20 mcg total) into the skin daily.   [DISCONTINUED] vitamin B-12 (CYANOCOBALAMIN) 500 MCG tablet Take 500 mcg by mouth daily.   No facility-administered encounter medications on file as of 11/13/2020.     REVIEW OF SYSTEMS  : All other systems reviewed and negative except where noted in the History of Present Illness.   PHYSICAL EXAM: BP 130/80   Pulse 67   Ht 5\' 1"  (1.549 m)   Wt 278 lb (126.1 kg)   BMI 52.53  kg/m  General: Well developed white female in no acute distress Head: Normocephalic and atraumatic Eyes:  Sclerae anicteric, conjunctiva pink. Ears: Normal auditory acuity Lungs: Clear throughout to auscultation; no W/R/R. Heart: Regular rate and rhythm; no M/R/G. Abdomen: Soft, non-distended.  BS present.  Mild epigastric TTP. Musculoskeletal: Symmetrical with no gross deformities  Skin: No lesions on visible extremities Extremities: No edema  Neurological: Alert oriented x 4, grossly non-focal Psychological:  Alert and cooperative. Normal mood and affect  ASSESSMENT AND PLAN: *Chronic cough and nausea: Reports  a constant nonproductive cough as well as some nausea and discomfort in her epigastrium.  She is not currently on any acid reflux regimen.  Has Carafate suspension, which she uses on occasion.  I am going to put her on a max acid reflux regimen with pantoprazole 40 mg twice daily and Pepcid 20 mg at bedtime.  Prescription sent to pharmacy.  If no improvement then can consider repeat EGD (last 08/2016 was fine). *Altered bowel habits: Sometimes has changes in caliber of her stool.  Sometimes normal and sometimes smaller caliber.  Sometimes sensation of incomplete emptying.  We will have her start Benefiber 2 teaspoons mixed in 8 ounces of liquid daily and MiraLAX 1 capful mixed in 8 ounces of liquid daily.  Last colonoscopy April 2018 was normal, due 08/2021 for past history of colon polyps.  **She will follow-up with me in about 4 to 6 weeks.   CC:  Everardo Beals, NP

## 2020-11-13 NOTE — Patient Instructions (Signed)
We have sent the following medications to your pharmacy for you to pick up at your convenience: Pantoprazole 40 mg twice daily 30-60 minutes before breakfast and dinner. Pepcid 20 mg 30 minutes before bedtime.   Start Benefiber 2 teaspoons in 8 ounces of liquid daily.  Start Miralax 1 capful in 8 ounces of liquid daily.   If you are age 72 or older, your body mass index should be between 23-30. Your Body mass index is 52.53 kg/m. If this is out of the aforementioned range listed, please consider follow up with your Primary Care Provider.  If you are age 76 or younger, your body mass index should be between 19-25. Your Body mass index is 52.53 kg/m. If this is out of the aformentioned range listed, please consider follow up with your Primary Care Provider.   __________________________________________________________  The [facility] providers would like to encourage you to use [facility] to communicate with providers for non-urgent requests or questions.  Due to long hold times on the telephone, sending your provider a message by [facility] may be a faster and more efficient way to get a response.  Please allow 48 business hours for a response.  Please remember that this is for non-urgent requests.

## 2020-11-17 DIAGNOSIS — M533 Sacrococcygeal disorders, not elsewhere classified: Secondary | ICD-10-CM | POA: Diagnosis not present

## 2020-11-19 DIAGNOSIS — H26492 Other secondary cataract, left eye: Secondary | ICD-10-CM | POA: Diagnosis not present

## 2020-12-03 ENCOUNTER — Telehealth: Payer: Self-pay | Admitting: Gastroenterology

## 2020-12-03 NOTE — Telephone Encounter (Signed)
Inbound call from patient. Requesting to discuss current medications. States it is making her breaking out in rash on both arms. Best contact number [phone]

## 2020-12-03 NOTE — Telephone Encounter (Signed)
The pt began taking pantoprazole and 3-4 days later she developed a rash on both arms from the wrist to the elbow.  She states it is not covering the arms but there are some areas on both arms.  She describes it as red, raised and very itchy.  She has been advised to stop the pantoprazole and take benadryl.  She will call PCP is the rash does not resolve.  I will also send to Janett Billow (pt is aware that Janett Billow is out of the office until next week) Janett Billow please advise

## 2020-12-05 NOTE — Progress Notes (Signed)
Addendum: Reviewed and agree with assessment and management plan. Talin Rozeboom M, MD  

## 2020-12-08 DIAGNOSIS — M533 Sacrococcygeal disorders, not elsewhere classified: Secondary | ICD-10-CM | POA: Diagnosis not present

## 2020-12-08 DIAGNOSIS — M47816 Spondylosis without myelopathy or radiculopathy, lumbar region: Secondary | ICD-10-CM | POA: Diagnosis not present

## 2020-12-09 NOTE — Telephone Encounter (Signed)
I spoke with the pt and she states she still has the rash on both arms.  She has an appt tomorrow with PCP.  She will have them send the office note for review.

## 2020-12-10 DIAGNOSIS — M1990 Unspecified osteoarthritis, unspecified site: Secondary | ICD-10-CM | POA: Diagnosis not present

## 2020-12-10 DIAGNOSIS — E119 Type 2 diabetes mellitus without complications: Secondary | ICD-10-CM | POA: Diagnosis not present

## 2020-12-10 DIAGNOSIS — L282 Other prurigo: Secondary | ICD-10-CM | POA: Diagnosis not present

## 2020-12-11 ENCOUNTER — Ambulatory Visit: Payer: Self-pay | Admitting: Gastroenterology

## 2020-12-11 ENCOUNTER — Telehealth: Payer: Self-pay | Admitting: Gastroenterology

## 2020-12-11 NOTE — Telephone Encounter (Signed)
Pt was scheduled to see Janett Billow this morning but called to cancel because she got up feeling dizzy. She stated that her appt was to check how a medication was working for her. Pt would like to speak with Jessica's nurse to give an update on that.

## 2020-12-11 NOTE — Telephone Encounter (Signed)
Tried to reach the pt by phone and the voice mail is full.  Will try later

## 2020-12-12 NOTE — Telephone Encounter (Signed)
Left message on machine to call back  

## 2020-12-12 NOTE — Telephone Encounter (Signed)
Error mailbox full

## 2020-12-15 NOTE — Telephone Encounter (Signed)
I spoke with the pt and she tells me she is doing much better and is following up with PCP in regards to rash.  She will call back if she has any further complaints.  I have added pantoprazole to her allergy list.

## 2020-12-21 DIAGNOSIS — L282 Other prurigo: Secondary | ICD-10-CM | POA: Diagnosis not present

## 2020-12-21 DIAGNOSIS — M79671 Pain in right foot: Secondary | ICD-10-CM | POA: Diagnosis not present

## 2021-01-27 ENCOUNTER — Other Ambulatory Visit: Payer: Self-pay | Admitting: Sports Medicine

## 2021-01-27 ENCOUNTER — Other Ambulatory Visit: Payer: Self-pay | Admitting: Student

## 2021-01-27 DIAGNOSIS — M810 Age-related osteoporosis without current pathological fracture: Secondary | ICD-10-CM

## 2021-01-27 DIAGNOSIS — E559 Vitamin D deficiency, unspecified: Secondary | ICD-10-CM | POA: Diagnosis not present

## 2021-01-27 DIAGNOSIS — R5383 Other fatigue: Secondary | ICD-10-CM | POA: Diagnosis not present

## 2021-03-17 DIAGNOSIS — N3010 Interstitial cystitis (chronic) without hematuria: Secondary | ICD-10-CM | POA: Diagnosis not present

## 2021-03-17 DIAGNOSIS — R233 Spontaneous ecchymoses: Secondary | ICD-10-CM | POA: Diagnosis not present

## 2021-05-13 DIAGNOSIS — R053 Chronic cough: Secondary | ICD-10-CM | POA: Diagnosis not present

## 2021-05-13 DIAGNOSIS — H9201 Otalgia, right ear: Secondary | ICD-10-CM | POA: Diagnosis not present

## 2021-05-13 DIAGNOSIS — H9191 Unspecified hearing loss, right ear: Secondary | ICD-10-CM | POA: Diagnosis not present

## 2021-05-13 DIAGNOSIS — M25512 Pain in left shoulder: Secondary | ICD-10-CM | POA: Diagnosis not present

## 2021-05-26 DIAGNOSIS — H524 Presbyopia: Secondary | ICD-10-CM | POA: Diagnosis not present

## 2021-05-26 DIAGNOSIS — Z961 Presence of intraocular lens: Secondary | ICD-10-CM | POA: Diagnosis not present

## 2021-07-10 ENCOUNTER — Other Ambulatory Visit: Payer: Medicare Other

## 2021-07-17 DIAGNOSIS — N390 Urinary tract infection, site not specified: Secondary | ICD-10-CM | POA: Diagnosis not present

## 2021-07-17 DIAGNOSIS — J209 Acute bronchitis, unspecified: Secondary | ICD-10-CM | POA: Diagnosis not present

## 2021-07-20 ENCOUNTER — Encounter: Payer: Self-pay | Admitting: Endocrinology

## 2021-07-20 ENCOUNTER — Ambulatory Visit: Payer: Medicare Other | Admitting: Endocrinology

## 2021-07-20 ENCOUNTER — Other Ambulatory Visit: Payer: Self-pay

## 2021-07-20 DIAGNOSIS — E042 Nontoxic multinodular goiter: Secondary | ICD-10-CM | POA: Diagnosis not present

## 2021-07-20 NOTE — Progress Notes (Unsigned)
° °  Subjective:    Patient ID: Jessica Good, female    DOB: 06/19/1950, 71 y.o.   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  She does not notice the goiter.  Pt says she sometimes has solid dysphagia at the level of the thyroid.     Review of Systems Denies sob    Objective:   Physical Exam VITAL SIGNS:  See vs page.   GENERAL: no distress.   NECK: I think I can feel the top of a goiter, but I am not sure.       Assessment & Plan:

## 2021-07-20 NOTE — Patient Instructions (Addendum)
Let's recheck the ultrasound.  you will receive a phone call, about a day and time for an appointment. ?Please make sure the thyroid is included in your upcoming routine blood tests.  Please also ask that a copy be sent here.   ?Changing the blood pressure pill sometimes gets rid of the cough, and therefore the difficulty swallowing.  If not, please let us know, so we can refer you to specialist.   ?Please come back for a follow-up appointment in 1 year.   ? ?

## 2021-07-22 DIAGNOSIS — Z79899 Other long term (current) drug therapy: Secondary | ICD-10-CM | POA: Diagnosis not present

## 2021-07-22 DIAGNOSIS — E559 Vitamin D deficiency, unspecified: Secondary | ICD-10-CM | POA: Diagnosis not present

## 2021-07-22 DIAGNOSIS — R5383 Other fatigue: Secondary | ICD-10-CM | POA: Diagnosis not present

## 2021-07-22 DIAGNOSIS — M810 Age-related osteoporosis without current pathological fracture: Secondary | ICD-10-CM | POA: Diagnosis not present

## 2021-07-24 ENCOUNTER — Ambulatory Visit
Admission: RE | Admit: 2021-07-24 | Discharge: 2021-07-24 | Disposition: A | Discharge status: Home / Self Care | Payer: Medicare Other | Source: Ambulatory Visit | Attending: Endocrinology | Admitting: Endocrinology

## 2021-07-24 ENCOUNTER — Encounter: Payer: Self-pay | Admitting: Endocrinology

## 2021-07-24 DIAGNOSIS — E041 Nontoxic single thyroid nodule: Secondary | ICD-10-CM | POA: Diagnosis not present

## 2021-07-24 DIAGNOSIS — E042 Nontoxic multinodular goiter: Secondary | ICD-10-CM

## 2021-07-29 ENCOUNTER — Other Ambulatory Visit: Payer: Self-pay | Admitting: Endocrinology

## 2021-07-29 DIAGNOSIS — E042 Nontoxic multinodular goiter: Secondary | ICD-10-CM

## 2021-07-29 DIAGNOSIS — M25552 Pain in left hip: Secondary | ICD-10-CM | POA: Diagnosis not present

## 2021-07-29 DIAGNOSIS — M810 Age-related osteoporosis without current pathological fracture: Secondary | ICD-10-CM | POA: Diagnosis not present

## 2021-08-03 ENCOUNTER — Other Ambulatory Visit: Payer: Self-pay | Admitting: Endocrinology

## 2021-08-03 DIAGNOSIS — E042 Nontoxic multinodular goiter: Secondary | ICD-10-CM

## 2021-08-17 ENCOUNTER — Ambulatory Visit
Admission: RE | Admit: 2021-08-17 | Discharge: 2021-08-17 | Disposition: A | Discharge status: Home / Self Care | Payer: Medicare Other | Source: Ambulatory Visit | Attending: Sports Medicine | Admitting: Sports Medicine

## 2021-08-17 DIAGNOSIS — M810 Age-related osteoporosis without current pathological fracture: Secondary | ICD-10-CM

## 2021-08-17 DIAGNOSIS — M8589 Other specified disorders of bone density and structure, multiple sites: Secondary | ICD-10-CM | POA: Diagnosis not present

## 2021-08-17 DIAGNOSIS — Z780 Asymptomatic menopausal state: Secondary | ICD-10-CM | POA: Diagnosis not present

## 2021-08-18 ENCOUNTER — Other Ambulatory Visit (HOSPITAL_COMMUNITY)
Admission: RE | Admit: 2021-08-18 | Discharge: 2021-08-18 | Disposition: A | Discharge status: Home / Self Care | Payer: Medicare Other | Source: Ambulatory Visit | Attending: Physician Assistant | Admitting: Physician Assistant

## 2021-08-18 ENCOUNTER — Ambulatory Visit
Admission: RE | Admit: 2021-08-18 | Discharge: 2021-08-18 | Disposition: A | Discharge status: Home / Self Care | Payer: Medicare Other | Source: Ambulatory Visit | Attending: Endocrinology | Admitting: Endocrinology

## 2021-08-18 DIAGNOSIS — E042 Nontoxic multinodular goiter: Secondary | ICD-10-CM

## 2021-08-18 DIAGNOSIS — E041 Nontoxic single thyroid nodule: Secondary | ICD-10-CM | POA: Diagnosis not present

## 2021-08-20 ENCOUNTER — Encounter: Payer: Self-pay | Admitting: Endocrinology

## 2021-08-20 LAB — CYTOLOGY - NON PAP

## 2021-08-21 ENCOUNTER — Encounter: Payer: Self-pay | Admitting: Endocrinology

## 2021-09-09 DIAGNOSIS — E042 Nontoxic multinodular goiter: Secondary | ICD-10-CM | POA: Diagnosis not present

## 2021-09-09 DIAGNOSIS — E1165 Type 2 diabetes mellitus with hyperglycemia: Secondary | ICD-10-CM | POA: Diagnosis not present

## 2021-09-09 DIAGNOSIS — I10 Essential (primary) hypertension: Secondary | ICD-10-CM | POA: Diagnosis not present

## 2021-09-09 DIAGNOSIS — L659 Nonscarring hair loss, unspecified: Secondary | ICD-10-CM | POA: Diagnosis not present

## 2021-10-28 ENCOUNTER — Encounter: Payer: Self-pay | Admitting: Internal Medicine

## 2021-11-16 ENCOUNTER — Encounter: Payer: Self-pay | Admitting: Internal Medicine

## 2021-11-27 ENCOUNTER — Telehealth: Payer: Self-pay | Admitting: *Deleted

## 2021-11-27 DIAGNOSIS — L821 Other seborrheic keratosis: Secondary | ICD-10-CM | POA: Diagnosis not present

## 2021-11-27 DIAGNOSIS — D225 Melanocytic nevi of trunk: Secondary | ICD-10-CM | POA: Diagnosis not present

## 2021-11-27 DIAGNOSIS — D692 Other nonthrombocytopenic purpura: Secondary | ICD-10-CM | POA: Diagnosis not present

## 2021-11-27 DIAGNOSIS — L820 Inflamed seborrheic keratosis: Secondary | ICD-10-CM | POA: Diagnosis not present

## 2021-11-27 DIAGNOSIS — D485 Neoplasm of uncertain behavior of skin: Secondary | ICD-10-CM | POA: Diagnosis not present

## 2021-11-27 DIAGNOSIS — D3617 Benign neoplasm of peripheral nerves and autonomic nervous system of trunk, unspecified: Secondary | ICD-10-CM | POA: Diagnosis not present

## 2021-11-27 DIAGNOSIS — D2261 Melanocytic nevi of right upper limb, including shoulder: Secondary | ICD-10-CM | POA: Diagnosis not present

## 2021-11-27 DIAGNOSIS — D1801 Hemangioma of skin and subcutaneous tissue: Secondary | ICD-10-CM | POA: Diagnosis not present

## 2021-11-27 DIAGNOSIS — L918 Other hypertrophic disorders of the skin: Secondary | ICD-10-CM | POA: Diagnosis not present

## 2021-11-27 DIAGNOSIS — B078 Other viral warts: Secondary | ICD-10-CM | POA: Diagnosis not present

## 2021-11-27 DIAGNOSIS — D2271 Melanocytic nevi of right lower limb, including hip: Secondary | ICD-10-CM | POA: Diagnosis not present

## 2021-11-27 NOTE — Telephone Encounter (Signed)
Dr Hilarie Fredrickson,  This pt has scheduled a recall colon with you for a hx of colon polyps-  she has a BMI of 50.6- I spoke with her and she states the BMI is that or more- Do you want her to have an OV or direct at [facility]?Marland Kitchen   Please advise- I canceled her PV for Monday 7-24 and her colon for 8-14    [facility]

## 2021-11-27 NOTE — Telephone Encounter (Signed)
Can be direct, thank you

## 2021-11-27 NOTE — Telephone Encounter (Signed)
Vaughan Basta,  Can you please schedule this pt for a colon at [facility] due to BMI > 50 per note below per Dr Mikki Santee PV

## 2021-12-08 ENCOUNTER — Other Ambulatory Visit: Payer: Self-pay

## 2021-12-08 DIAGNOSIS — Z86010 Personal history of colonic polyps: Secondary | ICD-10-CM

## 2021-12-08 DIAGNOSIS — R0789 Other chest pain: Secondary | ICD-10-CM | POA: Diagnosis not present

## 2021-12-08 DIAGNOSIS — E1165 Type 2 diabetes mellitus with hyperglycemia: Secondary | ICD-10-CM | POA: Diagnosis not present

## 2021-12-08 DIAGNOSIS — R300 Dysuria: Secondary | ICD-10-CM | POA: Diagnosis not present

## 2021-12-08 DIAGNOSIS — M25512 Pain in left shoulder: Secondary | ICD-10-CM | POA: Diagnosis not present

## 2021-12-08 DIAGNOSIS — H9201 Otalgia, right ear: Secondary | ICD-10-CM | POA: Diagnosis not present

## 2021-12-08 NOTE — Telephone Encounter (Signed)
Called pt- Notified her of her PV date and her colon date at [facility]- also mailed her an AVS with the dates ,times ,locations, etc - pt verbalized understanding- told her to call and RS if these end up not working for her

## 2021-12-08 NOTE — Telephone Encounter (Signed)
FYI

## 2021-12-08 NOTE — Telephone Encounter (Signed)
Pt scheduled for previsit 01/25/22 at 11am, colon scheduled at [facility] [place] 02/11/22 at 9:30am. Case number [id number]. Please let pt know the new appt dates and times.

## 2021-12-16 ENCOUNTER — Encounter (INDEPENDENT_AMBULATORY_CARE_PROVIDER_SITE_OTHER): Payer: Self-pay

## 2021-12-21 ENCOUNTER — Encounter: Payer: Medicare Other | Admitting: Internal Medicine

## 2022-01-05 DIAGNOSIS — Z0289 Encounter for other administrative examinations: Secondary | ICD-10-CM

## 2022-01-12 ENCOUNTER — Ambulatory Visit (INDEPENDENT_AMBULATORY_CARE_PROVIDER_SITE_OTHER): Payer: Medicare Other | Admitting: Family Medicine

## 2022-01-12 ENCOUNTER — Encounter (INDEPENDENT_AMBULATORY_CARE_PROVIDER_SITE_OTHER): Payer: Self-pay | Admitting: Family Medicine

## 2022-01-12 VITALS — BP 121/77 | HR 65 | Temp 97.5°F | Ht 62.0 in | Wt 267.0 lb

## 2022-01-12 DIAGNOSIS — Z6841 Body Mass Index (BMI) 40.0 and over, adult: Secondary | ICD-10-CM

## 2022-01-12 DIAGNOSIS — E1169 Type 2 diabetes mellitus with other specified complication: Secondary | ICD-10-CM

## 2022-01-12 DIAGNOSIS — I10 Essential (primary) hypertension: Secondary | ICD-10-CM | POA: Diagnosis not present

## 2022-01-12 DIAGNOSIS — Z72820 Sleep deprivation: Secondary | ICD-10-CM

## 2022-01-12 DIAGNOSIS — F411 Generalized anxiety disorder: Secondary | ICD-10-CM | POA: Diagnosis not present

## 2022-01-12 DIAGNOSIS — M810 Age-related osteoporosis without current pathological fracture: Secondary | ICD-10-CM

## 2022-01-12 DIAGNOSIS — R5383 Other fatigue: Secondary | ICD-10-CM

## 2022-01-12 DIAGNOSIS — E119 Type 2 diabetes mellitus without complications: Secondary | ICD-10-CM | POA: Insufficient documentation

## 2022-01-12 DIAGNOSIS — R0602 Shortness of breath: Secondary | ICD-10-CM

## 2022-01-12 DIAGNOSIS — Z1331 Encounter for screening for depression: Secondary | ICD-10-CM

## 2022-01-12 DIAGNOSIS — E669 Obesity, unspecified: Secondary | ICD-10-CM

## 2022-01-13 LAB — TSH: TSH: 4.52 u[IU]/mL — ABNORMAL HIGH (ref 0.450–4.500)

## 2022-01-13 LAB — INSULIN, RANDOM: INSULIN: 13.5 u[IU]/mL (ref 2.6–24.9)

## 2022-01-13 LAB — CBC WITH DIFFERENTIAL/PLATELET
Basophils Absolute: 0 10*3/uL (ref 0.0–0.2)
Basos: 1 %
EOS (ABSOLUTE): 0.1 10*3/uL (ref 0.0–0.4)
Eos: 1 %
Hematocrit: 42.2 % (ref 34.0–46.6)
Hemoglobin: 13.9 g/dL (ref 11.1–15.9)
Immature Grans (Abs): 0 10*3/uL (ref 0.0–0.1)
Immature Granulocytes: 0 %
Lymphocytes Absolute: 1.4 10*3/uL (ref 0.7–3.1)
Lymphs: 33 %
MCH: 31 pg (ref 26.6–33.0)
MCHC: 32.9 g/dL (ref 31.5–35.7)
MCV: 94 fL (ref 79–97)
Monocytes Absolute: 0.3 10*3/uL (ref 0.1–0.9)
Monocytes: 6 %
Neutrophils Absolute: 2.6 10*3/uL (ref 1.4–7.0)
Neutrophils: 59 %
Platelets: 241 10*3/uL (ref 150–450)
RBC: 4.48 x10E6/uL (ref 3.77–5.28)
RDW: 12.3 % (ref 11.7–15.4)
WBC: 4.4 10*3/uL (ref 3.4–10.8)

## 2022-01-13 LAB — COMPREHENSIVE METABOLIC PANEL
ALT: 10 IU/L (ref 0–32)
AST: 22 IU/L (ref 0–40)
Albumin/Globulin Ratio: 1.9 (ref 1.2–2.2)
Albumin: 4.3 g/dL (ref 3.8–4.8)
Alkaline Phosphatase: 85 IU/L (ref 44–121)
BUN/Creatinine Ratio: 12 (ref 12–28)
BUN: 11 mg/dL (ref 8–27)
Bilirubin Total: 0.5 mg/dL (ref 0.0–1.2)
CO2: 25 mmol/L (ref 20–29)
Calcium: 9.4 mg/dL (ref 8.7–10.3)
Chloride: 99 mmol/L (ref 96–106)
Creatinine, Ser: 0.89 mg/dL (ref 0.57–1.00)
Globulin, Total: 2.3 g/dL (ref 1.5–4.5)
Glucose: 159 mg/dL — ABNORMAL HIGH (ref 70–99)
Potassium: 4 mmol/L (ref 3.5–5.2)
Sodium: 140 mmol/L (ref 134–144)
Total Protein: 6.6 g/dL (ref 6.0–8.5)
eGFR: 69 mL/min/{1.73_m2} (ref >59)

## 2022-01-13 LAB — VITAMIN D 25 HYDROXY (VIT D DEFICIENCY, FRACTURES): Vit D, 25-Hydroxy: 58.5 ng/mL (ref 30.0–100.0)

## 2022-01-13 LAB — LIPID PANEL
Chol/HDL Ratio: 3.4 ratio (ref 0.0–4.4)
Cholesterol, Total: 198 mg/dL (ref 100–199)
HDL: 59 mg/dL (ref >39)
LDL Chol Calc (NIH): 123 mg/dL — ABNORMAL HIGH (ref 0–99)
Triglycerides: 86 mg/dL (ref 0–149)
VLDL Cholesterol Cal: 16 mg/dL (ref 5–40)

## 2022-01-13 LAB — HEMOGLOBIN A1C
Est. average glucose Bld gHb Est-mCnc: 166 mg/dL
Hgb A1c MFr Bld: 7.4 % — ABNORMAL HIGH (ref 4.8–5.6)

## 2022-01-13 LAB — T4, FREE: Free T4: 1.02 ng/dL (ref 0.82–1.77)

## 2022-01-13 LAB — VITAMIN B12: Vitamin B-12: 858 pg/mL (ref 232–1245)

## 2022-01-21 NOTE — Progress Notes (Unsigned)
Chief Complaint:   OBESITY Jessica Good (MR# [id number]) is a 71 y.o. female who presents for evaluation and treatment of obesity and related comorbidities. Current BMI is Body mass index is 48.83 kg/m. Jessica Good has been struggling with her weight for many years and has been unsuccessful in either losing weight, maintaining weight loss, or reaching her healthy weight goal.  Jessica Good's sister passed away when she was part of the program back in 2020.  She has been less active due to osteoporosis diagnosis and joint pain.  She started watering the garden, and she likes to walk and swim (has stopped).  Portion sizes are bigger.  She is eating snacks for lunch and after dinner.  She craves more sweets, and tends to boredom eat and she is eating out more for dinner.  Jessica Good is currently in the action stage of change and ready to dedicate time achieving and maintaining a healthier weight. Jessica Good is interested in becoming our patient and working on intensive lifestyle modifications including (but not limited to) diet and exercise for weight loss.  Jessica Good's habits were reviewed today and are as follows: {MWM WT HABITS:23461}.  Depression Screen Jessica Good's Food and Mood (modified PHQ-9) score was 16.     01/12/2022    9:09 AM  Depression screen PHQ 2/9  Decreased Interest 2  Down, Depressed, Hopeless 2  PHQ - 2 Score 4  Altered sleeping 2  Tired, decreased energy 3  Change in appetite 2  Feeling bad or failure about yourself  1  Trouble concentrating 2  Moving slowly or fidgety/restless 1  Suicidal thoughts 1  PHQ-9 Score 16  Difficult doing work/chores Somewhat difficult   Subjective:   1. Other fatigue Jessica Good admits to daytime somnolence and admits to waking up still tired. Patient has a history of symptoms of daytime fatigue and morning fatigue. Jessica Good generally gets 6 hours of sleep per night, and states that she has nightime awakenings. Snoring is not present. Apneic episodes are  not present. Epworth Sleepiness Score is 11.   2. SOBOE (shortness of breath on exertion) Jessica Good notes increasing shortness of breath with exercising and seems to be worsening over time with weight gain. She notes getting out of breath sooner with activity than she used to. This has not gotten worse recently. Jessica Good denies shortness of breath at rest or orthopnea.  3. Essential hypertension ***  4. GAD (generalized anxiety disorder) ***  5. Age-related osteoporosis without current pathological fracture ***  6. Type 2 diabetes mellitus with other specified complication, without long-term current use of insulin (HCC) ***  7. Poor sleep ***  Assessment/Plan:   1. Other fatigue Jessica Good does feel that her weight is causing her energy to be lower than it should be. Fatigue may be related to obesity, depression or many other causes. Labs will be ordered, and in the meanwhile, Jessica Good will focus on self care including making healthy food choices, increasing physical activity and focusing on stress reduction.  - EKG 12-Lead - Vitamin B12 - CBC with Differential/Platelet - Comprehensive metabolic panel - Lipid panel - T4, free - TSH  2. SOBOE (shortness of breath on exertion) Jessica Good does feel that she gets out of breath more easily that she used to when she exercises. Jessica Good's shortness of breath appears to be obesity related and exercise induced. She has agreed to work on weight loss and gradually increase exercise to treat her exercise induced shortness of breath. Will continue to monitor closely.  3. Essential hypertension ***  4. GAD (generalized anxiety disorder) ***  5. Age-related osteoporosis without current pathological fracture *** - VITAMIN D 25 Hydroxy (Vit-D Deficiency, Fractures)  6. Type 2 diabetes mellitus with other specified complication, without long-term current use of insulin (HCC) *** - Hemoglobin A1c - Insulin, random  7. Poor sleep ***  8. Depression  screening Jessica Good had a positive depression screening. Depression is commonly associated with obesity and often results in emotional eating behaviors. We will monitor this closely and work on CBT to help improve the non-hunger eating patterns. Referral to Psychology may be required if no improvement is seen as she continues in our clinic.  9. Obesity, current BMI 48.9 Jessica Good is currently in the action stage of change and her goal is to continue with weight loss efforts. I recommend Jessica Good begin the structured treatment plan as follows:  She has agreed to the Category 1 Plan with 80-90 grams of protein daily.  Exercise goals: All adults should avoid inactivity. Some physical activity is better than none, and adults who participate in any amount of physical activity gain some health benefits.   Behavioral modification strategies: increasing lean protein intake, increasing vegetables, increasing water intake, decreasing liquid calories, decreasing eating out, no skipping meals, better snacking choices, and decreasing junk food.  She was informed of the importance of frequent follow-up visits to maximize her success with intensive lifestyle modifications for her multiple health conditions. She was informed we would discuss her lab results at her next visit unless there is a critical issue that needs to be addressed sooner. Jessica Good agreed to keep her next visit at the agreed upon time to discuss these results.  Objective:   Blood pressure 121/77, pulse 65, temperature (!) 97.5 F (36.4 C), height '5\' 2"'$  (1.575 m), weight 267 lb (121.1 kg), SpO2 96 %. Body mass index is 48.83 kg/m.  EKG: Normal sinus rhythm, rate 68 BPM.  Indirect Calorimeter completed today shows a VO2 of 196 and a REE of 1354.  Her calculated basal metabolic rate is 5277 thus her basal metabolic rate is worse than expected.  General: Cooperative, alert, well developed, in no acute distress. HEENT: Conjunctivae and lids  unremarkable. Cardiovascular: Regular rhythm.  Lungs: Normal work of breathing. Neurologic: No focal deficits.   Lab Results  Component Value Date   CREATININE 0.89 01/12/2022   BUN 11 01/12/2022   NA 140 01/12/2022   K 4.0 01/12/2022   CL 99 01/12/2022   CO2 25 01/12/2022   Lab Results  Component Value Date   ALT 10 01/12/2022   AST 22 01/12/2022   ALKPHOS 85 01/12/2022   BILITOT 0.5 01/12/2022   Lab Results  Component Value Date   HGBA1C 7.4 (H) 01/12/2022   HGBA1C 6.2 (H) 03/23/2018   HGBA1C 5.9 (A) 01/06/2018   HGBA1C 7.1 (H) 09/05/2017   HGBA1C 6.7 (H) 10/27/2015   Lab Results  Component Value Date   INSULIN 13.5 01/12/2022   INSULIN 8.0 03/23/2018   INSULIN 10.7 09/05/2017   Lab Results  Component Value Date   TSH 4.520 (H) 01/12/2022   Lab Results  Component Value Date   CHOL 198 01/12/2022   HDL 59 01/12/2022   LDLCALC 123 (H) 01/12/2022   TRIG 86 01/12/2022   CHOLHDL 3.4 01/12/2022   Lab Results  Component Value Date   WBC 4.4 01/12/2022   HGB 13.9 01/12/2022   HCT 42.2 01/12/2022   MCV 94 01/12/2022   PLT 241 01/12/2022  No results found for: "IRON", "TIBC", "FERRITIN"  Attestation Statements:   Reviewed by clinician on day of visit: allergies, medications, problem list, medical history, surgical history, family history, social history, and previous encounter notes.   Wilhemena Durie, am acting as transcriptionist for Loyal Gambler, DO.  I have reviewed the above documentation for accuracy and completeness, and I agree with the above. - ***

## 2022-01-25 ENCOUNTER — Telehealth: Payer: Self-pay | Admitting: Internal Medicine

## 2022-01-25 ENCOUNTER — Encounter: Payer: Self-pay | Admitting: Internal Medicine

## 2022-01-25 ENCOUNTER — Ambulatory Visit (AMBULATORY_SURGERY_CENTER): Payer: Medicare Other | Admitting: *Deleted

## 2022-01-25 VITALS — Ht 61.0 in | Wt 263.0 lb

## 2022-01-25 DIAGNOSIS — Z86010 Personal history of colonic polyps: Secondary | ICD-10-CM

## 2022-01-25 MED ORDER — NA SULFATE-K SULFATE-MG SULF 17.5-3.13-1.6 GM/177ML PO SOLN: 1.0000 | Freq: Once | ORAL | 0 refills | Status: AC

## 2022-01-25 NOTE — Telephone Encounter (Signed)
Patient called states she is having sore throat and would like to do her Pre-visit over the phone. Appointment is at 11 am on 9/18. Thank you

## 2022-01-25 NOTE — Progress Notes (Signed)
No egg or soy allergy known to patient  No issues known to pt with past sedation with any surgeries or procedures Patient denies ever being told they had issues or difficulty with intubation  No FH of Malignant Hyperthermia Pt is not on diet pills Pt is not on home 02  Pt is not on blood thinners  Pt denies issues with constipation  No A fib or A flutter Have any cardiac testing pending--NO Pt instructed to use [email or web address] or GoodRx for a price reduction on prep   

## 2022-01-26 ENCOUNTER — Ambulatory Visit (INDEPENDENT_AMBULATORY_CARE_PROVIDER_SITE_OTHER): Payer: Medicare Other | Admitting: Family Medicine

## 2022-01-26 ENCOUNTER — Encounter (INDEPENDENT_AMBULATORY_CARE_PROVIDER_SITE_OTHER): Payer: Self-pay | Admitting: Family Medicine

## 2022-01-26 VITALS — BP 134/83 | HR 72 | Temp 98.2°F | Ht 62.0 in | Wt 264.0 lb

## 2022-01-26 DIAGNOSIS — E119 Type 2 diabetes mellitus without complications: Secondary | ICD-10-CM | POA: Diagnosis not present

## 2022-01-26 DIAGNOSIS — Z9189 Other specified personal risk factors, not elsewhere classified: Secondary | ICD-10-CM

## 2022-01-26 DIAGNOSIS — K582 Mixed irritable bowel syndrome: Secondary | ICD-10-CM

## 2022-01-26 DIAGNOSIS — Z7985 Long-term (current) use of injectable non-insulin antidiabetic drugs: Secondary | ICD-10-CM

## 2022-01-26 DIAGNOSIS — E669 Obesity, unspecified: Secondary | ICD-10-CM | POA: Diagnosis not present

## 2022-01-26 DIAGNOSIS — K2100 Gastro-esophageal reflux disease with esophagitis, without bleeding: Secondary | ICD-10-CM | POA: Diagnosis not present

## 2022-01-26 DIAGNOSIS — Z6841 Body Mass Index (BMI) 40.0 and over, adult: Secondary | ICD-10-CM

## 2022-01-26 MED ORDER — INSULIN PEN NEEDLE 31G X 5 MM MISC: 0 refills | Status: AC

## 2022-01-26 MED ORDER — FAMOTIDINE 40 MG PO TABS: 40.0000 mg | ORAL_TABLET | Freq: Two times a day (BID) | ORAL | 0 refills | Status: AC

## 2022-01-26 MED ORDER — VICTOZA 18 MG/3ML ~~LOC~~ SOPN: 0.6000 mg | PEN_INJECTOR | Freq: Every day | SUBCUTANEOUS | 0 refills | Status: AC

## 2022-01-27 ENCOUNTER — Encounter (INDEPENDENT_AMBULATORY_CARE_PROVIDER_SITE_OTHER): Payer: Self-pay

## 2022-01-28 NOTE — Progress Notes (Signed)
Chief Complaint:   OBESITY Jessica Good is here to discuss her progress with her obesity treatment plan along with follow-up of her obesity related diagnoses. Jessica Good is on the Category 1 Plan and states she is following her eating plan approximately 60% of the time. Jessica Good states she is not exercising.   Today's visit was #: 2 Starting weight: 267 lbs Starting date: 09/005/2023 Today's weight: 264 lbs Today's date: 01/26/2022 Total lbs lost to date: 3 lbs Total lbs lost since last in-office visit: 3 lbs  Interim History: Has not been eating everything on meal plan.  Waking up with dyspepsia which causes morning nausea.  Having toast with peanut butter around 9 AM, coke at 1 PM.  Then is starving by dinner.  She is having lean meat, veggies and crackers at 10 PM.    Subjective:   1. Type 2 diabetes mellitus without complication, without long-term current use of insulin (Jessica Good) Discussed labs with patient today. A1c 7.4, took metformin in the past.  Continues to consume excess sugar.  2. Irritable bowel syndrome with both constipation and diarrhea Has both diarrhea and constipation.  Has upcoming visit with GI.   3. Gastroesophageal reflux disease with esophagitis, unspecified whether hemorrhage Has morning nausea likely from GERD at night.  Reports previous side effects on PPI's.  Has a visit with GI scheduled.   Assessment/Plan:   1. Type 2 diabetes mellitus without complication, without long-term current use of insulin (Jessica Good) Patient denies a personal or family history of pancreatitis, medullary thyroid carcinoma or multiple endocrine neoplasia type II. Recommend reviewing pen training video online.  Start - liraglutide (VICTOZA) 18 MG/3ML SOPN; Inject 0.6 mg into the skin daily.  Dispense: 9 mL; Refill: 0  Start - Insulin Pen Needle 31G X 5 MM MISC; Use once daily as directed  Dispense: 30 each; Refill: 0  2. Irritable bowel syndrome with both constipation and diarrhea Discussed  dietary options to work around and GI issues.  Reducing soda intake, small meals, etc.   3. Gastroesophageal reflux disease with esophagitis, unspecified whether hemorrhage Begin - famotidine (PEPCID) 40 MG tablet; Take 1 tablet (40 mg total) by mouth 2 (two) times daily.  Dispense: 60 tablet; Refill: 0  4. Obesity,current BMI 48.3 1) No meal skipping 2) Okay to change to thin sliced [employer] bread with peanut butter for breakfast and use for meal plan, lunch.   3) Discontinue soda 4) 100 calorie after dinner snack okay.   Jessica Good is currently in the action stage of change. As such, her goal is to continue with weight loss efforts. She has agreed to the Category 1 Plan.   Exercise goals: All adults should avoid inactivity. Some physical activity is better than none, and adults who participate in any amount of physical activity gain some health benefits.  Behavioral modification strategies: increasing lean protein intake, increasing vegetables, increasing water intake, increasing high fiber foods, no skipping meals, keeping healthy foods in the home, ways to avoid night time snacking, better snacking choices, emotional eating strategies, planning for success, and decreasing junk food.  Jessica Good has agreed to follow-up with our clinic in 2 weeks. She was informed of the importance of frequent follow-up visits to maximize her success with intensive lifestyle modifications for her multiple health conditions.   Objective:   Blood pressure 134/83, pulse 72, temperature 98.2 F (36.8 C), height '5\' 2"'$  (1.575 m), weight 264 lb (119.7 kg), SpO2 92 %. Body mass index is 48.29 kg/m.  General: Cooperative, alert, well developed, in no acute distress. HEENT: Conjunctivae and lids unremarkable. Cardiovascular: Regular rhythm.  Lungs: Normal work of breathing. Neurologic: No focal deficits.   Lab Results  Component Value Date   CREATININE 0.89 01/12/2022   BUN 11 01/12/2022   NA 140 01/12/2022    K 4.0 01/12/2022   CL 99 01/12/2022   CO2 25 01/12/2022   Lab Results  Component Value Date   ALT 10 01/12/2022   AST 22 01/12/2022   ALKPHOS 85 01/12/2022   BILITOT 0.5 01/12/2022   Lab Results  Component Value Date   HGBA1C 7.4 (H) 01/12/2022   HGBA1C 6.2 (H) 03/23/2018   HGBA1C 5.9 (A) 01/06/2018   HGBA1C 7.1 (H) 09/05/2017   HGBA1C 6.7 (H) 10/27/2015   Lab Results  Component Value Date   INSULIN 13.5 01/12/2022   INSULIN 8.0 03/23/2018   INSULIN 10.7 09/05/2017   Lab Results  Component Value Date   TSH 4.520 (H) 01/12/2022   Lab Results  Component Value Date   CHOL 198 01/12/2022   HDL 59 01/12/2022   LDLCALC 123 (H) 01/12/2022   TRIG 86 01/12/2022   CHOLHDL 3.4 01/12/2022   Lab Results  Component Value Date   VD25OH 58.5 01/12/2022   VD25OH 46.6 03/23/2018   VD25OH 55.1 09/05/2017   Lab Results  Component Value Date   WBC 4.4 01/12/2022   HGB 13.9 01/12/2022   HCT 42.2 01/12/2022   MCV 94 01/12/2022   PLT 241 01/12/2022   No results found for: "IRON", "TIBC", "FERRITIN"  Attestation Statements:   Reviewed by clinician on day of visit: allergies, medications, problem list, medical history, surgical history, family history, social history, and previous encounter notes.  I, Davy Pique, am acting as Location manager for Loyal Gambler, DO.  I have reviewed the above documentation for accuracy and completeness, and I agree with the above. Dell Ponto, DO

## 2022-02-02 DIAGNOSIS — M47816 Spondylosis without myelopathy or radiculopathy, lumbar region: Secondary | ICD-10-CM | POA: Diagnosis not present

## 2022-02-02 DIAGNOSIS — M810 Age-related osteoporosis without current pathological fracture: Secondary | ICD-10-CM | POA: Diagnosis not present

## 2022-02-03 ENCOUNTER — Encounter (HOSPITAL_COMMUNITY): Payer: Self-pay | Admitting: Internal Medicine

## 2022-02-04 ENCOUNTER — Telehealth: Payer: Self-pay | Admitting: Internal Medicine

## 2022-02-04 NOTE — Telephone Encounter (Signed)
All questions answered

## 2022-02-04 NOTE — Telephone Encounter (Signed)
Inbound call from patient stating that she has a question regarding the prep medication. Patient is scheduled to have procedure on 10/5 at the hospital. Patient is requesting a call back to discuss. Please advise.

## 2022-02-10 ENCOUNTER — Ambulatory Visit (INDEPENDENT_AMBULATORY_CARE_PROVIDER_SITE_OTHER): Payer: Medicare Other | Admitting: Internal Medicine

## 2022-02-11 ENCOUNTER — Ambulatory Visit (HOSPITAL_COMMUNITY): Payer: Medicare Other | Admitting: Anesthesiology

## 2022-02-11 ENCOUNTER — Ambulatory Visit (HOSPITAL_BASED_OUTPATIENT_CLINIC_OR_DEPARTMENT_OTHER): Payer: Medicare Other | Admitting: Anesthesiology

## 2022-02-11 ENCOUNTER — Ambulatory Visit (HOSPITAL_COMMUNITY)
Admission: RE | Admit: 2022-02-11 | Discharge: 2022-02-11 | Disposition: A | Discharge status: Home / Self Care | Payer: Medicare Other | Attending: Internal Medicine | Admitting: Internal Medicine

## 2022-02-11 ENCOUNTER — Encounter (HOSPITAL_COMMUNITY): Payer: Self-pay | Admitting: Internal Medicine

## 2022-02-11 ENCOUNTER — Encounter (HOSPITAL_COMMUNITY)
Admission: RE | Disposition: A | Discharge status: Home / Self Care | Payer: Self-pay | Source: Home / Self Care | Attending: Internal Medicine

## 2022-02-11 ENCOUNTER — Other Ambulatory Visit: Payer: Self-pay

## 2022-02-11 DIAGNOSIS — Z09 Encounter for follow-up examination after completed treatment for conditions other than malignant neoplasm: Secondary | ICD-10-CM

## 2022-02-11 DIAGNOSIS — K635 Polyp of colon: Secondary | ICD-10-CM

## 2022-02-11 DIAGNOSIS — E119 Type 2 diabetes mellitus without complications: Secondary | ICD-10-CM

## 2022-02-11 DIAGNOSIS — K449 Diaphragmatic hernia without obstruction or gangrene: Secondary | ICD-10-CM | POA: Diagnosis not present

## 2022-02-11 DIAGNOSIS — Z860100 Personal history of colon polyps, unspecified: Secondary | ICD-10-CM

## 2022-02-11 DIAGNOSIS — I10 Essential (primary) hypertension: Secondary | ICD-10-CM | POA: Diagnosis not present

## 2022-02-11 DIAGNOSIS — K5730 Diverticulosis of large intestine without perforation or abscess without bleeding: Secondary | ICD-10-CM | POA: Diagnosis not present

## 2022-02-11 DIAGNOSIS — Z1211 Encounter for screening for malignant neoplasm of colon: Secondary | ICD-10-CM | POA: Diagnosis not present

## 2022-02-11 DIAGNOSIS — D120 Benign neoplasm of cecum: Secondary | ICD-10-CM

## 2022-02-11 HISTORY — PX: COLONOSCOPY WITH PROPOFOL: SHX5780

## 2022-02-11 HISTORY — PX: POLYPECTOMY: SHX5525

## 2022-02-11 SURGERY — COLONOSCOPY WITH PROPOFOL
Anesthesia: Monitor Anesthesia Care

## 2022-02-11 MED ORDER — PROPOFOL 500 MG/50ML IV EMUL
INTRAVENOUS | Status: DC | PRN
  Administered 2022-02-11: 130 ug/kg/min via INTRAVENOUS

## 2022-02-11 MED ORDER — PROPOFOL 1000 MG/100ML IV EMUL
INTRAVENOUS | Status: AC
  Filled 2022-02-11: qty 100

## 2022-02-11 MED ORDER — LACTATED RINGERS IV SOLN: INTRAVENOUS | Status: DC

## 2022-02-11 MED ORDER — SODIUM CHLORIDE 0.9 % IV SOLN: INTRAVENOUS | Status: DC

## 2022-02-11 MED ORDER — PROPOFOL 10 MG/ML IV BOLUS
INTRAVENOUS | Status: DC | PRN
  Administered 2022-02-11 (×3): 20 mg via INTRAVENOUS

## 2022-02-11 SURGICAL SUPPLY — 22 items

## 2022-02-11 NOTE — Transfer of Care (Signed)
Immediate Anesthesia Transfer of Care Note  Patient: Jessica Good  Procedure(s) Performed: COLONOSCOPY WITH PROPOFOL POLYPECTOMY  Patient Location: PACU  Anesthesia Type:MAC  Level of Consciousness: sedated  Airway & Oxygen Therapy: Patient Spontanous Breathing and Patient connected to face mask oxygen  Post-op Assessment: Report given to RN and Post -op Vital signs reviewed and stable  Post vital signs: Reviewed and stable  Last Vitals:  Vitals Value Taken Time  BP    Temp    Pulse    Resp 17 02/11/22 0923  SpO2    Vitals shown include unvalidated device data.  Last Pain:  Vitals:   02/11/22 0804  TempSrc: Temporal         Complications: No notable events documented.

## 2022-02-11 NOTE — Anesthesia Preprocedure Evaluation (Addendum)
Anesthesia Evaluation  Patient identified by MRN, date of birth, ID band Patient awake    Reviewed: Allergy & Precautions, NPO status , Patient's Chart, lab work & pertinent test results  Airway Mallampati: II       Dental   Pulmonary neg pulmonary ROS,    breath sounds clear to auscultation       Cardiovascular hypertension,  Rhythm:Regular Rate:Normal     Neuro/Psych PSYCHIATRIC DISORDERS  Neuromuscular disease    GI/Hepatic Neg liver ROS, hiatal hernia, GERD  ,  Endo/Other  diabetesHypothyroidism Hyperthyroidism   Renal/GU      Musculoskeletal   Abdominal   Peds  Hematology   Anesthesia Other Findings   Reproductive/Obstetrics                             Anesthesia Physical Anesthesia Plan  ASA: 3  Anesthesia Plan: MAC   Post-op Pain Management:    Induction: Intravenous  PONV Risk Score and Plan: Treatment may vary due to age or medical condition  Airway Management Planned: Nasal Cannula and Simple Face Mask  Additional Equipment:   Intra-op Plan:   Post-operative Plan:   Informed Consent: I have reviewed the patients History and Physical, chart, labs and discussed the procedure including the risks, benefits and alternatives for the proposed anesthesia with the patient or authorized representative who has indicated his/her understanding and acceptance.     Dental advisory given  Plan Discussed with: Anesthesiologist and CRNA  Anesthesia Plan Comments:        Anesthesia Quick Evaluation

## 2022-02-11 NOTE — Anesthesia Postprocedure Evaluation (Signed)
Anesthesia Post Note  Patient: Jessica Good  Procedure(s) Performed: COLONOSCOPY WITH PROPOFOL POLYPECTOMY     Patient location during evaluation: Endoscopy Anesthesia Type: MAC Level of consciousness: awake Pain management: pain level controlled Vital Signs Assessment: post-procedure vital signs reviewed and stable Respiratory status: spontaneous breathing Cardiovascular status: stable Postop Assessment: no apparent nausea or vomiting Anesthetic complications: no   No notable events documented.  Last Vitals:  Vitals:   02/11/22 0930 02/11/22 0940  BP: 107/83 (!) 154/91  Pulse: 68 (!) 57  Resp: 13 15  Temp:    SpO2: 98% 100%    Last Pain:  Vitals:   02/11/22 0922  TempSrc: Temporal  PainSc: 0-No pain                 Erron Wengert

## 2022-02-11 NOTE — Op Note (Addendum)
[facility] Patient Name: Jessica Good Procedure Date: 02/11/2022 MRN: [id number] Attending MD: Jerene Bears , MD Date of Birth: [birth year] CSN: [id number] Age: 71 Admit Type: Outpatient Procedure:                Colonoscopy Indications:              High risk colon cancer surveillance: Personal                            history of non-advanced adenoma, Last colonoscopy:                            April 2018 Providers:                Lajuan Lines. Hilarie Fredrickson, MD, Benay Pillow, RN, Darliss Cheney,                            Technician Referring MD:             Janalyn Harder Jerelene Redden, NP Medicines:                Monitored Anesthesia Care Complications:            No immediate complications. Estimated Blood Loss:     Estimated blood loss was minimal. Procedure:                Pre-Anesthesia Assessment:                           - Prior to the procedure, a History and Physical                            was performed, and patient medications and                            allergies were reviewed. The patient's tolerance of                            previous anesthesia was also reviewed. The risks                            and benefits of the procedure and the sedation                            options and risks were discussed with the patient.                            All questions were answered, and informed consent                            was obtained. Prior Anticoagulants: The patient has                            taken no previous anticoagulant or antiplatelet                            agents.  ASA Grade Assessment: III - A patient with                            severe systemic disease. After reviewing the risks                            and benefits, the patient was deemed in                            satisfactory condition to undergo the procedure.                           After obtaining informed consent, the colonoscope                            was passed under  direct vision. Throughout the                            procedure, the patient's blood pressure, pulse, and                            oxygen saturations were monitored continuously. The                            CF-HQ190L ([id number]) Olympus colonoscope was                            introduced through the anus and advanced to the                            cecum, identified by appendiceal orifice and                            ileocecal valve. The colonoscopy was performed                            without difficulty. The patient tolerated the                            procedure well. The quality of the bowel                            preparation was good. Scope In: 9:04:48 AM Scope Out: 9:17:57 AM Scope Withdrawal Time: 0 hours 10 minutes 39 seconds  Total Procedure Duration: 0 hours 13 minutes 9 seconds  Findings:      The digital rectal exam was normal.      A 6 mm polyp was found in the cecum. The polyp was sessile and had       typical appearance of non-advanced adenoma. The polyp was removed with a       cold snare. Resection and retrieval were felt complete.      Multiple small-mouthed diverticula were found in the sigmoid colon.      The retroflexed view of the distal rectum and anal verge was normal and  showed no anal or rectal abnormalities. Impression:               - One 6 mm polyp in the cecum, removed with a cold                            snare. Resected and retrieved.                           - Mild diverticulosis in the sigmoid colon.                           - The distal rectum and anal verge are normal on                            retroflexion view. Moderate Sedation:      N/A Recommendation:           - Patient has a contact number available for                            emergencies. The signs and symptoms of potential                            delayed complications were discussed with the                            patient. Return to normal  activities tomorrow.                            Written discharge instructions were provided to the                            patient.                           - Resume previous diet.                           - Continue present medications.                           - Await pathology results.                           - Separate issue mentioned by patient, chronic                            cough is better but not resolved. Pantoprazole                            helpful but developed itchy rash from elbows to                            wrists which resolved. On famotidine 40 mg BID now.  Try OTC lansoprazole 15 mg daily. If tolerated and                            symptoms improve but not resolve could change to RX                            strength (30 mg daily).                           - Repeat colonoscopy may be recommended for                            surveillance. The colonoscopy date will be                            determined after pathology results from today's                            exam become available for review. Procedure Code(s):        --- Professional ---                           [phone], Colonoscopy, flexible; with removal of                            tumor(s), polyp(s), or other lesion(s) by snare                            technique Diagnosis Code(s):        --- Professional ---                           Z86.010, Personal history of colonic polyps                           K63.5, Polyp of colon                           K57.30, Diverticulosis of large intestine without                            perforation or abscess without bleeding CPT copyright 2019 [employer]. All rights reserved. The codes documented in this report are preliminary and upon coder review may  be revised to meet current compliance requirements. Jerene Bears, MD 02/11/2022 9:29:50 AM This report has been signed electronically. Number of  Addenda: 0

## 2022-02-11 NOTE — H&P (Signed)
GASTROENTEROLOGY PROCEDURE H&P NOTE   Primary Care Physician: Everardo Beals, NP    Reason for Procedure:  History of colon polyps  Plan:    colonoscopy  Patient is appropriate for endoscopic procedure in the outpatient hospital setting  The nature of the procedure, as well as the risks, benefits, and alternatives were carefully and thoroughly reviewed with the patient. Ample time for discussion and questions allowed. The patient understood, was satisfied, and agreed to proceed.     HPI: Jessica Good is a 71 y.o. female who presents for colonoscopy.  Medical history as below.  Tolerated the prep.  No recent chest pain or shortness of breath.  No abdominal pain today.  Past Medical History:  Diagnosis Date   Adenomatous colon polyp    Allergy    SNEEZING   Anemia yrs ago   PMH   Anxiety    Back pain    Cataract    Constipation due to pain medication    Diabetes mellitus without complication ([place])    NO MEDS AS OF 01/25/22   Edema of both lower extremities    Edema of lower extremity    Takes lasix PRN   Gallstones    Gastritis    GERD (gastroesophageal reflux disease)    with meds   Hiatal hernia    History of recurrent UTIs    Hyperlipidemia    Hypertension    Hypothyroidism    IBS (irritable bowel syndrome)    Interstitial cystitis    Joint pain    Loose, teeth    in the front of mouth   Osteoarthritis    Osteoporosis    osteoarthritis   Pre-diabetes    Pseudogout    SOBOE (shortness of breath on exertion)    Urgency of urination     Past Surgical History:  Procedure Laterality Date   ANKLE SURGERY Right    BREAST LUMPECTOMY Left 1996   fibroid   cataracts Bilateral    lens implanted   COLONOSCOPY W/ POLYPECTOMY     COLONOSCOPY WITH PROPOFOL N/A 09/02/2016   Procedure: COLONOSCOPY WITH PROPOFOL;  Surgeon: Milus Banister, MD;  Location: [facility];  Service: Endoscopy;  Laterality: N/A;   EUS N/A 02/06/2015   Procedure: UPPER  ENDOSCOPIC ULTRASOUND (EUS) LINEAR;  Surgeon: Milus Banister, MD;  Location: [facility];  Service: Endoscopy;  Laterality: N/A;   EUS N/A 09/02/2016   Procedure: UPPER ENDOSCOPIC ULTRASOUND (EUS) LINEAR;  Surgeon: Milus Banister, MD;  Location: [facility];  Service: Endoscopy;  Laterality: N/A;   EYE SURGERY Bilateral    lasix 14 yrs ago   JOINT REPLACEMENT     PARTIAL KNEE ARTHROPLASTY Left 11/26/2014   Procedure: LEFT UNICOMPARTMENTAL KNEE;  Surgeon: Renette Butters, MD;  Location: [facility];  Service: Orthopedics;  Laterality: Left;   TOTAL KNEE ARTHROPLASTY Right 04/09/2014   DR MURPHY   TOTAL KNEE ARTHROPLASTY Right 04/09/2014   Procedure: TOTAL KNEE ARTHROPLASTY;  Surgeon: Renette Butters, MD;  Location: [place];  Service: Orthopedics;  Laterality: Right;   TOTAL KNEE REVISION Left 11/03/2015   Procedure: TOTAL KNEE REVISION;  Surgeon: Renette Butters, MD;  Location: [facility];  Service: Orthopedics;  Laterality: Left;    Prior to Admission medications   Medication Sig Start Date End Date Taking? Authorizing Provider  acetaminophen (TYLENOL) 500 MG tablet Take 1,000 mg by mouth every 8 (eight) hours as needed for moderate pain.   Yes [provider]  amoxicillin (AMOXIL) 500 MG capsule Take 1 capsule by mouth daily. To prevent UTI's 01/23/18  Yes [provider]  aspirin 81 MG tablet Take 81 mg by mouth daily.   Yes [provider]  Calcium Carb-Cholecalciferol 600-10 MG-MCG TABS Take 1 tablet by mouth daily. 12/08/15  Yes [provider]  cholecalciferol (VITAMIN D) 1000 UNITS tablet Take 1,000 Units by mouth daily.   Yes [provider]  citalopram (CELEXA) 40 MG tablet Take 40 mg by mouth at bedtime.   Yes [provider]  cyclobenzaprine (FLEXERIL) 5 MG tablet Take 5 mg by mouth 3 (three) times daily as needed for muscle spasms.   Yes [provider]  diclofenac Sodium (VOLTAREN) 1 % GEL Apply 2 g topically 3 (three) times  daily as needed (pain). 02/04/22  Yes [provider]  losartan-hydrochlorothiazide (HYZAAR) 50-12.5 MG tablet Take 1 tablet by mouth daily. 11/27/21  Yes [provider]  Multiple Vitamin (MULTIVITAMIN PO) Take 1 tablet by mouth daily.   Yes [provider]  naproxen (NAPROSYN) 500 MG tablet Take 500 mg by mouth 2 (two) times daily as needed for moderate pain. 11/27/21  Yes [provider]  OVER THE COUNTER MEDICATION Take 1 tablet by mouth at bedtime as needed (sleep). Midnight Sleep Supplement   Yes [provider]  PROLIA 60 MG/ML SOSY injection Inject into the skin. Injection done every 6 months 07/02/20  Yes [provider]  thiamine (VITAMIN B-1) 100 MG tablet Take 100 mg by mouth daily.   Yes [provider]  vitamin C (ASCORBIC ACID) 500 MG tablet Take 500 mg by mouth daily.   Yes [provider]  famotidine (PEPCID) 40 MG tablet Take 1 tablet (40 mg total) by mouth 2 (two) times daily. Patient not taking: Reported on 02/04/2022 01/26/22   Bowen, Collene Leyden, DO  Insulin Pen Needle 31G X 5 MM MISC Use once daily as directed 01/26/22   Bowen, Collene Leyden, DO  liraglutide (VICTOZA) 18 MG/3ML SOPN Inject 0.6 mg into the skin daily. Patient not taking: Reported on 02/04/2022 01/26/22   Bowen, Collene Leyden, DO    Current Facility-Administered Medications  Medication Dose Route Frequency Provider Last Rate Last Admin   0.9 %  sodium chloride infusion   Intravenous Continuous Qadir Folks, Lajuan Lines, MD       lactated ringers infusion   Intravenous Continuous Jen Benedict, Lajuan Lines, MD 10 mL/hr at 02/11/22 0809 New Bag at 02/11/22 0809    Allergies as of 12/08/2021 - Review Complete 07/20/2021  Allergen Reaction Noted   Pantoprazole Rash 12/15/2020   Demerol [meperidine] Other (See Comments) 10/24/2015   Latex Rash 11/03/2015   Sulfa antibiotics Other (See Comments) 10/12/2012   Adhesive [tape] Rash 10/24/2015    Family History  Problem Relation Age of  Onset   Cancer Mother    Pancreatic cancer Mother        head of pancreas removed    Diabetes Mother    Hypertension Mother    Obesity Mother    Obesity Father    Diabetes Father    Heart disease Father    Hypertension Father    Colon polyps Father    Sudden death Father    Diabetes Maternal Grandmother    Breast cancer Maternal Aunt 68   Celiac disease Neg Hx    Cirrhosis Neg Hx    Clotting disorder Neg Hx    Colitis Neg Hx    Colon cancer Neg Hx  Crohn's disease Neg Hx    Cystic fibrosis Neg Hx    Esophageal cancer Neg Hx    Hemochromatosis Neg Hx    Inflammatory bowel disease Neg Hx    Irritable bowel syndrome Neg Hx    Kidney disease Neg Hx    Liver cancer Neg Hx    Liver disease Neg Hx    Ovarian cancer Neg Hx    Prostate cancer Neg Hx    Rectal cancer Neg Hx    Stomach cancer Neg Hx    Ulcerative colitis Neg Hx    Uterine cancer Neg Hx    Wilson's disease Neg Hx    Goiter Neg Hx     Social History   Socioeconomic History   Marital status: Married    Spouse name: Ovid Curd   Number of children: 2   Years of education: Not on file   Highest education level: Not on file  Occupational History   Occupation: stay at home spouse  Tobacco Use   Smoking status: Never    Passive exposure: Past   Smokeless tobacco: Never  Vaping Use   Vaping Use: Never used  Substance and Sexual Activity   Alcohol use: No    Alcohol/week: 0.0 standard drinks of alcohol   Drug use: No   Sexual activity: Not on file  Other Topics Concern   Not on file  Social History Narrative   Not on file   Social Determinants of Health   Financial Resource Strain: Not on file  Food Insecurity: Not on file  Transportation Needs: Not on file  Physical Activity: Not on file  Stress: Not on file  Social Connections: Not on file  Intimate Partner Violence: Not on file    Physical Exam: Vital signs in last 24 hours: '@BP'$  (!) 147/69   Pulse 70   Temp (!) 97 F (36.1 C) (Temporal)    Resp 17   Ht '5\' 1"'$  (1.549 m)   Wt 121.1 kg   SpO2 99%   BMI 50.45 kg/m  GEN: NAD EYE: Sclerae anicteric ENT: MMM CV: Non-tachycardic Pulm: CTA b/l GI: Soft, NT/ND NEURO:  Alert & Oriented x 3   Zenovia Jarred, MD [facility]  02/11/2022 8:44 AM

## 2022-02-11 NOTE — Discharge Instructions (Signed)
YOU HAD AN ENDOSCOPIC PROCEDURE TODAY: Refer to the procedure report and other information in the discharge instructions given to you for any specific questions about what was found during the examination. If this information does not answer your questions, please call [facility] office at [phone] to clarify.  ° °YOU SHOULD EXPECT: Some feelings of bloating in the abdomen. Passage of more gas than usual. Walking can help get rid of the air that was put into your GI tract during the procedure and reduce the bloating. If you had a lower endoscopy (such as a colonoscopy or flexible sigmoidoscopy) you may notice spotting of blood in your stool or on the toilet paper. Some abdominal soreness may be present for a day or two, also. ° °DIET: Your first meal following the procedure should be a light meal and then it is ok to progress to your normal diet. A half-sandwich or bowl of soup is an example of a good first meal. Heavy or fried foods are harder to digest and may make you feel nauseous or bloated. Drink plenty of fluids but you should avoid alcoholic beverages for 24 hours. If you had a esophageal dilation, please see attached instructions for diet.   ° °ACTIVITY: Your care partner should take you home directly after the procedure. You should plan to take it easy, moving slowly for the rest of the day. You can resume normal activity the day after the procedure however YOU SHOULD NOT DRIVE, use power tools, machinery or perform tasks that involve climbing or major physical exertion for 24 hours (because of the sedation medicines used during the test).  ° °SYMPTOMS TO REPORT IMMEDIATELY: °A gastroenterologist can be reached at any hour. Please call [phone]  for any of the following symptoms:  °Following lower endoscopy (colonoscopy, flexible sigmoidoscopy) °Excessive amounts of blood in the stool  °Significant tenderness, worsening of abdominal pains  °Swelling of the abdomen that is new, acute  °Fever of 100° or  higher  °Following upper endoscopy (EGD, EUS, ERCP, esophageal dilation) °Vomiting of blood or coffee ground material  °New, significant abdominal pain  °New, significant chest pain or pain under the shoulder blades  °Painful or persistently difficult swallowing  °New shortness of breath  °Black, tarry-looking or red, bloody stools ° °FOLLOW UP:  °If any biopsies were taken you will be contacted by phone or by letter within the next 1-3 weeks. Call [phone]  if you have not heard about the biopsies in 3 weeks.  °Please also call with any specific questions about appointments or follow up tests. ° °

## 2022-02-12 ENCOUNTER — Encounter: Payer: Self-pay | Admitting: Internal Medicine

## 2022-02-12 LAB — SURGICAL PATHOLOGY

## 2022-02-15 ENCOUNTER — Encounter (HOSPITAL_COMMUNITY): Payer: Self-pay | Admitting: Internal Medicine

## 2022-04-05 DIAGNOSIS — F32A Depression, unspecified: Secondary | ICD-10-CM | POA: Diagnosis not present

## 2022-04-05 DIAGNOSIS — E1165 Type 2 diabetes mellitus with hyperglycemia: Secondary | ICD-10-CM | POA: Diagnosis not present

## 2022-05-25 DIAGNOSIS — H35342 Macular cyst, hole, or pseudohole, left eye: Secondary | ICD-10-CM | POA: Diagnosis not present

## 2022-05-25 DIAGNOSIS — H524 Presbyopia: Secondary | ICD-10-CM | POA: Diagnosis not present

## 2022-05-25 DIAGNOSIS — E119 Type 2 diabetes mellitus without complications: Secondary | ICD-10-CM | POA: Diagnosis not present

## 2022-05-25 DIAGNOSIS — Z961 Presence of intraocular lens: Secondary | ICD-10-CM | POA: Diagnosis not present

## 2022-06-07 DIAGNOSIS — H35033 Hypertensive retinopathy, bilateral: Secondary | ICD-10-CM | POA: Diagnosis not present

## 2022-06-07 DIAGNOSIS — H35372 Puckering of macula, left eye: Secondary | ICD-10-CM | POA: Diagnosis not present

## 2022-06-07 DIAGNOSIS — H43813 Vitreous degeneration, bilateral: Secondary | ICD-10-CM | POA: Diagnosis not present

## 2022-07-07 DIAGNOSIS — H35372 Puckering of macula, left eye: Secondary | ICD-10-CM | POA: Diagnosis not present

## 2022-07-21 DIAGNOSIS — H35372 Puckering of macula, left eye: Secondary | ICD-10-CM | POA: Diagnosis not present

## 2022-08-05 DIAGNOSIS — R5383 Other fatigue: Secondary | ICD-10-CM | POA: Diagnosis not present

## 2022-08-05 DIAGNOSIS — M810 Age-related osteoporosis without current pathological fracture: Secondary | ICD-10-CM | POA: Diagnosis not present

## 2022-08-05 DIAGNOSIS — E559 Vitamin D deficiency, unspecified: Secondary | ICD-10-CM | POA: Diagnosis not present

## 2022-08-06 LAB — LAB REPORT - SCANNED: EGFR: 74

## 2022-08-18 DIAGNOSIS — E559 Vitamin D deficiency, unspecified: Secondary | ICD-10-CM | POA: Diagnosis not present

## 2022-08-18 DIAGNOSIS — M810 Age-related osteoporosis without current pathological fracture: Secondary | ICD-10-CM | POA: Diagnosis not present

## 2022-08-23 DIAGNOSIS — J209 Acute bronchitis, unspecified: Secondary | ICD-10-CM | POA: Diagnosis not present

## 2022-08-23 DIAGNOSIS — B372 Candidiasis of skin and nail: Secondary | ICD-10-CM | POA: Diagnosis not present

## 2022-11-04 DIAGNOSIS — E782 Mixed hyperlipidemia: Secondary | ICD-10-CM | POA: Diagnosis not present

## 2022-11-04 DIAGNOSIS — G8929 Other chronic pain: Secondary | ICD-10-CM | POA: Diagnosis not present

## 2022-11-04 DIAGNOSIS — E1140 Type 2 diabetes mellitus with diabetic neuropathy, unspecified: Secondary | ICD-10-CM | POA: Diagnosis not present

## 2022-11-04 DIAGNOSIS — I10 Essential (primary) hypertension: Secondary | ICD-10-CM | POA: Diagnosis not present

## 2022-11-04 DIAGNOSIS — Z79899 Other long term (current) drug therapy: Secondary | ICD-10-CM | POA: Diagnosis not present

## 2022-11-04 DIAGNOSIS — E049 Nontoxic goiter, unspecified: Secondary | ICD-10-CM | POA: Diagnosis not present

## 2022-11-08 DIAGNOSIS — H35032 Hypertensive retinopathy, left eye: Secondary | ICD-10-CM | POA: Diagnosis not present

## 2022-11-08 DIAGNOSIS — H43811 Vitreous degeneration, right eye: Secondary | ICD-10-CM | POA: Diagnosis not present

## 2022-12-13 ENCOUNTER — Ambulatory Visit: Payer: Medicare Other | Admitting: Family Medicine

## 2023-01-28 ENCOUNTER — Emergency Department: Payer: Medicare Other

## 2023-01-28 ENCOUNTER — Other Ambulatory Visit: Payer: Self-pay

## 2023-01-28 ENCOUNTER — Emergency Department
Admission: EM | Admit: 2023-01-28 | Discharge: 2023-01-28 | Disposition: A | Discharge status: Home / Self Care | Payer: Medicare Other | Attending: Emergency Medicine | Admitting: Emergency Medicine

## 2023-01-28 DIAGNOSIS — S20211A Contusion of right front wall of thorax, initial encounter: Secondary | ICD-10-CM | POA: Diagnosis not present

## 2023-01-28 DIAGNOSIS — M546 Pain in thoracic spine: Secondary | ICD-10-CM | POA: Diagnosis not present

## 2023-01-28 DIAGNOSIS — Y92512 Supermarket, store or market as the place of occurrence of the external cause: Secondary | ICD-10-CM | POA: Diagnosis not present

## 2023-01-28 DIAGNOSIS — Z043 Encounter for examination and observation following other accident: Secondary | ICD-10-CM | POA: Diagnosis not present

## 2023-01-28 DIAGNOSIS — M25511 Pain in right shoulder: Secondary | ICD-10-CM | POA: Diagnosis not present

## 2023-01-28 DIAGNOSIS — W19XXXA Unspecified fall, initial encounter: Secondary | ICD-10-CM

## 2023-01-28 DIAGNOSIS — R918 Other nonspecific abnormal finding of lung field: Secondary | ICD-10-CM | POA: Diagnosis not present

## 2023-01-28 DIAGNOSIS — W010XXA Fall on same level from slipping, tripping and stumbling without subsequent striking against object, initial encounter: Secondary | ICD-10-CM | POA: Insufficient documentation

## 2023-01-28 DIAGNOSIS — R079 Chest pain, unspecified: Secondary | ICD-10-CM | POA: Diagnosis not present

## 2023-01-28 MED ORDER — ACETAMINOPHEN 325 MG PO TABS
650.0000 mg | ORAL_TABLET | Freq: Once | ORAL | Status: AC
  Administered 2023-01-28: 650 mg via ORAL
  Filled 2023-01-28: qty 2

## 2023-01-28 MED ORDER — TRAMADOL HCL 50 MG PO TABS: 50.0000 mg | ORAL_TABLET | Freq: Four times a day (QID) | ORAL | 0 refills | Status: AC | PRN

## 2023-01-28 MED ORDER — TRAMADOL HCL 50 MG PO TABS
50.0000 mg | ORAL_TABLET | Freq: Once | ORAL | Status: AC
  Administered 2023-01-28: 50 mg via ORAL
  Filled 2023-01-28: qty 1

## 2023-01-28 NOTE — ED Triage Notes (Signed)
Pt comes with c/o fall. Pt states she fell hour ago. Pt states she tripped while going to bathroom at [facility]. Pt denies any loc or hitting head. Pt states right sided back and spine is hurting. Pt not on thinners. Pt states right shoulder and upper chest from falling hurts too.

## 2023-01-28 NOTE — ED Provider Notes (Signed)
[facility] Provider Note    Event Date/Time   First MD Initiated Contact with Patient 01/28/23 1811     (approximate)   History   Fall   HPI  Jessica Good is a 72 y.o. female with extensive history as detailed in the chart who presents after a fall.  Patient reports she tripped in the store over a threshold.  She fell forward, complains primarily of injury to the right upper chest as well as right shoulder.  No difficulty breathing, no extremity injuries besides some bruising to the knees bilaterally however she is able to ambulate well.     Physical Exam   Triage Vital Signs: ED Triage Vitals  Encounter Vitals Group     BP 01/28/23 1747 (!) 131/93     Systolic BP Percentile --      Diastolic BP Percentile --      Pulse Rate 01/28/23 1747 98     Resp 01/28/23 1747 18     Temp 01/28/23 1747 98.5 F (36.9 C)     Temp src --      SpO2 01/28/23 1747 98 %     Weight --      Height --      Head Circumference --      Peak Flow --      Pain Score 01/28/23 1746 10     Pain Loc --      Pain Education --      Exclude from Growth Chart --     Most recent vital signs: Vitals:   01/28/23 1747 01/28/23 2051  BP: (!) 131/93 133/81  Pulse: 98 87  Resp: 18 18  Temp: 98.5 F (36.9 C)   SpO2: 98% 99%     General: Awake, no distress.  CV:  Good peripheral perfusion.  Mild tenderness to the right superior anterior chest, no bony normalities palpated, no bruising Resp:  Normal effort.  Abd:  No distention.  Soft, nontender Other:  Normal range of motion of all extremities, mild vertebral tenderness along the thoracic spine approximately T6, otherwise reassuring exam.   ED Results / Procedures / Treatments   Labs (all labs ordered are listed, but only abnormal results are displayed) Labs Reviewed - No data to display   EKG     RADIOLOGY Shoulder x-ray viewed interpret by me, no fracture    PROCEDURES:  Critical Care performed:    Procedures   MEDICATIONS ORDERED IN ED: Medications  traMADol (ULTRAM) tablet 50 mg (50 mg Oral Given 01/28/23 1857)  acetaminophen (TYLENOL) tablet 650 mg (650 mg Oral Given 01/28/23 1857)     IMPRESSION / MDM / ASSESSMENT AND PLAN / ED COURSE  I reviewed the triage vital signs and the nursing notes. Patient's presentation is most consistent with acute presentation with potential threat to life or bodily function.  Patient presents after a fall with chest wall injury as well as right shoulder injury.  X-ray of the shoulder is reassuring, sent for CT chest and T-spine to rule out fracture of the vertebrae versus rib fractures  Treated with p.o. medications at her request.  CT imaging is negative for acute abnormalities, patient is reassured appropriate for discharge at this time with outpatient follow-up as needed        FINAL CLINICAL IMPRESSION(S) / ED DIAGNOSES   Final diagnoses:  Fall, initial encounter  Chest wall contusion, right, initial encounter     Rx / DC Orders   ED  Discharge Orders          Ordered    traMADol (ULTRAM) 50 MG tablet  Every 6 hours PRN        01/28/23 2006             Note:  This document was prepared using Dragon voice recognition software and may include unintentional dictation errors.   Jene Every, MD 01/28/23 2105

## 2023-02-09 DIAGNOSIS — M25512 Pain in left shoulder: Secondary | ICD-10-CM | POA: Diagnosis not present

## 2023-03-02 ENCOUNTER — Telehealth: Payer: Self-pay | Admitting: *Deleted

## 2023-03-02 NOTE — Telephone Encounter (Signed)
Transition Care Management Unsuccessful Follow-up Telephone Call  Date of discharge and from where:  [facility]9/20/2024  Attempts:  1st Attempt  Reason for unsuccessful TCM follow-up call:  Left voice message

## 2023-03-03 ENCOUNTER — Telehealth: Payer: Self-pay | Admitting: *Deleted

## 2023-03-03 NOTE — Telephone Encounter (Signed)
Transition Care Management Follow-up Telephone Call Date of discharge and from where: [facility]  01/28/2023 How have you been since you were released from the hospital? Feeling better just sore but the dr said that would be for awhile  Any questions or concerns? No  Items Reviewed: Did the pt receive and understand the discharge instructions provided? Yes  Medications obtained and verified? No  Other? No  Any new allergies since your discharge? No  Dietary orders reviewed? No Do you have support at home? Yes     Follow up appointments reviewed:  PCP Hospital f/u appt confirmed? Yes  Has already been to see pcp to be checked out  Are transportation arrangements needed? No   has no medication needs either  If their condition worsens, is the pt aware to call PCP or go to the Emergency Dept.? Yes Was the patient provided with contact information for the PCP's office or ED? Yes Was to pt encouraged to call back with questions or concerns? Yes   Dione Booze [facility]  Population Health Careguide  Direct Dial: [phone] Website: [email or web address]

## 2023-03-10 IMAGING — US US FNA BIOPSY THYROID 1ST LESION
1 series · 13 of 21 positions shown · non-contrast
Comparison: US Thyroid, 07/24/21

MEDICATIONS:
None

COMPLICATIONS:
None immediate.

INDICATION: Indeterminate thyroid nodule

EXAM:
ULTRASOUND GUIDED FINE NEEDLE ASPIRATION OF INDETERMINATE THYROID
NODULES
TECHNIQUE: Informed written consent was obtained from the patient after a
discussion of the risks, benefits and alternatives to treatment.
Questions regarding the procedure were encouraged and answered. A
timeout was performed prior to the initiation of the procedure.

[Series 1: us fna biopsy thyroid 1st lesion · 0.10mm/px · 21 acquisitions, 13 frames shown]
[im 1/21]
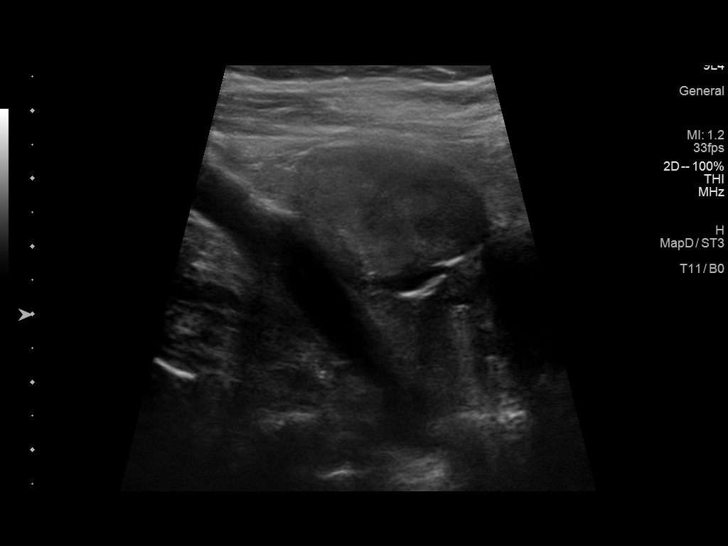
[im 3/21]
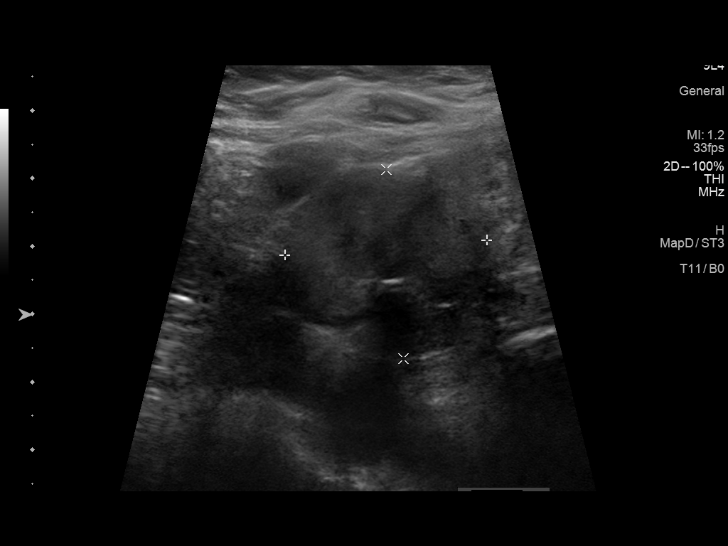
[im 5/21]
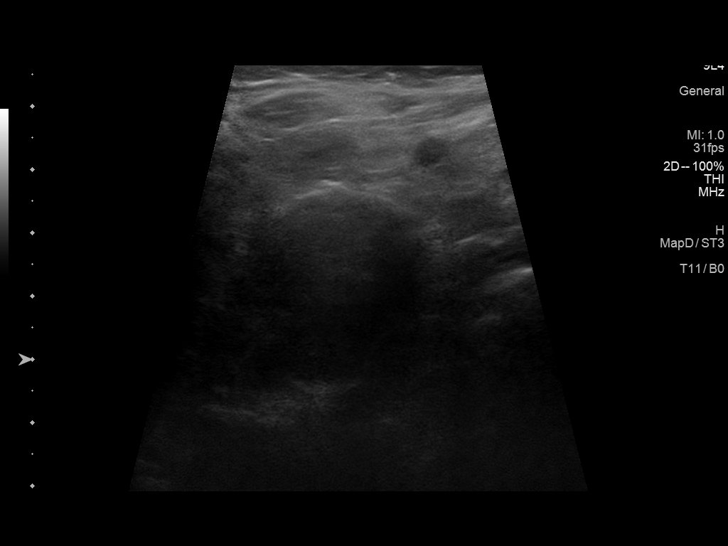
[im 6/21]
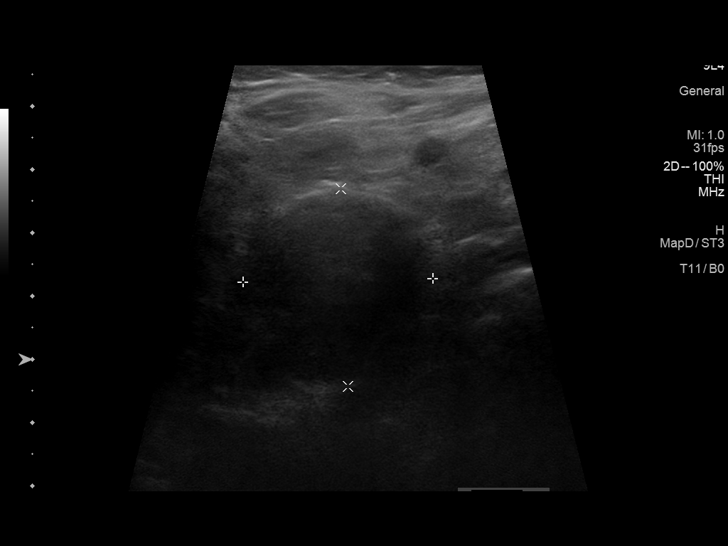
[im 8/21]
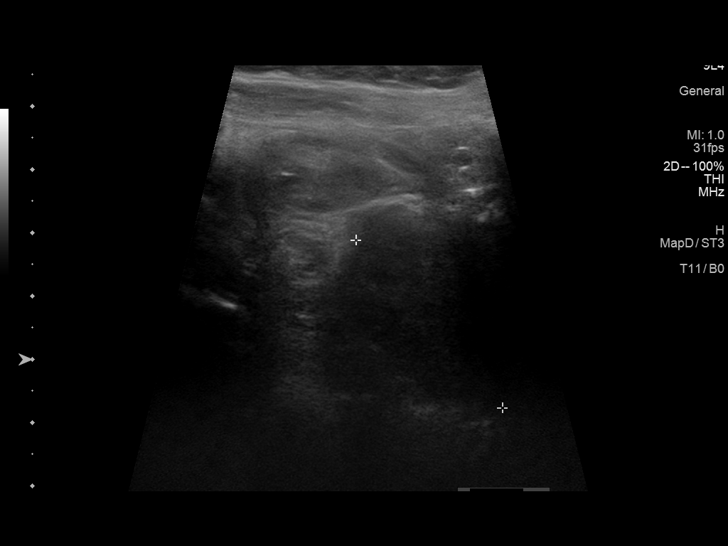
[im 9/21]
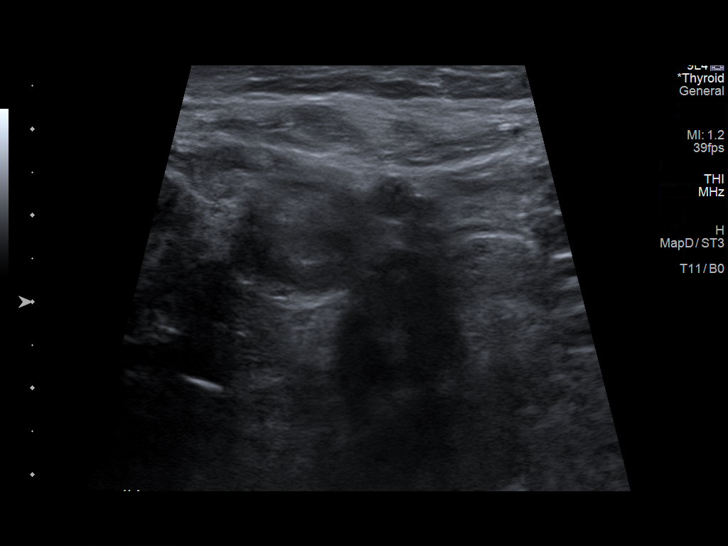
[im 11/21]
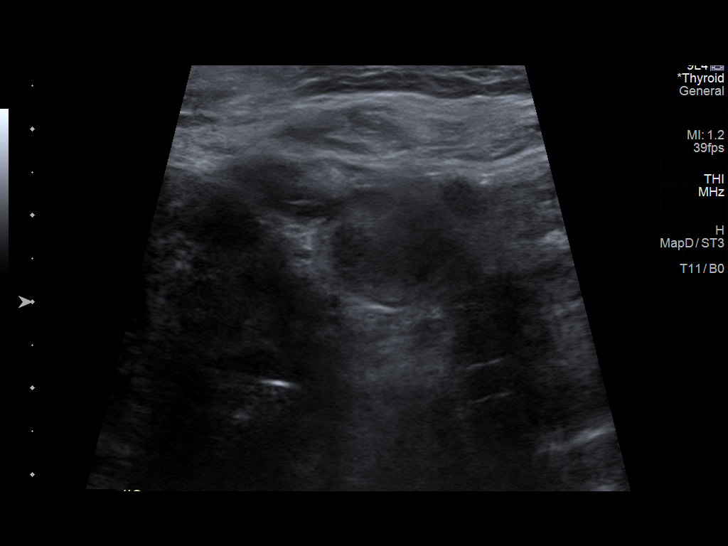
[im 13/21]
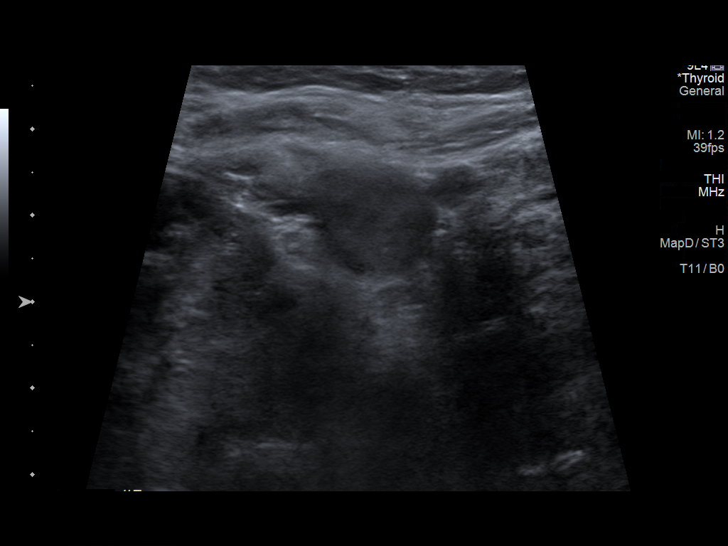
[im 14/21]
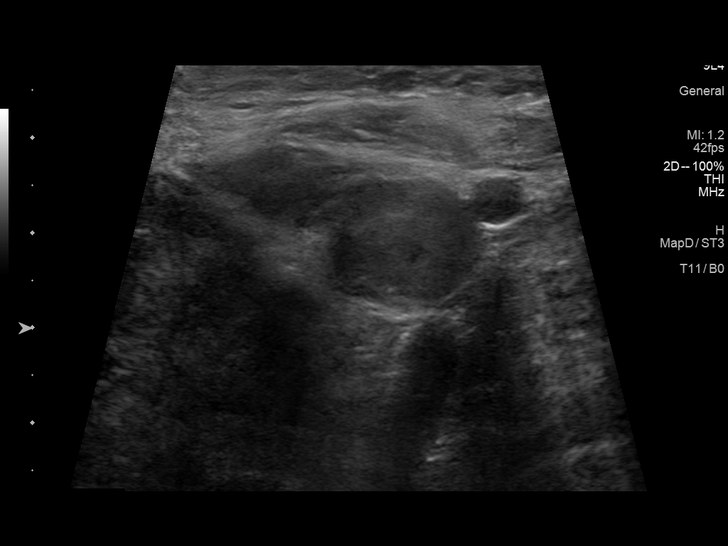
[im 16/21]
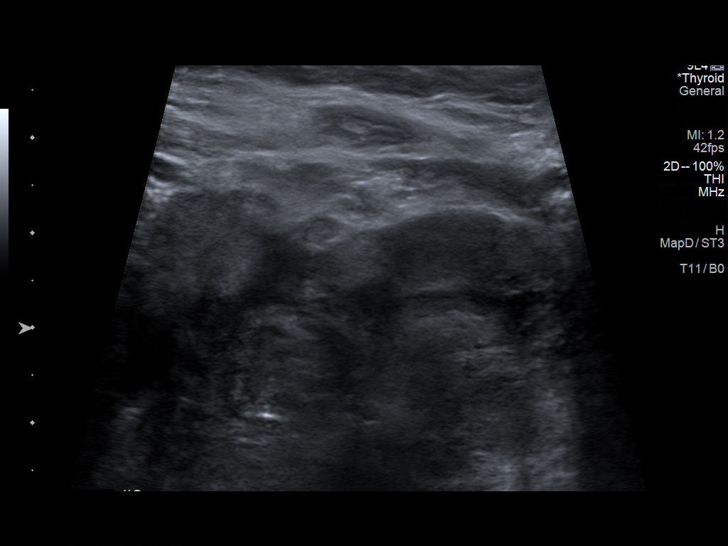
[im 17/21]
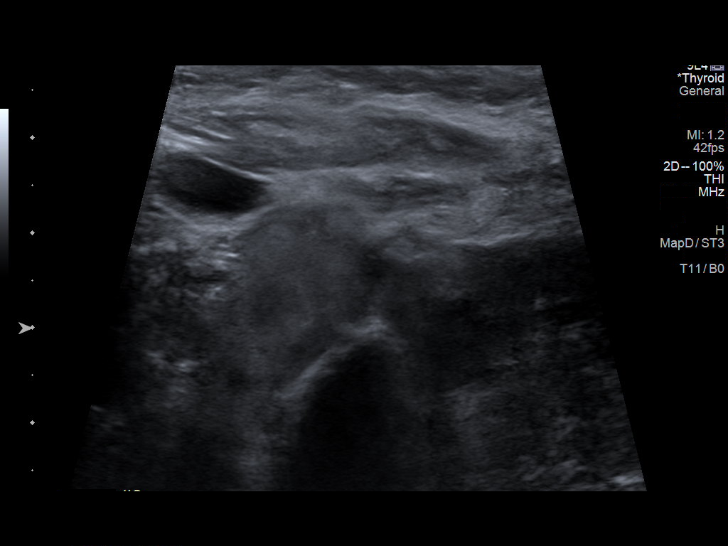
[im 19/21]
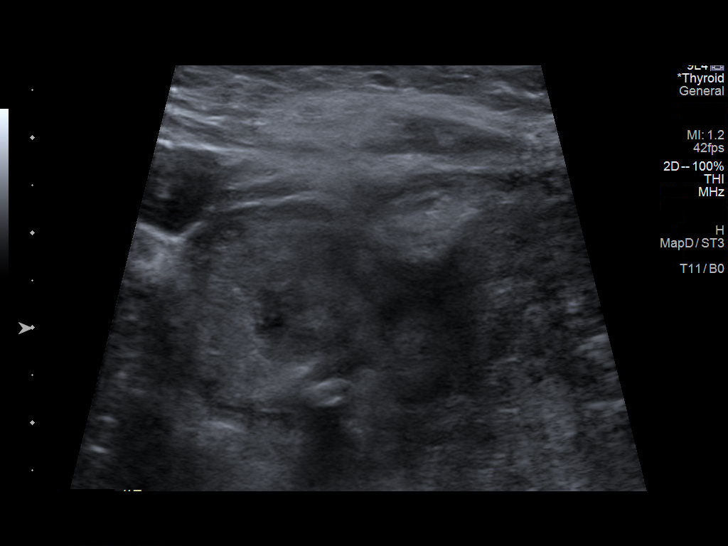
[im 21/21]
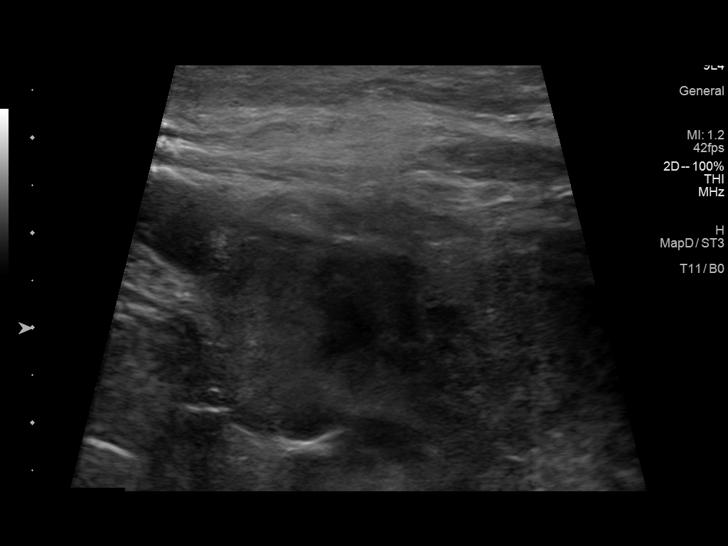

[13 of 21 positions shown; findings below may reference images not displayed]

Pre-procedural ultrasound scanning demonstrated unchanged size and
appearance of the indeterminate nodules within the thyroid.

The procedure was planned. The neck was prepped in the usual sterile
fashion, and a sterile drape was applied covering the operative
field. A timeout was performed prior to the initiation of the
procedure. Local anesthesia was provided with 1% lidocaine.

Under direct ultrasound guidance, 5 FNA biopsies were performed of
the right superior nodule with a 27 gauge needle. Multiple
ultrasound images were saved for procedural documentation purposes.
The samples were prepared and submitted to pathology. Two of these
specimens were reserved for Afirma testing.

Under direct ultrasound guidance, 5 FNA biopsies were performed of
the left inferior nodule with a 27 gauge needle. Multiple ultrasound
images were saved for procedural documentation purposes. The samples
were prepared and submitted to pathology. Two of these specimens
were reserved for Afirma testing.

Limited post procedural scanning was negative for hematoma or
additional complication. Dressings were placed. The patient
tolerated the above procedures procedure well without immediate
postprocedural complication.
FINDINGS: Nodule reference number based on prior diagnostic ultrasound: 1

Maximum size: 2.5cm

Location: Right; Superior

ACR TI-RADS risk category: TR4 (4-6 points)

Reason for biopsy: meets ACR TI-RADS criteria

_________________________________________________________

Nodule reference number based on prior diagnostic ultrasound: 5

Maximum size: 1.9cm

Location: Left; Inferior

ACR TI-RADS risk category: TR4 (4-6 points)

Reason for biopsy: meets ACR TI-RADS criteria

Ultrasound imaging confirms appropriate placement of the needles
within the thyroid nodule.
IMPRESSION: Technically successful ultrasound guided fine needle aspiration of
thyroid nodules #1 and #5 as described above.

Performed and read by Noel Flores, [place]

## 2023-03-10 IMAGING — US US FNA BIOPSY THYROID 1ST LESION
1 series · 13 of 21 positions shown · non-contrast
Comparison: US Thyroid, 07/24/21

MEDICATIONS:
None

COMPLICATIONS:
None immediate.

INDICATION: Indeterminate thyroid nodule

EXAM:
ULTRASOUND GUIDED FINE NEEDLE ASPIRATION OF INDETERMINATE THYROID
NODULES
TECHNIQUE: Informed written consent was obtained from the patient after a
discussion of the risks, benefits and alternatives to treatment.
Questions regarding the procedure were encouraged and answered. A
timeout was performed prior to the initiation of the procedure.

[Series 1: us fna biopsy thyroid 1st lesion · 0.10mm/px · 21 acquisitions, 13 frames shown]
[im 1/21]
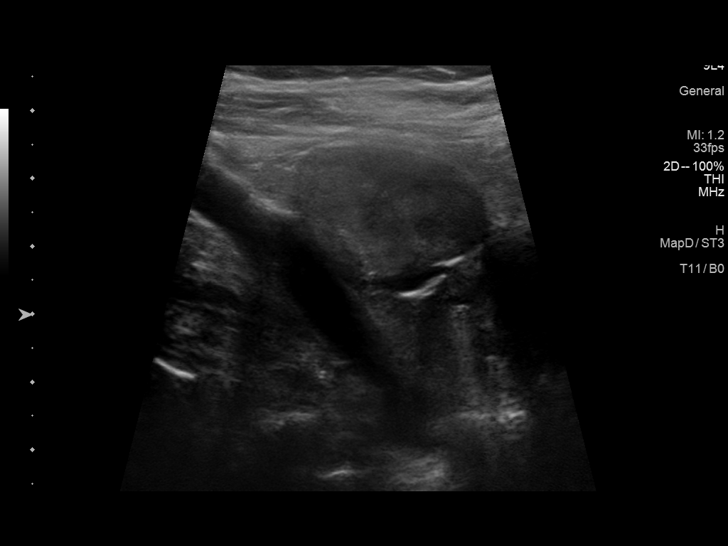
[im 3/21]
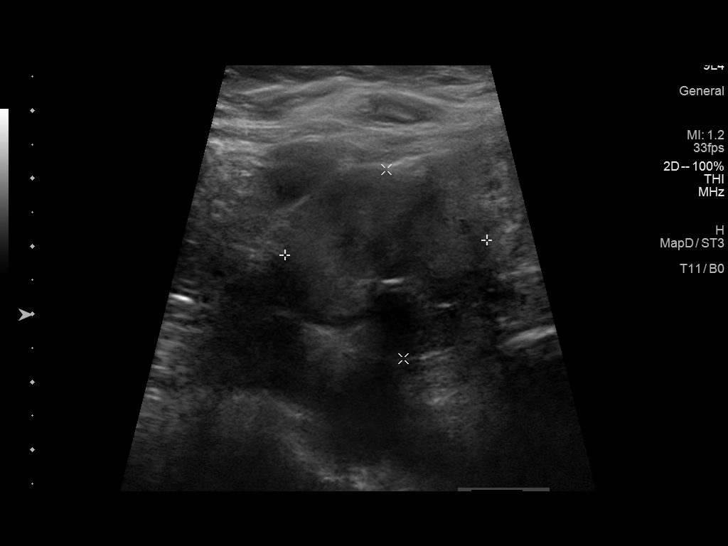
[im 5/21]
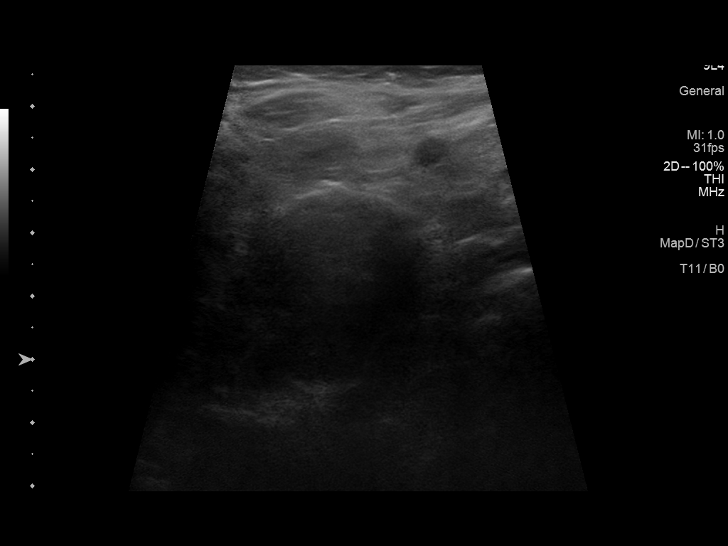
[im 6/21]
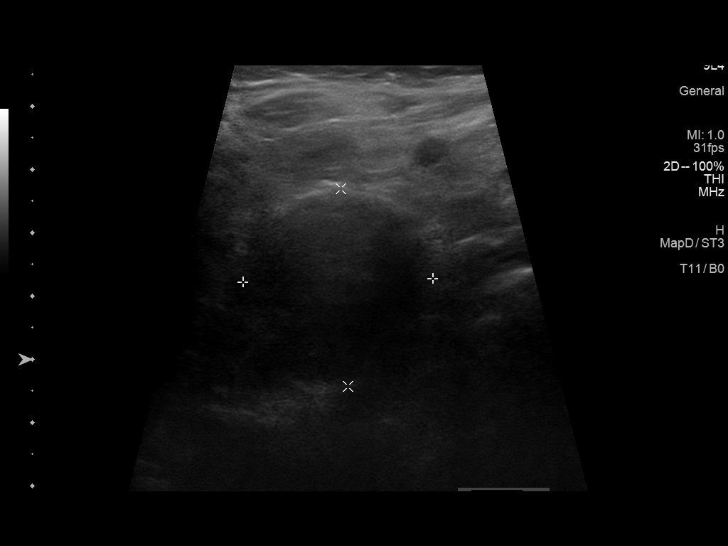
[im 8/21]
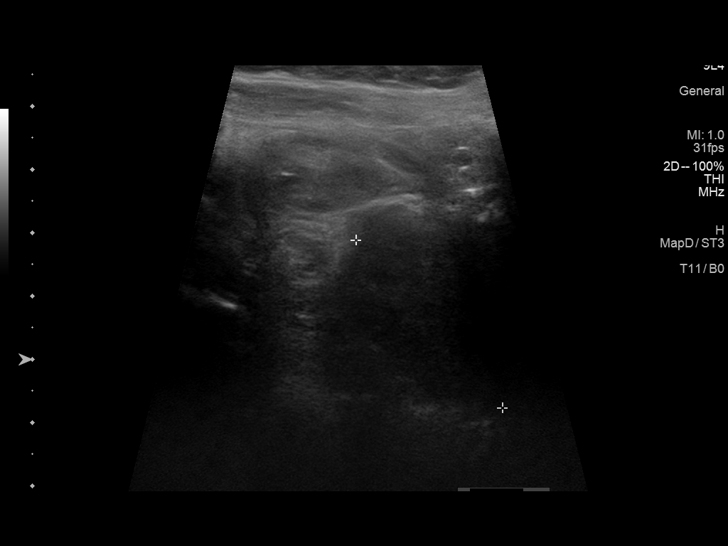
[im 9/21]
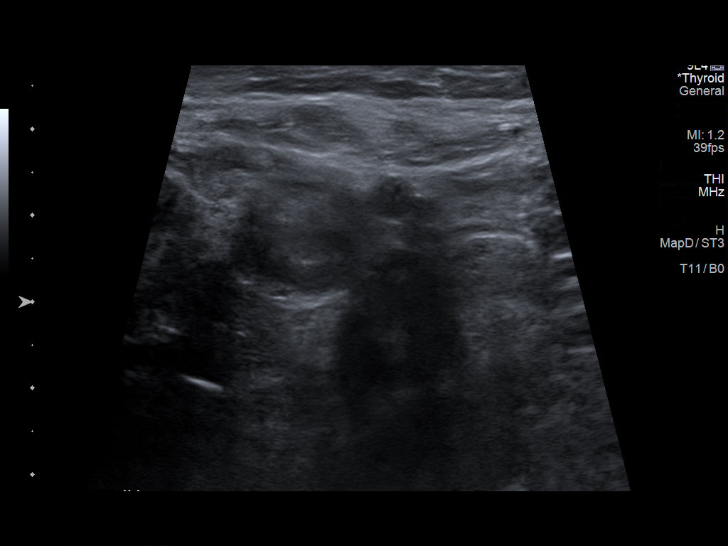
[im 11/21]
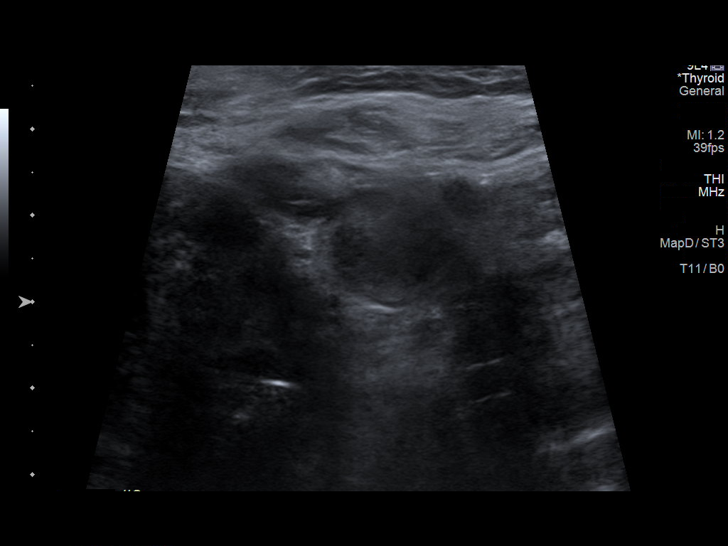
[im 13/21]
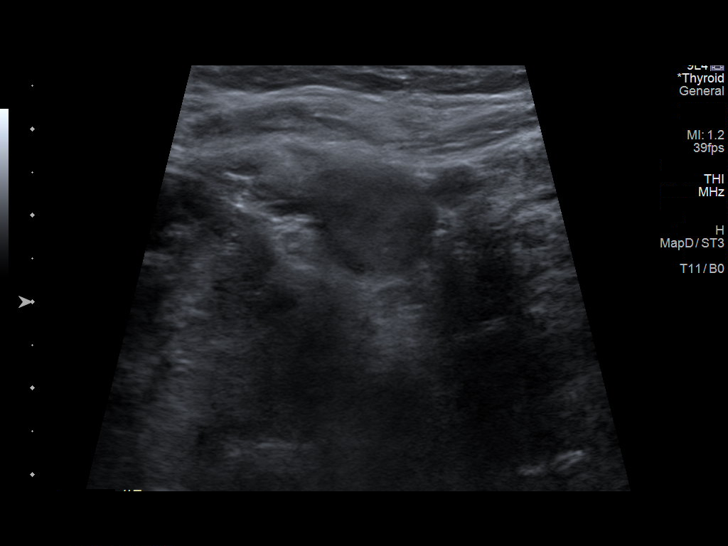
[im 14/21]
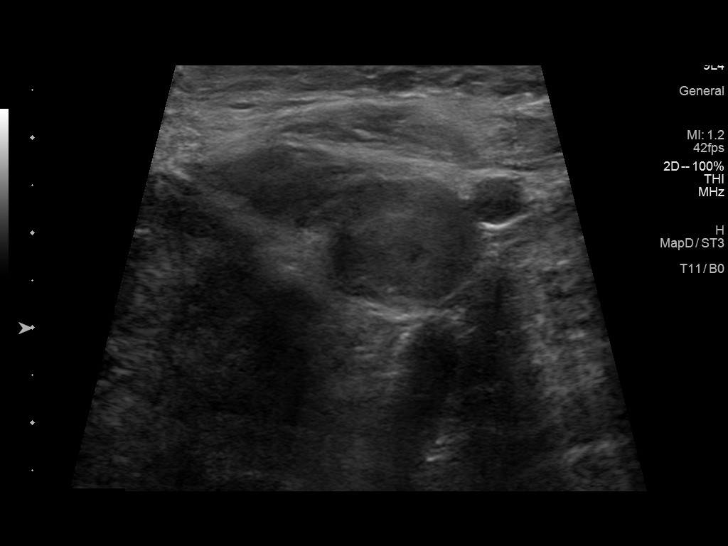
[im 16/21]
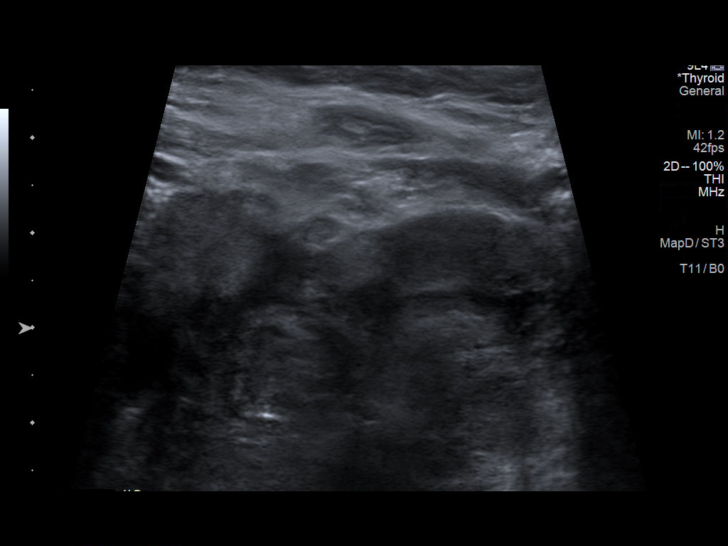
[im 17/21]
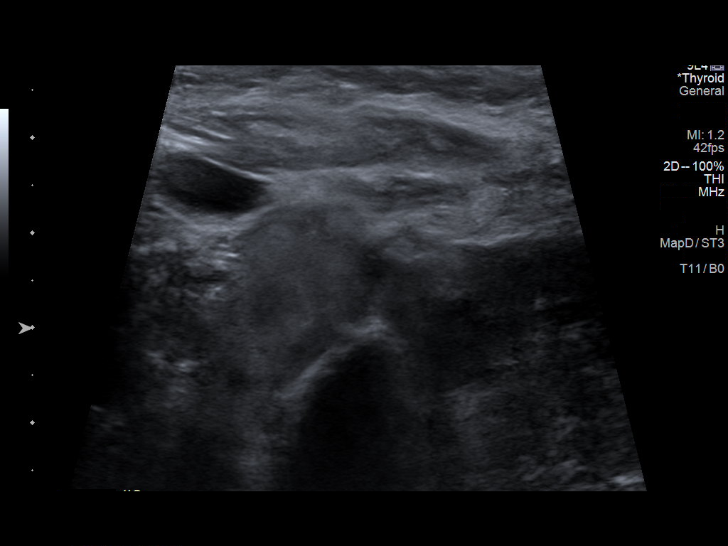
[im 19/21]
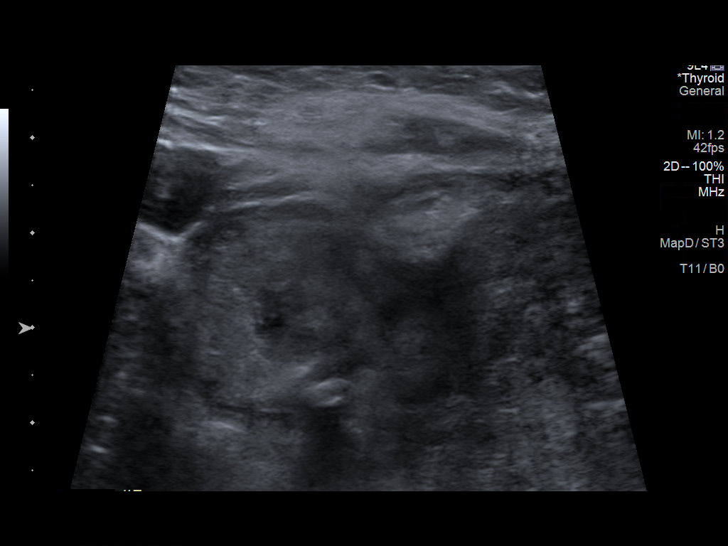
[im 21/21]
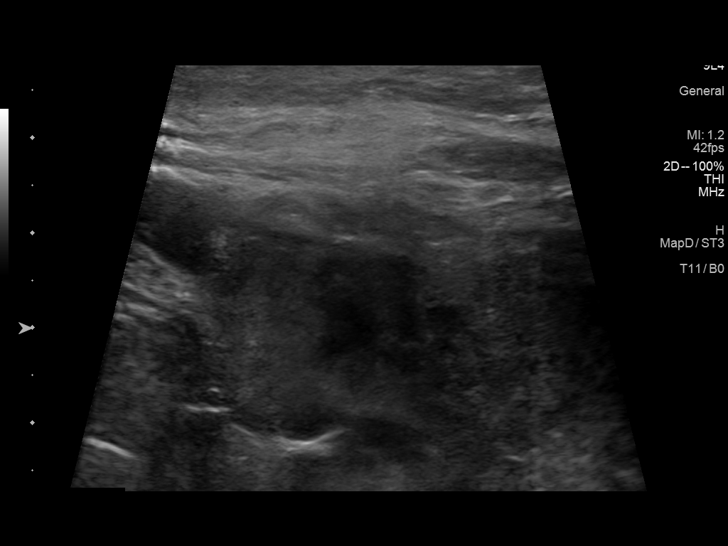

[13 of 21 positions shown; findings below may reference images not displayed]

Pre-procedural ultrasound scanning demonstrated unchanged size and
appearance of the indeterminate nodules within the thyroid.

The procedure was planned. The neck was prepped in the usual sterile
fashion, and a sterile drape was applied covering the operative
field. A timeout was performed prior to the initiation of the
procedure. Local anesthesia was provided with 1% lidocaine.

Under direct ultrasound guidance, 5 FNA biopsies were performed of
the right superior nodule with a 27 gauge needle. Multiple
ultrasound images were saved for procedural documentation purposes.
The samples were prepared and submitted to pathology. Two of these
specimens were reserved for Afirma testing.

Under direct ultrasound guidance, 5 FNA biopsies were performed of
the left inferior nodule with a 27 gauge needle. Multiple ultrasound
images were saved for procedural documentation purposes. The samples
were prepared and submitted to pathology. Two of these specimens
were reserved for Afirma testing.

Limited post procedural scanning was negative for hematoma or
additional complication. Dressings were placed. The patient
tolerated the above procedures procedure well without immediate
postprocedural complication.
FINDINGS: Nodule reference number based on prior diagnostic ultrasound: 1

Maximum size: 2.5cm

Location: Right; Superior

ACR TI-RADS risk category: TR4 (4-6 points)

Reason for biopsy: meets ACR TI-RADS criteria

_________________________________________________________

Nodule reference number based on prior diagnostic ultrasound: 5

Maximum size: 1.9cm

Location: Left; Inferior

ACR TI-RADS risk category: TR4 (4-6 points)

Reason for biopsy: meets ACR TI-RADS criteria

Ultrasound imaging confirms appropriate placement of the needles
within the thyroid nodule.
IMPRESSION: Technically successful ultrasound guided fine needle aspiration of
thyroid nodules #1 and #5 as described above.

Performed and read by Noel Flores, [place]

## 2023-06-17 DIAGNOSIS — R42 Dizziness and giddiness: Secondary | ICD-10-CM | POA: Diagnosis not present

## 2023-06-17 DIAGNOSIS — E0590 Thyrotoxicosis, unspecified without thyrotoxic crisis or storm: Secondary | ICD-10-CM | POA: Diagnosis not present

## 2023-06-17 DIAGNOSIS — E1169 Type 2 diabetes mellitus with other specified complication: Secondary | ICD-10-CM | POA: Diagnosis not present

## 2023-06-17 DIAGNOSIS — M25561 Pain in right knee: Secondary | ICD-10-CM | POA: Diagnosis not present

## 2023-06-17 DIAGNOSIS — E049 Nontoxic goiter, unspecified: Secondary | ICD-10-CM | POA: Diagnosis not present

## 2023-06-17 DIAGNOSIS — I10 Essential (primary) hypertension: Secondary | ICD-10-CM | POA: Diagnosis not present

## 2023-06-17 DIAGNOSIS — K219 Gastro-esophageal reflux disease without esophagitis: Secondary | ICD-10-CM | POA: Diagnosis not present

## 2023-06-17 DIAGNOSIS — N390 Urinary tract infection, site not specified: Secondary | ICD-10-CM | POA: Diagnosis not present

## 2023-06-17 DIAGNOSIS — R2681 Unsteadiness on feet: Secondary | ICD-10-CM | POA: Diagnosis not present

## 2023-06-17 DIAGNOSIS — M810 Age-related osteoporosis without current pathological fracture: Secondary | ICD-10-CM | POA: Diagnosis not present

## 2023-06-27 DIAGNOSIS — M25561 Pain in right knee: Secondary | ICD-10-CM | POA: Diagnosis not present

## 2023-06-27 DIAGNOSIS — M25562 Pain in left knee: Secondary | ICD-10-CM | POA: Diagnosis not present

## 2023-07-18 DIAGNOSIS — M25561 Pain in right knee: Secondary | ICD-10-CM | POA: Diagnosis not present

## 2023-07-18 DIAGNOSIS — M6281 Muscle weakness (generalized): Secondary | ICD-10-CM | POA: Diagnosis not present

## 2023-07-18 DIAGNOSIS — M25562 Pain in left knee: Secondary | ICD-10-CM | POA: Diagnosis not present

## 2023-09-23 DIAGNOSIS — E049 Nontoxic goiter, unspecified: Secondary | ICD-10-CM | POA: Diagnosis not present

## 2023-09-23 DIAGNOSIS — R2681 Unsteadiness on feet: Secondary | ICD-10-CM | POA: Diagnosis not present

## 2023-09-23 DIAGNOSIS — M25561 Pain in right knee: Secondary | ICD-10-CM | POA: Diagnosis not present

## 2023-09-23 DIAGNOSIS — E1169 Type 2 diabetes mellitus with other specified complication: Secondary | ICD-10-CM | POA: Diagnosis not present

## 2023-09-23 DIAGNOSIS — Z860100 Personal history of colon polyps, unspecified: Secondary | ICD-10-CM | POA: Diagnosis not present

## 2023-09-23 DIAGNOSIS — I10 Essential (primary) hypertension: Secondary | ICD-10-CM | POA: Diagnosis not present

## 2023-09-26 DIAGNOSIS — M8588 Other specified disorders of bone density and structure, other site: Secondary | ICD-10-CM | POA: Diagnosis not present

## 2023-09-26 DIAGNOSIS — R29890 Loss of height: Secondary | ICD-10-CM | POA: Diagnosis not present

## 2023-12-07 DIAGNOSIS — Z961 Presence of intraocular lens: Secondary | ICD-10-CM | POA: Diagnosis not present

## 2023-12-07 DIAGNOSIS — E119 Type 2 diabetes mellitus without complications: Secondary | ICD-10-CM | POA: Diagnosis not present

## 2023-12-07 DIAGNOSIS — H52203 Unspecified astigmatism, bilateral: Secondary | ICD-10-CM | POA: Diagnosis not present

## 2023-12-21 DIAGNOSIS — M810 Age-related osteoporosis without current pathological fracture: Secondary | ICD-10-CM | POA: Diagnosis not present

## 2023-12-21 DIAGNOSIS — R2681 Unsteadiness on feet: Secondary | ICD-10-CM | POA: Diagnosis not present

## 2023-12-21 DIAGNOSIS — M25511 Pain in right shoulder: Secondary | ICD-10-CM | POA: Diagnosis not present

## 2023-12-21 DIAGNOSIS — E041 Nontoxic single thyroid nodule: Secondary | ICD-10-CM | POA: Diagnosis not present

## 2023-12-21 DIAGNOSIS — I10 Essential (primary) hypertension: Secondary | ICD-10-CM | POA: Diagnosis not present

## 2023-12-21 DIAGNOSIS — E1169 Type 2 diabetes mellitus with other specified complication: Secondary | ICD-10-CM | POA: Diagnosis not present

## 2023-12-21 DIAGNOSIS — M2550 Pain in unspecified joint: Secondary | ICD-10-CM | POA: Diagnosis not present

## 2023-12-21 DIAGNOSIS — M25561 Pain in right knee: Secondary | ICD-10-CM | POA: Diagnosis not present

## 2023-12-23 ENCOUNTER — Telehealth (HOSPITAL_COMMUNITY): Payer: Self-pay | Admitting: Pharmacy Technician

## 2023-12-23 NOTE — Telephone Encounter (Signed)
 Auth Submission: APPROVED Site of care: [facility] Payer: [employer] Medicare Medication & CPT/J Code(s) submitted: Prolia (Denosumab) [id number] Diagnosis Code: M81.0 Route of submission (phone, fax, portal): portal Phone # Fax # Auth type: Buy/Bill HB Units/visits requested: 60mg  x 2 doses, q 6 months Reference number: [id number] Approval from: 12/23/23 to 12/22/24      Dagoberto Armour, CPhT [facility] Phone: [phone] 12/23/2023

## 2024-01-03 DIAGNOSIS — M25512 Pain in left shoulder: Secondary | ICD-10-CM | POA: Diagnosis not present

## 2024-01-03 DIAGNOSIS — M810 Age-related osteoporosis without current pathological fracture: Secondary | ICD-10-CM | POA: Diagnosis not present

## 2024-01-03 DIAGNOSIS — M25511 Pain in right shoulder: Secondary | ICD-10-CM | POA: Diagnosis not present

## 2024-01-03 DIAGNOSIS — M542 Cervicalgia: Secondary | ICD-10-CM | POA: Diagnosis not present

## 2024-01-18 ENCOUNTER — Other Ambulatory Visit: Payer: Self-pay

## 2024-01-18 ENCOUNTER — Other Ambulatory Visit: Payer: Self-pay | Admitting: *Deleted

## 2024-01-18 ENCOUNTER — Ambulatory Visit: Admitting: Family Medicine

## 2024-01-18 VITALS — BP 127/47 | Ht 61.0 in | Wt 257.0 lb

## 2024-01-18 DIAGNOSIS — M810 Age-related osteoporosis without current pathological fracture: Secondary | ICD-10-CM | POA: Diagnosis not present

## 2024-01-18 MED ORDER — DENOSUMAB 60 MG/ML ~~LOC~~ SOSY
60.0000 mg | PREFILLED_SYRINGE | Freq: Once | SUBCUTANEOUS | 0 refills | Status: AC
  Filled 2024-01-18: qty 1, 1d supply, fill #0

## 2024-01-18 NOTE — Patient Instructions (Signed)
 Your labs look good. We will confirm approval for prolia  and call you to come in and restart that. Calcium and vitamin D  supplementation as you have been. Exercise is important - cardio like walking as well as a couple days a week of strengthening.

## 2024-01-18 NOTE — Progress Notes (Signed)
 PCP: Valentin Skates, DO  Subjective:   HPI: Patient is a 73 y.o. female here for osteoporosis.  Patient here as referral from Dr. Orpha to restart Prolia .  She has taken this medication off and on for years but last dose was about 1 year ago.  Tolerated this well.  Prior treatment: prolia . Bisphosphonates for at least a few years History of Hip, Spine, or Wrist Fracture: no Heart disease or stroke: no Cancer: no Kidney Disease: no Gastric/Peptic Ulcer: no Gastric bypass surgery: no Severe GERD: no History of seizures: no Age at Menopause: 67 Calcium intake: 600mg  daily (was advised previously to decrease from 1200 based on prior labwork) Vitamin D  intake: 1000 international units daily Hormone replacement therapy: no Smoking history: never Alcohol: none Exercise: none currently Major dental work in past year: no Parents with hip/spine fracture: no   Past Medical History:  Diagnosis Date   Adenomatous colon polyp    Allergy    SNEEZING   Anemia yrs ago   PMH   Anxiety    Back pain    Cataract    Constipation due to pain medication    Diabetes mellitus without complication (HCC)    NO MEDS AS OF 01/25/22   Edema of both lower extremities    Edema of lower extremity    Takes lasix  PRN   Gallstones    Gastritis    GERD (gastroesophageal reflux disease)    with meds   Hiatal hernia    History of recurrent UTIs    Hyperlipidemia    Hypertension    Hypothyroidism    IBS (irritable bowel syndrome)    Interstitial cystitis    Joint pain    Loose, teeth    in the front of mouth   Osteoarthritis    Osteoporosis    osteoarthritis   Pre-diabetes    Pseudogout    SOBOE (shortness of breath on exertion)    Urgency of urination     Current Outpatient Medications on File Prior to Visit  Medication Sig Dispense Refill   acetaminophen  (TYLENOL ) 500 MG tablet Take 1,000 mg by mouth every 8 (eight) hours as needed for moderate pain.     amoxicillin  (AMOXIL ) 500 MG  capsule Take 1 capsule by mouth daily. To prevent UTI's     aspirin  81 MG tablet Take 81 mg by mouth daily.     Calcium Carb-Cholecalciferol  600-10 MG-MCG TABS Take 1 tablet by mouth daily.     cholecalciferol  (VITAMIN D ) 1000 UNITS tablet Take 1,000 Units by mouth daily.     citalopram (CELEXA) 40 MG tablet Take 40 mg by mouth at bedtime.     cyclobenzaprine  (FLEXERIL ) 5 MG tablet Take 5 mg by mouth 3 (three) times daily as needed for muscle spasms.     diclofenac Sodium (VOLTAREN) 1 % GEL Apply 2 g topically 3 (three) times daily as needed (pain).     famotidine  (PEPCID ) 40 MG tablet Take 1 tablet (40 mg total) by mouth 2 (two) times daily. (Patient not taking: Reported on 02/04/2022) 60 tablet 0   Insulin  Pen Needle 31G X 5 MM MISC Use once daily as directed 30 each 0   liraglutide  (VICTOZA ) 18 MG/3ML SOPN Inject 0.6 mg into the skin daily. (Patient not taking: Reported on 02/04/2022) 9 mL 0   losartan-hydrochlorothiazide (HYZAAR) 50-12.5 MG tablet Take 1 tablet by mouth daily.     Multiple Vitamin (MULTIVITAMIN PO) Take 1 tablet by mouth daily.     naproxen (  NAPROSYN) 500 MG tablet Take 500 mg by mouth 2 (two) times daily as needed for moderate pain.     OVER THE COUNTER MEDICATION Take 1 tablet by mouth at bedtime as needed (sleep). Midnight Sleep Supplement     PROLIA  60 MG/ML SOSY injection Inject into the skin. Injection done every 6 months     thiamine (VITAMIN B-1) 100 MG tablet Take 100 mg by mouth daily.     traMADol  (ULTRAM ) 50 MG tablet Take 1 tablet (50 mg total) by mouth every 6 (six) hours as needed. 20 tablet 0   vitamin C (ASCORBIC ACID) 500 MG tablet Take 500 mg by mouth daily.     No current facility-administered medications on file prior to visit.    Past Surgical History:  Procedure Laterality Date   ANKLE SURGERY Right    BREAST LUMPECTOMY Left 1996   fibroid   cataracts Bilateral    lens implanted   COLONOSCOPY W/ POLYPECTOMY     COLONOSCOPY WITH PROPOFOL  N/A  09/02/2016   Procedure: COLONOSCOPY WITH PROPOFOL ;  Surgeon: Toribio SHAUNNA Cedar, MD;  Location: [facility];  Service: Endoscopy;  Laterality: N/A;   COLONOSCOPY WITH PROPOFOL  N/A 02/11/2022   Procedure: COLONOSCOPY WITH PROPOFOL ;  Surgeon: Albertus Gordy HERO, MD;  Location: [facility];  Service: Gastroenterology;  Laterality: N/A;   EUS N/A 02/06/2015   Procedure: UPPER ENDOSCOPIC ULTRASOUND (EUS) LINEAR;  Surgeon: Toribio SHAUNNA Cedar, MD;  Location: [facility];  Service: Endoscopy;  Laterality: N/A;   EUS N/A 09/02/2016   Procedure: UPPER ENDOSCOPIC ULTRASOUND (EUS) LINEAR;  Surgeon: Toribio SHAUNNA Cedar, MD;  Location: [facility];  Service: Endoscopy;  Laterality: N/A;   EYE SURGERY Bilateral    lasix  14 yrs ago   JOINT REPLACEMENT     PARTIAL KNEE ARTHROPLASTY Left 11/26/2014   Procedure: LEFT UNICOMPARTMENTAL KNEE;  Surgeon: Evalene JONETTA Chancy, MD;  Location: [facility];  Service: Orthopedics;  Laterality: Left;   POLYPECTOMY  02/11/2022   Procedure: POLYPECTOMY;  Surgeon: Albertus Gordy HERO, MD;  Location: [facility];  Service: Gastroenterology;;   TOTAL KNEE ARTHROPLASTY Right 04/09/2014   DR MURPHY   TOTAL KNEE ARTHROPLASTY Right 04/09/2014   Procedure: TOTAL KNEE ARTHROPLASTY;  Surgeon: Evalene JONETTA Chancy, MD;  Location: [facility] OR;  Service: Orthopedics;  Laterality: Right;   TOTAL KNEE REVISION Left 11/03/2015   Procedure: TOTAL KNEE REVISION;  Surgeon: Evalene JONETTA Chancy, MD;  Location: [facility] OR;  Service: Orthopedics;  Laterality: Left;    Allergies  Allergen Reactions   Pantoprazole  Rash   Demerol [Meperidine] Other (See Comments)    caused excessive sleepiness   Latex Rash    Pt. Stated she has a  latex allergy and it gives her a rash.   Sulfa Antibiotics Other (See Comments)    : Flu-like symptoms   Adhesive [Tape] Rash    Reaction to bandaids    BP (!) 127/47   Ht 5' 1 (1.549 m)   Wt 257 lb (116.6 kg)   BMI 48.56 kg/m       No data to display              No data to display               Objective:  Physical Exam:  Gen: NAD, comfortable in exam room  Dexa 08/17/21: L neck -2.3, Spine - 1.3, Total femur -1.3 (all while on prolia ) Labs 12/21/23: Vitamin D  61.4, Ca 9.7, Cr 0.8 with GFR 70.3, CBC normal  Assessment & Plan:  1. Osteoporosis - previously took bisphosphonates.  While on prolia  bone density improved with worst T score -2.3 left femoral neck.  Over 2 years since last Dexa according to records - will recheck.  Labwork done in past month normal.  Will continue her prolia .  Calcium, vitamin D  supplementation, exercise.    Total visit time 25 minutes including documentation

## 2024-01-19 ENCOUNTER — Telehealth (HOSPITAL_COMMUNITY): Payer: Self-pay | Admitting: Pharmacy Technician

## 2024-01-19 ENCOUNTER — Other Ambulatory Visit (HOSPITAL_COMMUNITY): Payer: Self-pay

## 2024-01-19 ENCOUNTER — Other Ambulatory Visit: Payer: Self-pay

## 2024-01-19 NOTE — Telephone Encounter (Signed)
 Hello!  [facility] has a prescription on file for Prolia  for this patient- the patient's insurance now prefers the biosimilar agent Jub[employer]. An updated prescription will be required to make this adjustment. No PA is required and patient's copay is $870.00.   Patient is not enrolled in Medicare repayment plan, but eligible- she can enroll by calling her plan.   *Looks like infusion center obtained a medical benefit PA for Prolia  on 12/23/23

## 2024-01-20 ENCOUNTER — Other Ambulatory Visit: Payer: Self-pay

## 2024-01-20 ENCOUNTER — Other Ambulatory Visit: Payer: Self-pay | Admitting: Pharmacy Technician

## 2024-01-25 ENCOUNTER — Other Ambulatory Visit: Payer: Self-pay

## 2024-01-26 ENCOUNTER — Other Ambulatory Visit: Payer: Self-pay

## 2024-01-27 ENCOUNTER — Other Ambulatory Visit: Payer: Self-pay

## 2024-01-30 DIAGNOSIS — M2550 Pain in unspecified joint: Secondary | ICD-10-CM | POA: Diagnosis not present

## 2024-01-30 DIAGNOSIS — H6121 Impacted cerumen, right ear: Secondary | ICD-10-CM | POA: Diagnosis not present

## 2024-02-07 ENCOUNTER — Other Ambulatory Visit: Payer: Self-pay

## 2024-02-10 ENCOUNTER — Telehealth: Payer: Self-pay | Admitting: *Deleted

## 2024-02-10 NOTE — Telephone Encounter (Addendum)
 Medical [employer] and [employer]  Patient is ready for scheduling on or after: 02/13/24   Out-of-pocket cost due at time of visit: $378 Up to a $8,900 OOP max; $175.84 met)  Primary: [employer] Prolia  co-insurance: 20% (approximately $353) Admin fee co-insurance: 20% (approximately $25)  Deductible: n/a  Prior Auth: Approved Auth #: [id number] Valid: 02/13/24 - 02/12/25    ** This summary of benefits is an estimation of the patient's out-of-pocket cost. Exact cost may vary based on individual plan coverage.

## 2024-02-20 ENCOUNTER — Other Ambulatory Visit: Payer: Self-pay

## 2024-02-20 NOTE — Progress Notes (Signed)
 Patient's out of pocket through medical benefits is cheaper. Office will do buy and bill. Dis-enrolling.

## 2024-03-10 ENCOUNTER — Ambulatory Visit (HOSPITAL_BASED_OUTPATIENT_CLINIC_OR_DEPARTMENT_OTHER): Admission: RE | Admit: 2024-03-10 | Source: Ambulatory Visit

## 2024-04-09 ENCOUNTER — Encounter: Payer: Self-pay | Admitting: *Deleted

## 2024-09-19 ENCOUNTER — Ambulatory Visit: Admitting: "Endocrinology
